# Patient Record
Sex: Female | Born: 1945 | Race: White | Hispanic: No | Marital: Married | Smoking: Never smoker
Health system: Southern US, Community
[De-identification: ages and names within clinical notes are randomized; demographics above are authoritative.]

## PROBLEM LIST (undated history)

## (undated) DIAGNOSIS — G4733 Obstructive sleep apnea (adult) (pediatric): Secondary | ICD-10-CM

## (undated) DIAGNOSIS — J449 Chronic obstructive pulmonary disease, unspecified: Secondary | ICD-10-CM

## (undated) DIAGNOSIS — G4730 Sleep apnea, unspecified: Secondary | ICD-10-CM

## (undated) DIAGNOSIS — E785 Hyperlipidemia, unspecified: Secondary | ICD-10-CM

## (undated) DIAGNOSIS — L501 Idiopathic urticaria: Secondary | ICD-10-CM

## (undated) DIAGNOSIS — K5730 Diverticulosis of large intestine without perforation or abscess without bleeding: Secondary | ICD-10-CM

## (undated) DIAGNOSIS — L309 Dermatitis, unspecified: Secondary | ICD-10-CM

## (undated) DIAGNOSIS — K589 Irritable bowel syndrome without diarrhea: Secondary | ICD-10-CM

## (undated) DIAGNOSIS — I10 Essential (primary) hypertension: Secondary | ICD-10-CM

## (undated) DIAGNOSIS — F419 Anxiety disorder, unspecified: Secondary | ICD-10-CM

## (undated) DIAGNOSIS — N289 Disorder of kidney and ureter, unspecified: Secondary | ICD-10-CM

## (undated) DIAGNOSIS — D649 Anemia, unspecified: Secondary | ICD-10-CM

## (undated) DIAGNOSIS — C4490 Unspecified malignant neoplasm of skin, unspecified: Secondary | ICD-10-CM

## (undated) DIAGNOSIS — M1990 Unspecified osteoarthritis, unspecified site: Secondary | ICD-10-CM

## (undated) DIAGNOSIS — F32A Depression, unspecified: Secondary | ICD-10-CM

## (undated) DIAGNOSIS — E0590 Thyrotoxicosis, unspecified without thyrotoxic crisis or storm: Secondary | ICD-10-CM

## (undated) DIAGNOSIS — F329 Major depressive disorder, single episode, unspecified: Secondary | ICD-10-CM

## (undated) DIAGNOSIS — K219 Gastro-esophageal reflux disease without esophagitis: Secondary | ICD-10-CM

## (undated) HISTORY — DX: Depression, unspecified: F32.A

## (undated) HISTORY — DX: Chronic obstructive pulmonary disease, unspecified: J44.9

## (undated) HISTORY — DX: Irritable bowel syndrome, unspecified: K58.9

## (undated) HISTORY — DX: Anemia, unspecified: D64.9

## (undated) HISTORY — DX: Idiopathic urticaria: L50.1

## (undated) HISTORY — DX: Unspecified malignant neoplasm of skin, unspecified: C44.90

## (undated) HISTORY — DX: Thyrotoxicosis, unspecified without thyrotoxic crisis or storm: E05.90

## (undated) HISTORY — DX: Diverticulosis of large intestine without perforation or abscess without bleeding: K57.30

## (undated) HISTORY — DX: Essential (primary) hypertension: I10

## (undated) HISTORY — DX: Disorder of kidney and ureter, unspecified: N28.9

## (undated) HISTORY — DX: Sleep apnea, unspecified: G47.30

## (undated) HISTORY — DX: Obstructive sleep apnea (adult) (pediatric): G47.33

## (undated) HISTORY — DX: Hyperlipidemia, unspecified: E78.5

## (undated) HISTORY — DX: Anxiety disorder, unspecified: F41.9

## (undated) HISTORY — DX: Major depressive disorder, single episode, unspecified: F32.9

## (undated) HISTORY — DX: Gastro-esophageal reflux disease without esophagitis: K21.9

---

## 2001-04-25 HISTORY — PX: REPLACEMENT TOTAL KNEE: SUR1224

## 2004-04-25 HISTORY — PX: COLONOSCOPY: SHX174

## 2005-04-25 HISTORY — PX: TOTAL ABDOMINAL HYSTERECTOMY W/ BILATERAL SALPINGOOPHORECTOMY: SHX83

## 2007-04-26 DIAGNOSIS — K5730 Diverticulosis of large intestine without perforation or abscess without bleeding: Secondary | ICD-10-CM

## 2007-04-26 HISTORY — DX: Diverticulosis of large intestine without perforation or abscess without bleeding: K57.30

## 2009-11-10 ENCOUNTER — Encounter: Payer: Self-pay | Admitting: Internal Medicine

## 2009-11-30 ENCOUNTER — Ambulatory Visit: Payer: Self-pay | Admitting: Internal Medicine

## 2009-11-30 DIAGNOSIS — E0590 Thyrotoxicosis, unspecified without thyrotoxic crisis or storm: Secondary | ICD-10-CM | POA: Insufficient documentation

## 2009-11-30 DIAGNOSIS — I10 Essential (primary) hypertension: Secondary | ICD-10-CM | POA: Insufficient documentation

## 2009-11-30 DIAGNOSIS — Z85828 Personal history of other malignant neoplasm of skin: Secondary | ICD-10-CM | POA: Insufficient documentation

## 2009-11-30 DIAGNOSIS — F3289 Other specified depressive episodes: Secondary | ICD-10-CM | POA: Insufficient documentation

## 2009-11-30 DIAGNOSIS — F329 Major depressive disorder, single episode, unspecified: Secondary | ICD-10-CM | POA: Insufficient documentation

## 2009-11-30 DIAGNOSIS — K5730 Diverticulosis of large intestine without perforation or abscess without bleeding: Secondary | ICD-10-CM | POA: Insufficient documentation

## 2009-11-30 DIAGNOSIS — J449 Chronic obstructive pulmonary disease, unspecified: Secondary | ICD-10-CM | POA: Insufficient documentation

## 2009-11-30 DIAGNOSIS — E785 Hyperlipidemia, unspecified: Secondary | ICD-10-CM | POA: Insufficient documentation

## 2009-11-30 DIAGNOSIS — J4489 Other specified chronic obstructive pulmonary disease: Secondary | ICD-10-CM | POA: Insufficient documentation

## 2009-11-30 DIAGNOSIS — K219 Gastro-esophageal reflux disease without esophagitis: Secondary | ICD-10-CM | POA: Insufficient documentation

## 2009-11-30 DIAGNOSIS — D509 Iron deficiency anemia, unspecified: Secondary | ICD-10-CM | POA: Insufficient documentation

## 2009-11-30 DIAGNOSIS — R7989 Other specified abnormal findings of blood chemistry: Secondary | ICD-10-CM | POA: Insufficient documentation

## 2009-11-30 DIAGNOSIS — J4520 Mild intermittent asthma, uncomplicated: Secondary | ICD-10-CM | POA: Insufficient documentation

## 2009-12-01 ENCOUNTER — Encounter: Payer: Self-pay | Admitting: Internal Medicine

## 2010-01-18 ENCOUNTER — Encounter: Payer: Self-pay | Admitting: Endocrinology

## 2010-01-18 ENCOUNTER — Encounter: Payer: Self-pay | Admitting: Internal Medicine

## 2010-02-09 ENCOUNTER — Encounter: Payer: Self-pay | Admitting: Internal Medicine

## 2010-03-17 ENCOUNTER — Ambulatory Visit: Payer: Self-pay | Admitting: Internal Medicine

## 2010-03-17 DIAGNOSIS — E1165 Type 2 diabetes mellitus with hyperglycemia: Secondary | ICD-10-CM

## 2010-03-17 DIAGNOSIS — IMO0001 Reserved for inherently not codable concepts without codable children: Secondary | ICD-10-CM | POA: Insufficient documentation

## 2010-05-03 ENCOUNTER — Telehealth (INDEPENDENT_AMBULATORY_CARE_PROVIDER_SITE_OTHER): Payer: Self-pay | Admitting: *Deleted

## 2010-05-25 NOTE — Assessment & Plan Note (Signed)
Summary: review lab/cbs   Vital Signs:  Patient profile:   65 year old female Weight:      255 pounds BMI:     41.00 Temp:     98.1 degrees F oral Pulse rate:   72 / minute Resp:     15 per minute BP sitting:   132 / 60  (left arm) Cuff size:   large  Vitals Entered By: Shonna Chock CMA (March 17, 2010 8:57 AM) CC: Follow-up visit: discuss labs (patient with copy) , Type 2 diabetes mellitus follow-up   CC:  Follow-up visit: discuss labs (patient with copy)  and Type 2 diabetes mellitus follow-up.  History of Present Illness: Type 2 Diabetes Mellitus Follow-Up      This is a 65 year old woman who presents for Type 2 diabetes mellitus follow-up.  The patient reports weight loss, but denies polyuria, polydipsia, blurred vision, and numbness of extremities.  The patient denies the following symptoms: neuropathic pain, chest pain, orthostatic symptoms, poor wound healing, vision loss, and foot ulcer.  Since the last visit the patient reports good dietary compliance, not exercising regularly, and not monitoring blood glucose.  Since the last visit, the patient reports having had eye care by an ophthalmologist, no retinopathy.    Current Medications (verified): 1)  Wellbutrin Sr 100 Mg Xr12h-Tab (Bupropion Hcl) .Marland Kitchen.. 1 By Mouth Once Daily 2)  Norvasc 5 Mg Tabs (Amlodipine Besylate) .Marland Kitchen.. 1 By Mouth Once Daily 3)  Pravastatin Sodium 40 Mg Tabs (Pravastatin Sodium) .Marland Kitchen.. 1 By Mouth At Bedtime 4)  Coreg 25 Mg Tabs (Carvedilol) .Marland Kitchen.. 1 By Mouth Two Times A Day 5)  Zantac 150 Mg Tabs (Ranitidine Hcl) .Marland Kitchen.. 1 By Mouth Two Times A Day 6)  Folic Acid 1 Mg Tabs (Folic Acid) .Marland Kitchen.. 1 By Mouth Once Daily 7)  Zestril 40 Mg Tabs (Lisinopril) .Marland Kitchen.. 1 By Mouth Once Daily 8)  Lantus 100 Unit/ml Soln (Insulin Glargine) .... 80 Units At Bedtime 9)  Novolog 100 Unit/ml Soln (Insulin Aspart) .... 4-8 Units Three Times A Day 10)  Aspirin 81 Mg Tabs (Aspirin) .Marland Kitchen.. 1 By Mouth Once Daily 11)  Lasix 20 Mg Tabs  (Furosemide) .... 1/2 By Mouth Once Daily 12)  Symlinpen 60 1000 Mcg/ml Soln (Pramlintide Acetate) .... Three Times A Day 13)  Uloric 40 Mg Tabs (Febuxostat) .Marland Kitchen.. 1 By Mouth Once Daily  Allergies: 1)  ! Pcn  Physical Exam  Lungs:  Normal respiratory effort, chest expands symmetrically. Lungs are clear to auscultation, no crackles or wheezes. Heart:  Normal rate and regular rhythm. S1 and S2 normal without gallop, murmur, click, rub or other extra sounds. Pulses:  R and L carotid,radial,dorsalis pedis and posterior tibial pulses are full and equal bilaterally. Faint L carotid bruit Extremities:  Good nail health Neurologic:  alert & oriented X3 and sensation intact to light touch over feet.   Skin:  Intact without suspicious lesions or rashes Psych:  memory intact for recent and remote, normally interactive, and good eye contact.     Impression & Recommendations:  Problem # 1:  DIABETES MELLITUS, UNCONTROLLED (ICD-250.02)  Her updated medication list for this problem includes:    Zestril 40 Mg Tabs (Lisinopril) .Marland Kitchen... 1 by mouth once daily    Lantus 100 Unit/ml Soln (Insulin glargine) .Marland KitchenMarland KitchenMarland KitchenMarland Kitchen 80 units at bedtime    Novolog 100 Unit/ml Soln (Insulin aspart) .Marland KitchenMarland KitchenMarland KitchenMarland Kitchen 4-8 units three times a day    Aspirin 81 Mg Tabs (Aspirin) .Marland Kitchen... 1 by mouth once daily  Symlinpen 60 1000 Mcg/ml Soln (Pramlintide acetate) .Marland Kitchen... Three times a day  Complete Medication List: 1)  Wellbutrin Sr 100 Mg Xr12h-tab (Bupropion hcl) .Marland Kitchen.. 1 by mouth once daily 2)  Norvasc 5 Mg Tabs (Amlodipine besylate) .Marland Kitchen.. 1 by mouth once daily 3)  Pravastatin Sodium 40 Mg Tabs (Pravastatin sodium) .Marland Kitchen.. 1 by mouth at bedtime 4)  Coreg 25 Mg Tabs (Carvedilol) .Marland Kitchen.. 1 by mouth two times a day 5)  Zantac 150 Mg Tabs (Ranitidine hcl) .Marland Kitchen.. 1 by mouth two times a day 6)  Folic Acid 1 Mg Tabs (Folic acid) .Marland Kitchen.. 1 by mouth once daily 7)  Zestril 40 Mg Tabs (Lisinopril) .Marland Kitchen.. 1 by mouth once daily 8)  Lantus 100 Unit/ml Soln (Insulin  glargine) .... 80 units at bedtime 9)  Novolog 100 Unit/ml Soln (Insulin aspart) .... 4-8 units three times a day 10)  Aspirin 81 Mg Tabs (Aspirin) .Marland Kitchen.. 1 by mouth once daily 11)  Lasix 20 Mg Tabs (Furosemide) .... 1/2 by mouth once daily 12)  Symlinpen 60 1000 Mcg/ml Soln (Pramlintide acetate) .... Three times a day 13)  Uloric 40 Mg Tabs (Febuxostat) .Marland Kitchen.. 1 by mouth once daily  Patient Instructions: 1)  Less THAN 30 grams of HFCS sugar/ day. 2)  Please schedule a follow-up appointment in 3 months. 3)  Check your blood sugars regularly. If your readings are usually above : 150 or below90  & > 180 two after any meal you should contact our office. 4)  Check your feet each night for sore areas, calluses or signs of infection. 5)  HbgA1C prior to visit, ICD-9:250.02 6)  Urine Microalbumin prior to visit, ICD-9:250.02   Orders Added: 1)  Est. Patient Level III [16109]

## 2010-05-25 NOTE — Assessment & Plan Note (Signed)
Summary: new to est/kn   Vital Signs:  Patient profile:   65 year old female Height:      66.25 inches Weight:      260.4 pounds BMI:     41.86 Temp:     97.9 degrees F oral Pulse rate:   60 / minute Resp:     16 per minute BP sitting:   124 / 80  (left arm) Cuff size:   large  Vitals Entered By: Shonna Chock CMA (November 30, 2009 4:01 PM)  CC: 1.) New Patient Establish: CPX , General Medical Evaluation   CC:  1.) New Patient Establish: CPX  and General Medical Evaluation.  History of Present Illness: Valerie Stevenson is moving from [place] , [place]  back to [place] their home.  She had labs done 3 weeks ago. A1c was 7.4%; WBC ? 14.4 & uric acid 8.5.  Preventive Screening-Counseling & Management  Alcohol-Tobacco     Smoking Status: never  Caffeine-Diet-Exercise     Does Patient Exercise: no  Current Medications (verified): 1)  Wellbutrin Sr 100 Mg Xr12h-Tab (Bupropion Hcl) .Marland Kitchen.. 1 By Mouth Once Daily 2)  Norvasc 5 Mg Tabs (Amlodipine Besylate) .Marland Kitchen.. 1 By Mouth Once Daily 3)  Pravastatin Sodium 40 Mg Tabs (Pravastatin Sodium) .Marland Kitchen.. 1 By Mouth At Bedtime 4)  Coreg 25 Mg Tabs (Carvedilol) .Marland Kitchen.. 1 By Mouth Two Times A Day 5)  Zantac 150 Mg Tabs (Ranitidine Hcl) .Marland Kitchen.. 1 By Mouth Two Times A Day 6)  Tapazole 5 Mg Tabs (Methimazole) .Marland Kitchen.. 1 By Mouth Once Daily 7)  Folic Acid 1 Mg Tabs (Folic Acid) .Marland Kitchen.. 1 By Mouth Once Daily 8)  Zestril 40 Mg Tabs (Lisinopril) .Marland Kitchen.. 1 By Mouth Once Daily 9)  Lantus 100 Unit/ml Soln (Insulin Glargine) .... 80 Units At Bedtime 10)  Novolog 100 Unit/ml Soln (Insulin Aspart) .... 4-8 Units Three Times A Day 11)  Aspirin 81 Mg Tabs (Aspirin) .Marland Kitchen.. 1 By Mouth Once Daily  Allergies (verified): 1)  ! Pcn  Past History:  Past Medical History: Sleep Apnea , CPAP; Glaucoma; Renal  Disease, PMH of , creat 1.8 post op Anemia-iron deficiency Asthma/COPD( Chronic Bronchitis) Depression, PMH of  Diverticulosis, colon GERD Hyperlipidemia Hypertension Hyperthyroidism in  context of nodule, Tapazole Rx since 2007 Skin cancer, hx of, Basal Cell Diabetes mellitus, type II  Past Surgical History: G 3 P 2 Hysterectomy & BSO  2008( twin had ovarian cancer) Total knee replacement L 2005  Family History: Father: CAD, DUD Mother: CVA, DM Siblings: twin :ovarian cancer; MGF : alcoholism  Social History: Occupation: Charity fundraiser  Married Never Smoked Alcohol use-yes: RARE Regular exercise-no Smoking Status:  never Does Patient Exercise:  no  Review of Systems       The patient complains of dyspnea on exertion and peripheral edema.  The patient denies anorexia, fever, weight loss, weight gain, vision loss, decreased hearing, hoarseness, chest pain, syncope, prolonged cough, headaches, hemoptysis, abdominal pain, melena, hematochezia, severe indigestion/heartburn, hematuria, incontinence, suspicious skin lesions, unusual weight change, abnormal bleeding, enlarged lymph nodes, and angioedema.    Physical Exam  General:  well-nourished; alert,appropriate and cooperative throughout examination Head:  Normocephalic and atraumatic without obvious abnormalities.  Eyes:  No corneal or conjunctival inflammation noted.Perrla. Funduscopic exam benign, without hemorrhages, exudates or papilledema.  Ears:  External ear exam shows no significant lesions or deformities.  Otoscopic examination reveals clear canals, tympanic membranes are intact bilaterally without bulging, retraction, inflammation or discharge. Hearing is grossly normal bilaterally. Nose:  External  nasal examination shows no deformity or inflammation. Nasal mucosa are pink and moist without lesions or exudates. Mouth:  Oral mucosa and oropharynx without lesions or exudates.  Teeth in good repair. Neck:  No deformities, masses, or tenderness noted. R thyroid > L Lungs:  Normal respiratory effort, chest expands symmetrically. Lungs are clear to auscultation, no crackles or wheezes. Heart:  normal rate, regular rhythm,  no gallop, no rub, no JVD, no HJR, and grade 1 /6 systolic murmur.   Abdomen:  Bowel sounds positive,abdomen soft and non-tender without masses, organomegaly or hernias noted.Abdomen protuberant Msk:  No deformity or scoliosis noted of thoracic or lumbar spine.   Pulses:  R and L carotid,radial,dorsalis pedis and posterior tibial pulses are full and equal bilaterally Extremities:  No clubbing, cyanosis, edema, or deformity noted with normal full range of motion of all joints.   Neurologic:  alert & oriented X3 and DTRs symmetrical and normal.   Skin:  Intact without suspicious lesions or rashes Cervical Nodes:  No lymphadenopathy noted Axillary Nodes:  No palpable lymphadenopathy Psych:  memory intact for recent and remote, normally interactive, and good eye contact.     Impression & Recommendations:  Problem # 1:  ROUTINE GENERAL MEDICAL EXAM@HEALTH  CARE FACL (ICD-V70.0)  Orders: EKG w/ Interpretation (93000)  Problem # 2:  DIABETES MELLITUS, TYPE II (ICD-250.00)  Her updated medication list for this problem includes:    Zestril 40 Mg Tabs (Lisinopril) .Marland Kitchen... 1 by mouth once daily    Lantus 100 Unit/ml Soln (Insulin glargine) .Marland KitchenMarland KitchenMarland KitchenMarland Kitchen 80 units at bedtime    Novolog 100 Unit/ml Soln (Insulin aspart) .Marland KitchenMarland KitchenMarland KitchenMarland Kitchen 4-8 units three times a day    Aspirin 81 Mg Tabs (Aspirin) .Marland Kitchen... 1 by mouth once daily    Symlinpen 120 1000 Mcg/ml Soln (Pramlintide acetate) .Marland Kitchen... three times a day  Problem # 3:  HYPERTHYROIDISM (ICD-242.90)  Her updated medication list for this problem includes:    Coreg 25 Mg Tabs (Carvedilol) .Marland Kitchen... 1 by mouth two times a day    Tapazole 5 Mg Tabs (Methimazole) .Marland Kitchen... 1 by mouth once daily  Problem # 4:  HYPERTENSION (ICD-401.9) Controlled Her updated medication list for this problem includes:    Norvasc 5 Mg Tabs (Amlodipine besylate) .Marland Kitchen... 1 by mouth once daily    Coreg 25 Mg Tabs (Carvedilol) .Marland Kitchen... 1 by mouth two times a day    Zestril 40 Mg Tabs (Lisinopril) .Marland Kitchen...  1 by mouth once daily    Lasix 20 Mg Tabs (Furosemide) .Marland Kitchen... 1/2 by mouth once daily  Problem # 5:  HYPERLIPIDEMIA (ICD-272.4)  Her updated medication list for this problem includes:    Pravastatin Sodium 40 Mg Tabs (Pravastatin sodium) .Marland Kitchen... 1 by mouth at bedtime  Problem # 6:  HYPERURICEMIA (ICD-790.6)  Complete Medication List: 1)  Wellbutrin Sr 100 Mg Xr12h-tab (Bupropion hcl) .Marland Kitchen.. 1 by mouth once daily 2)  Norvasc 5 Mg Tabs (Amlodipine besylate) .Marland Kitchen.. 1 by mouth once daily 3)  Pravastatin Sodium 40 Mg Tabs (Pravastatin sodium) .Marland Kitchen.. 1 by mouth at bedtime 4)  Coreg 25 Mg Tabs (Carvedilol) .Marland Kitchen.. 1 by mouth two times a day 5)  Zantac 150 Mg Tabs (Ranitidine hcl) .Marland Kitchen.. 1 by mouth two times a day 6)  Tapazole 5 Mg Tabs (Methimazole) .Marland Kitchen.. 1 by mouth once daily 7)  Folic Acid 1 Mg Tabs (Folic acid) .Marland Kitchen.. 1 by mouth once daily 8)  Zestril 40 Mg Tabs (Lisinopril) .Marland Kitchen.. 1 by mouth once daily 9)  Lantus 100 Unit/ml  Soln (Insulin glargine) .... 80 units at bedtime 10)  Novolog 100 Unit/ml Soln (Insulin aspart) .... 4-8 units three times a day 11)  Aspirin 81 Mg Tabs (Aspirin) .Marland Kitchen.. 1 by mouth once daily 12)  Lasix 20 Mg Tabs (Furosemide) .... 1/2 by mouth once daily 13)  Symlinpen 120 1000 Mcg/ml Soln (Pramlintide acetate) .Marland Kitchen.. three times a day  Patient Instructions: 1)  Consume < 30 grams of HFCS sugar/ day as discussed. 2)  Please schedule a follow-up appointment in 3 months. Please obtain recent lab results. 3)  Lipid Panel prior to visit, ICD-9:272.4 4)  HbgA1C prior to visit, ICD-9:250.02   Immunization History:  Tetanus/Td Immunization History:    Tetanus/Td:  historical (09/23/2009)   Appended Document: new to est/kn    Prescriptions: LAB ORDER Lipid 272.4 A1c 250.02  #1 x 0   Entered by:   Shonna Chock CMA   Authorized by:   Marga Melnick MD   Signed by:   Shonna Chock CMA on 11/30/2009   Method used:   Print then Give to Patient   RxID:    [id number]   patient will get labs at her job, patient works at employee [employer]./[facility] CMA  November 30, 2009 4:58 PM

## 2010-05-25 NOTE — Letter (Signed)
Summary: Deirdre Evener OD  Deirdre Evener OD   Imported By: Lanelle Bal 03/24/2010 12:51:18  _____________________________________________________________________  External Attachment:    Type:   Image     Comment:   External Document

## 2010-05-27 NOTE — Progress Notes (Signed)
Summary: Refill request   Phone Note Refill Request Call back at Home Phone [phone] Call back at [phone] Message from:  Patient on May 03, 2010 9:18 AM  Refills Requested: Medication #1:  COREG 25 MG TABS 1 by mouth two times a day   Dosage confirmed as above?Dosage Confirmed   Supply Requested: 3 months  Medication #2:  WELLBUTRIN SR 100 MG XR12H-TAB 1 by mouth once daily   Dosage confirmed as above?Dosage Confirmed   Supply Requested: 3 months  Medication #3:  LASIX 20 MG TABS 1/2 by mouth once daily   Dosage confirmed as above?Dosage Confirmed   Supply Requested: 3 months WLL PICK UP WHEN READY  Initial call taken by: Freddy Jaksch,  May 03, 2010 9:18 AM    Prescriptions: LASIX 20 MG TABS (FUROSEMIDE) 1/2 by mouth once daily  #45 x 2   Entered by:   Shonna Chock CMA   Authorized by:   Marga Melnick MD   Signed by:   Shonna Chock CMA on 05/03/2010   Method used:   Print then Give to Patient   RxID:   [id number] COREG 25 MG TABS (CARVEDILOL) 1 by mouth two times a day  #180 x 2   Entered by:   Shonna Chock CMA   Authorized by:   Marga Melnick MD   Signed by:   Shonna Chock CMA on 05/03/2010   Method used:   Print then Give to Patient   RxID:   [id number] WELLBUTRIN SR 100 MG XR12H-TAB (BUPROPION HCL) 1 by mouth once daily  #90 x 2   Entered by:   Shonna Chock CMA   Authorized by:   Marga Melnick MD   Signed by:   Shonna Chock CMA on 05/03/2010   Method used:   Print then Give to Patient   RxID:   [id number]

## 2010-05-31 ENCOUNTER — Telehealth: Payer: Self-pay | Admitting: Internal Medicine

## 2010-06-04 ENCOUNTER — Encounter: Payer: Self-pay | Admitting: Internal Medicine

## 2010-06-04 ENCOUNTER — Telehealth: Payer: Self-pay | Admitting: Internal Medicine

## 2010-06-04 ENCOUNTER — Ambulatory Visit (INDEPENDENT_AMBULATORY_CARE_PROVIDER_SITE_OTHER): Payer: No Typology Code available for payment source | Admitting: Internal Medicine

## 2010-06-04 DIAGNOSIS — E785 Hyperlipidemia, unspecified: Secondary | ICD-10-CM

## 2010-06-04 DIAGNOSIS — E0590 Thyrotoxicosis, unspecified without thyrotoxic crisis or storm: Secondary | ICD-10-CM

## 2010-06-04 DIAGNOSIS — R5383 Other fatigue: Secondary | ICD-10-CM | POA: Insufficient documentation

## 2010-06-04 DIAGNOSIS — R5381 Other malaise: Secondary | ICD-10-CM

## 2010-06-04 DIAGNOSIS — IMO0001 Reserved for inherently not codable concepts without codable children: Secondary | ICD-10-CM

## 2010-06-04 DIAGNOSIS — I10 Essential (primary) hypertension: Secondary | ICD-10-CM

## 2010-06-07 ENCOUNTER — Telehealth: Payer: Self-pay | Admitting: Internal Medicine

## 2010-06-07 ENCOUNTER — Encounter: Payer: Self-pay | Admitting: Internal Medicine

## 2010-06-07 LAB — CONVERTED CEMR LAB
Albumin: 4.2 g/dL (ref 3.5–5.2)
BUN: 40 mg/dL — ABNORMAL HIGH (ref 6–23)
Free T4: 1.88 ng/dL — ABNORMAL HIGH (ref 0.80–1.80)
Potassium: 5 meq/L (ref 3.5–5.3)
TSH: 0.01 microintl units/mL — ABNORMAL LOW (ref 0.350–4.500)
Total Bilirubin: 0.5 mg/dL (ref 0.3–1.2)
Total CK: 44 units/L (ref 7–177)
Total Protein: 6.5 g/dL (ref 6.0–8.3)

## 2010-06-10 NOTE — Assessment & Plan Note (Signed)
Summary: C/O extreme fatigue and discuss being off Thyroid med/kb   Vital Signs:  Patient profile:   65 year old female Weight:      254.8 pounds BMI:     40.96 Temp:     98.4 degrees F oral Pulse rate:   80 / minute Resp:     15 per minute BP sitting:   140 / 70  (left arm) Cuff size:   large  Vitals Entered By: Shonna Chock CMA (June 04, 2010 3:14 PM) CC: Fatigue since 03/2010, patient would like TSH checked and RX for Symlipen   CC:  Fatigue since 03/2010 and patient would like TSH checked and RX for Symlipen.  History of Present Illness:      This is a 65 year old woman who presents with Fatigue for 2-3 months.  The patient reports persistent fatigue, fatigue with minimal exertion, and primarily physical fatigue.  The patient denies fever, night sweats, weight loss, exertional chest pain, dyspnea, cough, and hemoptysis.  Other symptoms include skin changes, dryness .Derm diagnosed age related changes. No hair loss or nail changes.  The patient denies the following symptoms: leg swelling, orthopnea, PND, melena ( stool cards negative @ Gyn 01/12), adenopathy, severe snoring, and daytime sleepiness. She is on CPAP with Sleep Apnea control..She is on Wellbutrin for depressive symptoms.  The patient denies altered appetite and poor sleep.   She was on Tapazole for several years for hyperthyroidism unti 08/11.  Allergies: 1)  ! Pcn  Past History:  Past Medical History: Sleep Apnea , CPAP; Glaucoma; Renal  Disease, PMH of , creat 1.8 post op Anemia-iron deficiency Asthma/COPD( Chronic Bronchitis) Depression, PMH of  Diverticulosis, colon GERD Hyperlipidemia Hypertension Hyperthyroidism in context of nodule, Tapazole therapy  since 2007 Skin cancer, hx of, Basal Cell Diabetes mellitus, type II  Review of Systems Neuro:  Pain R posterior thorax @ site of zoster 2009.  Physical Exam  General:  in no acute distress; alert,appropriate and cooperative throughout  examination Eyes:  No corneal or conjunctival inflammation noted. No lid lag Neck:  No deformities, masses, or tenderness noted. Lipomatous changes anterior neck Lungs:  Normal respiratory effort, chest expands symmetrically. Lungs are clear to auscultation, no crackles or wheezes. Heart:  normal rate, regular rhythm, no gallop, no rub, no JVD, no HJR, and grade 1 /6 systolic murmur.   Abdomen:  Bowel sounds positive,abdomen soft and non-tender without masses, organomegaly or hernias noted. Protuberant Pulses:  R and L carotid,radial,dorsalis pedis and posterior tibial pulses are full and equal bilaterally Extremities:  No clubbing, cyanosis, edema, or deformity noted with normal full range of motion of all joints.   No tremor Neurologic:  alert & oriented X3 and DTRs symmetrical and 1/2 +. No tremor  Skin:  Intact without suspicious lesions or rashes. Excoriations over shin; no scleroderma Cervical Nodes:  No lymphadenopathy noted Axillary Nodes:  No palpable lymphadenopathy Psych:  memory intact for recent and remote, flat affect, and subdued.     Impression & Recommendations:  Problem # 1:  FATIGUE (ICD-780.79)  Orders: Venipuncture (16109) TLB-CBC Platelet - w/Differential (85025-CBCD) TLB-TSH (Thyroid Stimulating Hormone) (84443-TSH) TLB-T4 (Thyrox), Free [phone] TLB-T3, Free (Triiodothyronine) (84481-T3FREE)  Problem # 2:  HYPERTHYROIDISM (ICD-242.90)  Her updated medication list for this problem includes:    Coreg 25 Mg Tabs (Carvedilol) .Marland Kitchen... 1 by mouth two times a day  Orders: Venipuncture (19147) TLB-TSH (Thyroid Stimulating Hormone) (84443-TSH) TLB-T4 (Thyrox), Free [phone]) TLB-T3, Free (Triiodothyronine) (84481-T3FREE)  Problem # 3:  DIABETES MELLITUS, UNCONTROLLED (ICD-250.02)  Her updated medication list for this problem includes:    Zestril 40 Mg Tabs (Lisinopril) .Marland Kitchen... 1 by mouth once daily    Lantus 100 Unit/ml Soln (Insulin glargine) .Marland KitchenMarland KitchenMarland KitchenMarland Kitchen 80 units  at bedtime    Aspirin 81 Mg Tabs (Aspirin) .Marland Kitchen... 1 by mouth once daily    Symlinpen 60 1000 Mcg/ml Soln (Pramlintide acetate) .Marland Kitchen... Three times a day  Orders: Venipuncture (16109) TLB-A1C / Hgb A1C (Glycohemoglobin) (83036-A1C) TLB-Microalbumin/Creat Ratio, Urine (82043-MALB)  Problem # 4:  HYPERTENSION (ICD-401.9)  Her updated medication list for this problem includes:    Norvasc 5 Mg Tabs (Amlodipine besylate) .Marland Kitchen... 1 by mouth once daily    Coreg 25 Mg Tabs (Carvedilol) .Marland Kitchen... 1 by mouth two times a day    Zestril 40 Mg Tabs (Lisinopril) .Marland Kitchen... 1 by mouth once daily    Lasix 20 Mg Tabs (Furosemide) .Marland Kitchen... 1/2 by mouth once daily  Orders: TLB-Creatinine, Blood (82565-CREA) TLB-Potassium (K+) (84132-K) TLB-BUN (Urea Nitrogen) (84520-BUN)  Problem # 5:  HYPERLIPIDEMIA (ICD-272.4)  Her updated medication list for this problem includes:    Pravastatin Sodium 40 Mg Tabs (Pravastatin sodium) .Marland Kitchen... 1 by mouth at bedtime  Orders: Venipuncture (60454) TLB-Hepatic/Liver Function Pnl (80076-HEPATIC)  Complete Medication List: 1)  Wellbutrin Sr 100 Mg Xr12h-tab (Bupropion hcl) .Marland Kitchen.. 1 by mouth once daily 2)  Norvasc 5 Mg Tabs (Amlodipine besylate) .Marland Kitchen.. 1 by mouth once daily 3)  Pravastatin Sodium 40 Mg Tabs (Pravastatin sodium) .Marland Kitchen.. 1 by mouth at bedtime 4)  Coreg 25 Mg Tabs (Carvedilol) .Marland Kitchen.. 1 by mouth two times a day 5)  Zantac 150 Mg Tabs (Ranitidine hcl) .Marland Kitchen.. 1 by mouth two times a day 6)  Folic Acid 1 Mg Tabs (Folic acid) .Marland Kitchen.. 1 by mouth once daily 7)  Zestril 40 Mg Tabs (Lisinopril) .Marland Kitchen.. 1 by mouth once daily 8)  Lantus 100 Unit/ml Soln (Insulin glargine) .... 80 units at bedtime 9)  Novolog 100 Unit/ml Soln Flex Pen  .Marland Kitchen.. 2-4  units three times a day 10)  Aspirin 81 Mg Tabs (Aspirin) .Marland Kitchen.. 1 by mouth once daily 11)  Lasix 20 Mg Tabs (Furosemide) .... 1/2 by mouth once daily 12)  Symlinpen 60 1000 Mcg/ml Soln (Pramlintide acetate) .... Three times a day 13)  Uloric 40 Mg Tabs  (Febuxostat) .Marland Kitchen.. 1 by mouth once daily  Patient Instructions: 1)  Check your blood sugars regularly. If your readings are usually above :150  or below 90 you should contact our office. 2)  See your eye doctor yearly to check for diabetic eye damage. 3)  Check your feet each night for sore areas, calluses or signs of infection. 4)  Check your Blood Pressure regularly. If it is above: 140/90 OVER AVERAGE  you should make an appointment. Prescriptions: NOVOLOG 100 UNIT/ML SOLN  FLEX PEN 2-4  units three times a day  #1 box x 1   Entered and Authorized by:   Marga Melnick MD   Signed by:   Marga Melnick MD on 06/04/2010   Method used:   Faxed to ...       [address] [phone]* (retail)       [address] [place], [place]  [address]       Ph: [phone]       Fax: [phone]   RxID:   [id number] SYMLINPEN 60 1000 MCG/ML SOLN (PRAMLINTIDE ACETATE) three times a day  #1 box x 1  Entered and Authorized by:   Marga Melnick MD   Signed by:   Marga Melnick MD on 06/04/2010   Method used:   Electronically to        [facility] [phone]* (retail)       [address] [place], [place]  [address]       Ph: [phone]       Fax: [phone]   RxID:   [id number]    Orders Added: 1)  Est. Patient Level IV [24401] 2)  Venipuncture [02725] 3)  TLB-CBC Platelet - w/Differential [85025-CBCD] 4)  TLB-TSH (Thyroid Stimulating Hormone) [84443-TSH] 5)  TLB-Hepatic/Liver Function Pnl [80076-HEPATIC] 6)  TLB-Creatinine, Blood [82565-CREA] 7)  TLB-Potassium (K+) [84132-K] 8)  TLB-BUN (Urea Nitrogen) [84520-BUN] 9)  TLB-T4 (Thyrox), Free [36644-IH4V] 10)  TLB-T3, Free (Triiodothyronine) [84481-T3FREE] 11)  TLB-A1C / Hgb A1C (Glycohemoglobin) [83036-A1C] 12)  TLB-Microalbumin/Creat Ratio, Urine [82043-MALB]  Appended Document: C/O extreme fatigue and discuss being off Thyroid med/kb

## 2010-06-10 NOTE — Progress Notes (Signed)
Summary: Refill/lab add on  Phone Note Refill Request Message from:  Patient on May 31, 2010 11:33 AM  Refills Requested: Medication #1:  LANTUS 100 UNIT/ML SOLN 80 units at bedtime Patient also notes that she has been off her thyroid med since december and would like it checked at her lab appt also. Patient notes that she forgot to tell the MD at previous visit.  Initial call taken by: Lucious Groves CMA,  May 31, 2010 11:33 AM    Prescriptions: LANTUS 100 UNIT/ML SOLN (INSULIN GLARGINE) 80 units at bedtime  #1 month x 1   Entered by:   Lucious Groves CMA   Authorized by:   Marga Melnick MD   Signed by:   Lucious Groves CMA on 05/31/2010   Method used:   Electronically to        [facility] [phone]* (retail)       [address] [place], [place]  [address]       Ph: [phone]       Fax: [phone]   RxID:   [id number]

## 2010-06-10 NOTE — Progress Notes (Signed)
Summary: med clarification  Phone Note Refill Request Message from:  Pharmacy on June 04, 2010 3:57 PM  Refills Requested: Medication #1:  SYMLINPEN 60 1000 MCG/ML SOLN [facility] [place] needs more specific instructions on the above. Please advise.  Initial call taken by: Lucious Groves CMA,  June 04, 2010 3:57 PM  Follow-up for Phone Call        Per MD:  The patient was seen in office and this is how she described the med. so that is how it was sent.  I called the patient for more info on the med and left message on machine to call back to office.   **Per the pharmacy this is not interchangeable with Lantus. Lucious Groves CMA  June 04, 2010 4:39 PM   Additional Follow-up for Phone Call Additional follow up Details #1::        Per patient she has a pen that can be dialed to 120 or 60. She notes that she uses 60 but needs the remained available just in case. Patient states that prescription has to be written for pen and inject 60 units as directed. RX sent. Lucious Groves CMA  June 04, 2010 5:01 PM     New/Updated Medications: SYMLINPEN 60 1000 MCG/ML SOLN (PRAMLINTIDE ACETATE) 120 mcg pen, inject 60 units as directed Prescriptions: SYMLINPEN 60 1000 MCG/ML SOLN (PRAMLINTIDE ACETATE) 120 mcg pen, inject 60 units as directed  #1 box x 1   Entered by:   Lucious Groves CMA   Authorized by:   Marga Melnick MD   Signed by:   Lucious Groves CMA on 06/04/2010   Method used:   Electronically to        [facility] [phone]* (retail)       [address] [place], [place]  [address]       Ph: [phone]       Fax: [phone]   RxID:   [id number]

## 2010-06-14 ENCOUNTER — Telehealth: Payer: Self-pay | Admitting: Internal Medicine

## 2010-06-16 NOTE — Progress Notes (Signed)
Summary: Insulin pen qty  Phone Note Refill Request Message from:  Pharmacy on June 07, 2010 11:36 AM  Refills Requested: Medication #1:  SYMLINPEN 60 1000 MCG/ML SOLN 120 mcg pen I clarified this prescription with the patient last week, is it ok to give qty of 2 pens? Please advise.   Initial call taken by: Lucious Groves CMA,  June 07, 2010 11:37 AM  Follow-up for Phone Call        yes Follow-up by: Marga Melnick MD,  June 07, 2010 1:11 PM    Prescriptions: SYMLINPEN 60 1000 MCG/ML SOLN (PRAMLINTIDE ACETATE) 120 mcg pen, inject 60 units as directed  #2 x 1   Entered by:   Lucious Groves CMA   Authorized by:   Marga Melnick MD   Signed by:   Lucious Groves CMA on 06/07/2010   Method used:   Electronically to        [facility] [phone]* (retail)       [address] [place], [place]  [address]       Ph: [phone]       Fax: [phone]   RxID:   [id number]

## 2010-06-17 ENCOUNTER — Other Ambulatory Visit: Payer: Self-pay

## 2010-06-22 ENCOUNTER — Ambulatory Visit (INDEPENDENT_AMBULATORY_CARE_PROVIDER_SITE_OTHER): Payer: Medicare Other | Admitting: Endocrinology

## 2010-06-22 ENCOUNTER — Encounter: Payer: Self-pay | Admitting: Endocrinology

## 2010-06-22 ENCOUNTER — Other Ambulatory Visit: Payer: Self-pay | Admitting: Endocrinology

## 2010-06-22 ENCOUNTER — Telehealth: Payer: Self-pay | Admitting: Endocrinology

## 2010-06-22 DIAGNOSIS — E0590 Thyrotoxicosis, unspecified without thyrotoxic crisis or storm: Secondary | ICD-10-CM

## 2010-06-22 DIAGNOSIS — E119 Type 2 diabetes mellitus without complications: Secondary | ICD-10-CM

## 2010-06-22 NOTE — Progress Notes (Signed)
Summary: Lab Results   Phone Note Outgoing Call Call back at [facility] Phone [phone]   Call placed by: Shonna Chock CMA,  June 07, 2010 4:04 PM Call placed to: Patient Summary of Call: Shonna Chock CMA  June 07, 2010 4:05 PM  Left message for patient to return call when avaliable: Fatigue can be a presentation of angina in women , especially if Diabetes is uncontrolled. Your cardiac enzymes are normal.The usual A1c goal is < 7%;an A1c of  8% has a 60 % increased cardiovascular risk long term. Hyperthyroidism is present ; I believe you would be best served by thyroid ablation rather than long term Tapazole . To assess the Diabetes control  (large insulin doses are required due to resistance as we discussed) & the optimal therapy for the hyperthyroidism , I recommend an Endocrinoly consult. Do you have a preference? Levester Fresh CMA  June 07, 2010 4:05 PM   Follow-up for Phone Call        see referral order Follow-up by: Marga Melnick MD,  June 08, 2010 6:06 AM  Additional Follow-up for Phone Call Additional follow up Details #1::        Left message on machine for patient to return call when avaliable, Reason for call:   discuss labs  Additional Follow-up by: Shonna Chock CMA,  June 08, 2010 11:03 AM    Additional Follow-up for Phone Call Additional follow up Details #2::    Left message on machine for patient to return call when avaliable, Reason for call: Discuss lab results./Chrae [facility] CMA  June 09, 2010 10:43 AM   Patient notified of the above and referring to Dr. Everardo All is ok. Lucious Groves CMA  June 14, 2010 9:07 AM

## 2010-06-22 NOTE — Progress Notes (Signed)
Summary: clarify quantity  Phone Note Refill Request Message from:  Fax from Pharmacy on June 14, 2010 2:07 PM  Refills Requested: Medication #1:  inject 60 units as directed [facility] note from pharmacy - can you clarify what quantity you want patient to have   Initial call taken by: Okey Regal [place],  June 14, 2010 2:09 PM  Follow-up for Phone Call        please clarify with her present dose; this was Rxed by her prior MD before she moved here Follow-up by: Marga Melnick MD,  June 14, 2010 4:42 PM  Additional Follow-up for Phone Call Additional follow up Details #1::        This has already been done. Per pharmacy 2 pens come in one box and they were notified that it is ok to give one box. Lucious Groves CMA  June 14, 2010 4:57 PM  Additional Follow-up by: Lucious Groves CMA,  June 14, 2010 4:57 PM

## 2010-06-22 NOTE — Progress Notes (Signed)
Summary: Labs  Phone Note Call from Patient   Summary of Call: Patient has an appt on Thursday for --->hbga1c-malb:250.02/tsh 242.90  Valerie Stevenson. Patient just had labs drawn on 2/10, please confirm that she needs the above right now. Lucious Groves CMA  June 14, 2010 9:14 AM   Follow-up for Phone Call        see 02/10; no labs needed. Endocrinology referral needed for hyperthyroidism & uncontrolled DM Follow-up by: Marga Melnick MD,  June 14, 2010 9:45 AM  Additional Follow-up for Phone Call Additional follow up Details #1::        Patient notified and appt cx. Lucious Groves CMA  June 14, 2010 9:57 AM

## 2010-06-24 ENCOUNTER — Ambulatory Visit: Payer: Medicare Other | Admitting: Internal Medicine

## 2010-06-24 ENCOUNTER — Encounter: Payer: Self-pay | Admitting: Internal Medicine

## 2010-06-24 DIAGNOSIS — N259 Disorder resulting from impaired renal tubular function, unspecified: Secondary | ICD-10-CM | POA: Insufficient documentation

## 2010-07-01 NOTE — Assessment & Plan Note (Signed)
Summary: 3 MONTH ROV/CBS   Vital Signs:  Patient profile:   65 year old female Weight:      251.4 pounds Pulse rate:   75 / minute Resp:     16 per minute BP sitting:   132 / 60  (left arm) Cuff size:   large  Vitals Entered By: Shonna Chock CMA (June 24, 2010 10:28 AM)  CC:  Dysuria.  History of Present Illness:    Dr George Hugh note reviewed ; glucose was 151 pre dinner last night & this am with changes in insulin. Thyroid scan to be scheduled as prelude to possible RAI.131 (?). Creatinine 1.8 ; it was 1.8 after TAH in 2008 but it dropped to 1.4- 1.5 range subsequently. She required 2 units of blood @ TAH.She has been  seen  by Nephrologist in [place] on several occasions. An MRI of kidneys was WNL 2005.She  denies burning with urination, urinary frequency,hesitancy, and hematuria.  She has had some  nausea w/o vomiting  intermittently X 1 month.    Allergies: 1)  ! Pcn  Physical Exam  General:  in no acute distress; alert,appropriate and cooperative throughout examination Abdomen:  Bowel sounds positive,abdomen soft and non-tender without masses, organomegaly  or bruits  Msk:  No flank tenderness Pulses:  R and L dorsalis pedis and posterior tibial pulses are full and equal bilaterally Skin:  Intact without suspicious lesions or rashes   Impression & Recommendations:  Problem # 1:  RENAL INSUFFICIENCY (ICD-588.9)  Problem # 2:  DIABETES MELLITUS, UNCONTROLLED (ICD-250.02) as per Dr Everardo All Her updated medication list for this problem includes:    Zestril 40 Mg Tabs (Lisinopril) .Marland Kitchen... 1 by mouth once daily    Lantus 100 Unit/ml Soln (Insulin glargine) .Marland KitchenMarland KitchenMarland KitchenMarland Kitchen 60 units at bedtime    Aspirin 81 Mg Tabs (Aspirin) .Marland Kitchen... 1 by mouth once daily    Novolog Flexpen 100 Unit/ml Soln (Insulin aspart) .Marland Kitchen... Three times a day (just before each meal) 01-28-09 units, and pen needles three times a day.  Complete Medication List: 1)  Wellbutrin Sr 100 Mg Xr12h-tab (Bupropion hcl) .Marland Kitchen..  1 by mouth once daily 2)  Norvasc 5 Mg Tabs (Amlodipine besylate) .Marland Kitchen.. 1 by mouth once daily 3)  Pravastatin Sodium 40 Mg Tabs (Pravastatin sodium) .Marland Kitchen.. 1 by mouth at bedtime 4)  Coreg 25 Mg Tabs (Carvedilol) .Marland Kitchen.. 1 by mouth two times a day 5)  Zantac 150 Mg Tabs (Ranitidine hcl) .Marland Kitchen.. 1 by mouth two times a day 6)  Folic Acid 1 Mg Tabs (Folic acid) .Marland Kitchen.. 1 by mouth once daily 7)  Zestril 40 Mg Tabs (Lisinopril) .Marland Kitchen.. 1 by mouth once daily 8)  Lantus 100 Unit/ml Soln (Insulin glargine) .... 60 units at bedtime 9)  Aspirin 81 Mg Tabs (Aspirin) .Marland Kitchen.. 1 by mouth once daily 10)  Lasix 20 Mg Tabs (Furosemide) .... 1/2 by mouth once daily 11)  Uloric 40 Mg Tabs (Febuxostat) .Marland Kitchen.. 1 by mouth once daily 12)  Precision Xtra Blood Glucose Strp (Glucose blood) .... Two times a day, and lancets 250.01 13)  Novolog Flexpen 100 Unit/ml Soln (Insulin aspart) .... Three times a day (just before each meal) 01-28-09 units, and pen needles three times a day.  Patient Instructions: 1)   Drink to thirst , up to 40 oz of water/ day.Check BUN, creat. K+ in 4-6 weeks.

## 2010-07-01 NOTE — Progress Notes (Signed)
  Phone Note Call from Patient Call back at [facility] Phone [phone]   Caller: Patient Call For: Dr Everardo All Summary of Call: Pt states [facility] in [place] did not receive refill request for syringes. Initial call taken by: Verdell Face,  June 22, 2010 4:20 PM  Follow-up for Phone Call        pt advised that she was Rx's flexpens, syringes not necessary Follow-up by: Margaret Pyle, CMA,  June 22, 2010 4:34 PM

## 2010-07-01 NOTE — Assessment & Plan Note (Signed)
Summary: NEW ENDO CONSULT/ DM AND THYROID/ COVENTRY/DR HOPPER/NWS   Vital Signs:  Patient profile:   65 year old female Height:      66.25 inches Weight:      252.50 pounds BMI:     40.59 O2 Sat:      97 % on Room air Temp:     97.6 degrees F oral Pulse rate:   57 / minute BP sitting:   122 / 66  (left arm) Cuff size:   large  Vitals Entered By: Margaret Pyle, CMA (June 22, 2010 9:52 AM)  O2 Flow:  Room air CC: New Endo- abnormal labs, elevated A1C/DBD   CC:  New Endo- abnormal labs and elevated A1C/DBD.  History of Present Illness: pt was dx'ed with hyperthyroidism a few years ago.  she was rx'ed with tapazole, until mid-2011.  she now has slight itching diffusely on the skin, and assoc fatigue.   pt says she seldom checks her cbg.    Allergies: 1)  ! Pcn  Past History:  Past Medical History: Last updated: 06/04/2010 Sleep Apnea , CPAP; Glaucoma; Renal  Disease, PMH of , creat 1.8 post op Anemia-iron deficiency Asthma/COPD( Chronic Bronchitis) Depression, PMH of  Diverticulosis, colon GERD Hyperlipidemia Hypertension Hyperthyroidism in context of nodule, Tapazole therapy  since 2007 Skin cancer, hx of, Basal Cell Diabetes mellitus, type II  Family History: Reviewed history from 11/30/2009 and no changes required. Father: CAD, DUD Mother: CVA, DM Siblings: twin :ovarian cancer; MGF : alcoholism identical twin sister took i-131 rx for hyperthyroidism, as did another sister.   Social History: Reviewed history from 11/30/2009 and no changes required. Occupation: Charity fundraiser  Married Never Smoked Alcohol use-yes: RARE Regular exercise-no  Review of Systems       denies headache, hoarseness, double vision, palpitations, sob, polyuria, myalgias, excessive diaphoresis, tremor, anxiety, hypoglycemia, easy bruising, and rhinorrhea.  she has lost a few lbs.  she has intermittent diarrhea, and sloght numbness of the feet.     Physical Exam  General:   morbidly obese.  no distress  Head:  head: no deformity eyes: no periorbital swelling, no proptosis external nose and ears are normal mouth: no lesion seen Neck:  obesity limits exam, but i believe pt has a large goiter Lungs:  Clear to auscultation bilaterally. Normal respiratory effort.  Heart:  Regular rate and rhythm without murmurs or gallops noted. Normal S1,S2.   Abdomen:  abdomen is soft, nontender.  no hepatosplenomegaly.   not distended.  no hernia  Msk:  muscle bulk and strength are grossly normal.  no obvious joint swelling.  gait is normal and steady  Pulses:  dorsalis pedis intact bilat.  no carotid bruit  Extremities:  no deformity.  no ulcer on the feet.  feet are of normal color and temp.  no edema  Neurologic:  cn 2-12 grossly intact.   readily moves all 4's.   sensation is intact to touch on the feet no tremor Skin:  normal texture and temp.  no rash.  not diaphoretic  Cervical Nodes:  No significant adenopathy.  Psych:  Alert and cooperative; normal mood and affect; normal attention span and concentration.     Impression & Recommendations:  Problem # 1:  HYPERTHYROIDISM (ICD-242.90) FT4: 1.88 (06/04/2010)   FT3: 5.1 (06/04/2010)   TSH: 0.010 (06/04/2010)     Problem # 2:  DIABETES MELLITUS, UNCONTROLLED (ICD-250.02) needs increased rx  Problem # 3:  RENAL DISEASE, CHRONIC, MILD (ICD-585.2) prob due to #2  Problem # 4:  DEPRESSION (ICD-311) this complicates the rx of #1 and #2  Medications Added to Medication List This Visit: 1)  Lantus 100 Unit/ml Soln (Insulin glargine) .... 60 units at bedtime 2)  Precision Xtra Blood Glucose Strp (Glucose blood) .... Two times a day, and lancets 250.01 3)  Novolog Flexpen 100 Unit/ml Soln (Insulin aspart) .... Three times a day (just before each meal) 01-28-09 units, and pen needles three times a day.  Other Orders: i-131 Thyroid uptake and scan (Capsule & Scan) Nuc Med Diagnostic (thyroid uptake & sca) Est.  Patient Level V (81191)  Patient Instructions: 1)  good diet and exercise habits significanly improve the control of your diabetes.  please let me know if you wish to be referred to a dietician.  high blood sugar is very risky to your health.  you should see an eye doctor every year. 2)  controlling your blood pressure and cholesterol drastically reduces the damage diabetes does to your body.  this also applies to quitting smoking.  please discuss these with your doctor.  you should take an aspirin every day, unless you have been advised by a doctor not to. 3)  check your blood sugar 2 times a day.  vary the time of day when you check, between before the 3 meals, and at bedtime.  also check if you have symptoms of your blood sugar being too high or too low.  please keep a record of the readings and bring it to your next appointment here.  please call us sooner if you are having low blood sugar episodes.  i have sent a prescription for strips to your pharmacy. 4)  check thyroid "scan" (a special but easy and painless type of thyroid x ray).   5)  Please schedule a follow-up appointment in 2 weeks. 6)  reduce lantus to 60 units at bedtime. 7)  take novolog three times a day (just before each meal) 01-28-09 units, no matter what your blood sugar is.   Prescriptions: NOVOLOG FLEXPEN 100 UNIT/ML SOLN (INSULIN ASPART) three times a day (just before each meal) 01-28-09 units, and pen needles three times a day.  #1 box x 11   Entered and Authorized by:   Minus Breeding MD   Signed by:   Minus Breeding MD on 06/22/2010   Method used:   Electronically to        [facility] [phone]* (retail)       [address] [place], [place]  [address]       Ph: [phone]       Fax: [phone]   RxID:   [id number] PRECISION XTRA BLOOD GLUCOSE  STRP (GLUCOSE BLOOD) two times a day, and lancets 250.01  #100 x 11   Entered and Authorized by:   Minus Breeding MD   Signed by:   Minus Breeding MD on  06/22/2010   Method used:   Electronically to        [facility] [phone]* (retail)       [address] [place], [place]  [address]       Ph: [phone]       Fax: [phone]   RxID:   [id number]    Orders Added: 1)  i-131 Thyroid uptake and scan (Capsule & Scan) Nuc Med Diagnostic [thyroid uptake & sca] 2)  Est. Patient Level V [02542]

## 2010-07-06 ENCOUNTER — Encounter (HOSPITAL_COMMUNITY)
Admission: RE | Admit: 2010-07-06 | Discharge: 2010-07-06 | Disposition: A | Discharge status: Home / Self Care | Payer: Medicare Other | Source: Ambulatory Visit | Attending: Endocrinology | Admitting: Endocrinology

## 2010-07-06 DIAGNOSIS — E0590 Thyrotoxicosis, unspecified without thyrotoxic crisis or storm: Secondary | ICD-10-CM

## 2010-07-06 NOTE — Letter (Signed)
Summary: [employer] Personalized Risk Profile  [employer] Personalized Risk Profile   Imported By: Maryln Gottron 06/29/2010 10:06:15  _____________________________________________________________________  External Attachment:    Type:   Image     Comment:   External Document

## 2010-07-07 ENCOUNTER — Encounter (HOSPITAL_COMMUNITY): Payer: Self-pay

## 2010-07-07 ENCOUNTER — Ambulatory Visit (HOSPITAL_COMMUNITY)
Admission: RE | Admit: 2010-07-07 | Discharge: 2010-07-07 | Disposition: A | Discharge status: Home / Self Care | Payer: Medicare Other | Source: Ambulatory Visit | Attending: Endocrinology | Admitting: Endocrinology

## 2010-07-07 DIAGNOSIS — E0590 Thyrotoxicosis, unspecified without thyrotoxic crisis or storm: Secondary | ICD-10-CM | POA: Insufficient documentation

## 2010-07-07 MED ORDER — SODIUM PERTECHNETATE TC 99M INJECTION
9.6000 | Freq: Once | INTRAVENOUS | Status: AC | PRN
  Administered 2010-07-07: 10 via INTRAVENOUS

## 2010-07-07 MED ORDER — SODIUM IODIDE I 131 CAPSULE: 18.8000 | Freq: Once | INTRAVENOUS | Status: AC | PRN

## 2010-07-08 ENCOUNTER — Encounter: Payer: Self-pay | Admitting: Endocrinology

## 2010-07-08 ENCOUNTER — Ambulatory Visit (INDEPENDENT_AMBULATORY_CARE_PROVIDER_SITE_OTHER): Payer: Medicare Other | Admitting: Endocrinology

## 2010-07-08 DIAGNOSIS — R002 Palpitations: Secondary | ICD-10-CM

## 2010-07-08 DIAGNOSIS — E119 Type 2 diabetes mellitus without complications: Secondary | ICD-10-CM

## 2010-07-08 DIAGNOSIS — E0590 Thyrotoxicosis, unspecified without thyrotoxic crisis or storm: Secondary | ICD-10-CM

## 2010-07-09 ENCOUNTER — Other Ambulatory Visit: Payer: Self-pay | Admitting: Endocrinology

## 2010-07-09 DIAGNOSIS — E0590 Thyrotoxicosis, unspecified without thyrotoxic crisis or storm: Secondary | ICD-10-CM

## 2010-07-19 ENCOUNTER — Ambulatory Visit (HOSPITAL_COMMUNITY)
Admission: RE | Admit: 2010-07-19 | Discharge: 2010-07-19 | Disposition: A | Discharge status: Home / Self Care | Payer: Medicare Other | Source: Ambulatory Visit | Attending: Endocrinology | Admitting: Endocrinology

## 2010-07-19 ENCOUNTER — Encounter (HOSPITAL_COMMUNITY): Payer: Self-pay

## 2010-07-19 DIAGNOSIS — E0590 Thyrotoxicosis, unspecified without thyrotoxic crisis or storm: Secondary | ICD-10-CM

## 2010-07-19 DIAGNOSIS — E0520 Thyrotoxicosis with toxic multinodular goiter without thyrotoxic crisis or storm: Secondary | ICD-10-CM | POA: Insufficient documentation

## 2010-07-19 MED ORDER — SODIUM IODIDE I 131 CAPSULE
29.6000 | Freq: Once | INTRAVENOUS | Status: AC | PRN
  Administered 2010-07-19: 29.6 via ORAL

## 2010-07-22 ENCOUNTER — Telehealth: Payer: Self-pay | Admitting: Internal Medicine

## 2010-07-22 MED ORDER — RANITIDINE HCL 150 MG PO TABS: 150.0000 mg | ORAL_TABLET | Freq: Two times a day (BID) | ORAL | Status: DC

## 2010-07-22 NOTE — Letter (Signed)
Summary: Eye exam/Barbara J Ciampa O D  Eye exam/[facility]bara J Ciampa O [facility]   Imported By: Lester Niles 07/12/2010 07:56:59  _____________________________________________________________________  External Attachment:    Type:   Image     Comment:   External Document

## 2010-07-22 NOTE — Assessment & Plan Note (Signed)
Summary: 2 WK FU  STC   Vital Signs:  Patient profile:   65 year old female Height:      66.25 inches (168.28 cm) Weight:      252 pounds (114.55 kg) BMI:     40.51 O2 Sat:      96 % on Room air Temp:     98.1 degrees F (36.72 degrees C) oral Pulse rate:   63 / minute Pulse rhythm:   regular BP sitting:   124 / 76  (left arm) Cuff size:   large  Vitals Entered By: Brenton Grills CMA Duncan Dull) (July 08, 2010 9:04 AM)  O2 Flow:  Room air CC: 2 week F/U/aj Is Patient Diabetic? Yes   CC:  2 week F/U/aj.  History of Present Illness: pt states few years of intermittent moderate palpitations in the chest.  no assoc sob.  it got better with tapazole, but she has been off of it approx 7 mos ago.   she brings a record of her cbg's which i have reviewed today.  it varies from 120 (lunch) to 210 (hs).    Current Medications (verified): 1)  Wellbutrin Sr 100 Mg Xr12h-Tab (Bupropion Hcl) .Marland Kitchen.. 1 By Mouth Once Daily 2)  Norvasc 5 Mg Tabs (Amlodipine Besylate) .Marland Kitchen.. 1 By Mouth Once Daily 3)  Pravastatin Sodium 40 Mg Tabs (Pravastatin Sodium) .Marland Kitchen.. 1 By Mouth At Bedtime 4)  Coreg 25 Mg Tabs (Carvedilol) .Marland Kitchen.. 1 By Mouth Two Times A Day 5)  Zantac 150 Mg Tabs (Ranitidine Hcl) .Marland Kitchen.. 1 By Mouth Two Times A Day 6)  Folic Acid 1 Mg Tabs (Folic Acid) .Marland Kitchen.. 1 By Mouth Once Daily 7)  Zestril 40 Mg Tabs (Lisinopril) .Marland Kitchen.. 1 By Mouth Once Daily 8)  Lantus 100 Unit/ml Soln (Insulin Glargine) .... 60 Units At Bedtime 9)  Aspirin 81 Mg Tabs (Aspirin) .Marland Kitchen.. 1 By Mouth Once Daily 10)  Lasix 20 Mg Tabs (Furosemide) .... 1/2 By Mouth Once Daily 11)  Uloric 40 Mg Tabs (Febuxostat) .Marland Kitchen.. 1 By Mouth Once Daily 12)  Precision Xtra Blood Glucose  Strp (Glucose Blood) .... Two Times A Day, and Lancets 250.01 13)  Novolog Flexpen 100 Unit/ml Soln (Insulin Aspart) .... Three Times A Day (Just Before Each Meal) 01-28-09 Units, and Pen Needles Three Times A Day.  Allergies (verified): 1)  ! Pcn  Past History:  Past Medical  History: Last updated: 06/04/2010 Sleep Apnea , CPAP; Glaucoma; Renal  Disease, PMH of , creat 1.8 post op Anemia-iron deficiency Asthma/COPD( Chronic Bronchitis) Depression, PMH of  Diverticulosis, colon GERD Hyperlipidemia Hypertension Hyperthyroidism in context of nodule, Tapazole therapy  since 2007 Skin cancer, hx of, Basal Cell Diabetes mellitus, type II  Family History: Reviewed history from 06/22/2010 and no changes required. Father: CAD, DUD Mother: CVA, DM Siblings: twin :ovarian cancer; MGF : alcoholism identical twin sister took i-131 rx for hyperthyroidism, as did another sister.  another sister had a goiter resected.    Review of Systems  The patient denies fever.         denie hypoglycemia  Physical Exam  General:  morbidly obese.  no distress  Neck:  obesity limits exam, but i believe pt has a large goiter Psych:  Alert and cooperative; normal mood and affect; normal attention span and concentration.   Additional Exam:  i reviewed thyroid scan results with pt   Impression & Recommendations:  Problem # 1:  DIABETES MELLITUS, TYPE II (ICD-250.00) she needs some adjustment in her therapy  Problem # 2:  HYPERTHYROIDISM (ICD-242.90) ready for i-131 rx  Problem # 3:  palpitations prob due to #2  Medications Added to Medication List This Visit: 1)  Lantus 100 Unit/ml Soln (Insulin glargine) .... 50 units at bedtime 2)  Novolog Flexpen 100 Unit/ml Soln (Insulin aspart) .... Three times a day (just before each meal) 02-07-19 units, and pen needles three times a day. 3)  Relion Insulin Syringe 31g X 5/16" 0.5 Ml Misc (Insulin syringe-needle u-100) .... Use once daily  Other Orders: Iodine - i-131 Treatment (Capsule) Nuc Med Therapeutic  (i131 Tx) Est. Patient Level IV ([address])  Patient Instructions: 1)  check your blood sugar 2 times a day.  vary the time of day when you check, between before the 3 meals, and at bedtime.  also check if you have symptoms  of your blood sugar being too high or too low.  please keep a record of the readings and bring it to your next appointment here.  please call us sooner if you are having low blood sugar episodes.   2)  Please schedule a follow-up appointment in 6 weeks. 3)  reduce lantus to 50 units at bedtime. 4)  take novolog three times a day (just before each meal) 02-07-19 units, no matter what your blood sugar is.   5)  i have ordered the radioactive iodine treamtment.  you will be called with a day and time for an appointment.   Prescriptions: RELION INSULIN SYRINGE 31G X 5/16" 0.5 ML MISC (INSULIN SYRINGE-NEEDLE U-100) use once daily  #30 x 11   Entered and Authorized by:   Minus Breeding MD   Signed by:   Minus Breeding MD on 07/08/2010   Method used:   Electronically to        [facility] [phone]* (retail)       [address] [place], [place]  [address]       Ph: [phone]       Fax: [phone]   RxID:   [id number]    Orders Added: 1)  Iodine - i-131 Treatment (Capsule) Nuc Med Therapeutic  [i131 Tx] 2)  Est. Patient Level IV [02725]   Immunization History:  Influenza Immunization History:    Influenza:  historical (02/23/2010)   Immunization History:  Influenza Immunization History:    Influenza:  Historical (02/23/2010)

## 2010-07-22 NOTE — Telephone Encounter (Signed)
Per message Patient is new and wants prescription refill on Zantac sent to [facility]

## 2010-07-23 ENCOUNTER — Ambulatory Visit: Payer: Medicare Other | Admitting: Family Medicine

## 2010-08-09 ENCOUNTER — Ambulatory Visit (INDEPENDENT_AMBULATORY_CARE_PROVIDER_SITE_OTHER): Payer: Medicare Other | Admitting: Internal Medicine

## 2010-08-09 ENCOUNTER — Encounter: Payer: Self-pay | Admitting: Internal Medicine

## 2010-08-09 DIAGNOSIS — L509 Urticaria, unspecified: Secondary | ICD-10-CM

## 2010-08-09 DIAGNOSIS — IMO0001 Reserved for inherently not codable concepts without codable children: Secondary | ICD-10-CM

## 2010-08-09 MED ORDER — HYDROXYZINE PAMOATE 25 MG PO CAPS: 25.0000 mg | ORAL_CAPSULE | Freq: Three times a day (TID) | ORAL | Status: AC | PRN

## 2010-08-09 MED ORDER — AMLODIPINE BESYLATE 5 MG PO TABS: 5.0000 mg | ORAL_TABLET | Freq: Two times a day (BID) | ORAL | Status: DC

## 2010-08-09 NOTE — Patient Instructions (Signed)
Follow bland diet as we discussed; it is critical to avoid hyper-allergenic foods such as strawberries, tomatoes, nuts, shellfish, and chocolate. Go to Web M.D. for information on urticaria or hives. Stop all vitamins and supplements as well as Uloric.Marland Kitchen

## 2010-08-09 NOTE — Progress Notes (Signed)
Subjective:    Patient ID: Valerie Stevenson, female    DOB: 31-Jul-1945, 65 y.o.   MRN: [id number]  HPI RASH Onset: 2-3 weeks ago Location : LS as itchy  "red bumps"   Course: worse last week;  She was seen in UC X 2 in [place] ; [place] [facility]  X1 . Rx: Solumedrol , Benadryl, &IV Pepcid Self-treated with: Zyrtec , Kenalog cream @ 1st UC  visit then  Parenteral & oral steroids @ 2nd  UC isit             Improvement with treatment: no; no change off Zestril as  Of 04/13  History Tenderness: no  New medications/antibiotics: RAI pill 3.5 weeks ago  New detergent, new clothing, or other topical exposure: no   Red Flags Feeling ill: no  Fever: no  Mouth lesions: no  Facial/tongue swelling/difficulty breathing:  yes, upper lip & face only  Diabetic  yes    PMH of Hives with PCN & shellfish.   Review of Systems    Diabetes status assessment :Fasting or morning glucose range 200s-300s;  Highest glucose 2 hours after any meal  Not checked ;  Hypoglycemia no  ; Excess thirst yes with steroids;  Excess hunger   No ;  Excess urination : Nocturia ; Lightheadedness with standing no; Chest pain no;  Palpitations  04/13;  Non healing skin  ulcers or sores,especially over the feet no ; Numbness or tingling or burning in feet no ; Significant change in  Weight no;  Vision changes vision blurred this am                                                                       Exercise  No   Nutrition/diet : no,increased amounts of strawberries recently.   Medication compliance  yes Medication adverse  Effects;  Not from DM meds    Eye exam < 12 months  Foot care no   A1c/ urine microalbumin monitor  See Flowsheet      Objective:   Physical Exam she is in no acute distress Over weight in appearance  . Skin is warm and dry. There are some urticarial lesions at the base of the neck on the left.  There is no scleral icterus; pupils were equal round reactive to light.   Otolaryngologic exam is unremarkable. The nares   & otic  canals are patent. The oropharynx reveals no airway compromise.  With hyperventilation there is no evidence of airway stridor.  Chest clear to auscultation with no increased work of breathing.  She has an S4 with a grade 1 systolic murmur. There is either radiation of the murmur or right carotid bruit.  She has no organomegaly or masses.  Pulses are intact. She has no cyanosis clubbing or edema.  There is no lymphadenopathy about the neck or axilla.          Assessment & Plan:  #1 urticaria, possibly drug or food induced  #2 diabetes uncontrolled, exacerbation by oral and parenteral steroids.  Plan: #1 she should avoid all hyper allergenic foods such as strawberries, tomatoes, chocolate, nuts, and shellfish. She'll be referred to the Web M.D. site for  URTICARIA  or  HIVES   Generic Vistaril  prescribed 1 every 6 hours as needed . The Uloric will be stopped.. She'll be asked to stop all supplements and vitamins.

## 2010-08-11 ENCOUNTER — Telehealth: Payer: Self-pay | Admitting: *Deleted

## 2010-08-11 NOTE — Telephone Encounter (Signed)
Spoke w/ pt informed of instructions.

## 2010-08-11 NOTE — Telephone Encounter (Signed)
Stop the carvedilol (Coreg). Use Vistaril every 6 hours as needed. Monitor the blood pressure off the carvedilol; the goal is  average less than 140/90.

## 2010-08-13 ENCOUNTER — Telehealth: Payer: Self-pay | Admitting: *Deleted

## 2010-08-13 ENCOUNTER — Telehealth: Payer: Self-pay | Admitting: Internal Medicine

## 2010-08-13 DIAGNOSIS — L509 Urticaria, unspecified: Secondary | ICD-10-CM

## 2010-08-13 NOTE — Telephone Encounter (Signed)
Spoke w/ Florentina Addison and we can work her in 08/17/10 at 10:00. Spoke w/ renee and made her aware of apt date and time and pt needs to arrive 15 minutes prior to ov. Luster Landsberg states she will inform pt of apt date and time

## 2010-08-13 NOTE — Telephone Encounter (Signed)
Spoke w/ Hop response was to schedule referral for pt to see Allergist. Pt aware of information

## 2010-08-16 ENCOUNTER — Other Ambulatory Visit: Payer: Self-pay | Admitting: *Deleted

## 2010-08-16 NOTE — Telephone Encounter (Signed)
Spironolactone 25 mg one daily dispense 30. Repeat BUN potassium and creatinine after 2 weeks on this medication.(401.9). Did hives get better off Carvedilol ?

## 2010-08-17 ENCOUNTER — Encounter: Payer: Self-pay | Admitting: Internal Medicine

## 2010-08-17 ENCOUNTER — Ambulatory Visit (INDEPENDENT_AMBULATORY_CARE_PROVIDER_SITE_OTHER): Payer: Medicare Other | Admitting: Internal Medicine

## 2010-08-17 ENCOUNTER — Other Ambulatory Visit (INDEPENDENT_AMBULATORY_CARE_PROVIDER_SITE_OTHER): Payer: Medicare Other

## 2010-08-17 ENCOUNTER — Other Ambulatory Visit: Payer: Self-pay | Admitting: Internal Medicine

## 2010-08-17 DIAGNOSIS — L509 Urticaria, unspecified: Secondary | ICD-10-CM

## 2010-08-17 DIAGNOSIS — L501 Idiopathic urticaria: Secondary | ICD-10-CM | POA: Insufficient documentation

## 2010-08-17 LAB — CBC WITH DIFFERENTIAL/PLATELET
Basophils Relative: 0.1 % (ref 0.0–3.0)
Eosinophils Absolute: 0 10*3/uL (ref 0.0–0.7)
Eosinophils Relative: 0 % (ref 0.0–5.0)
Hemoglobin: 12.1 g/dL (ref 12.0–15.0)
Lymphocytes Relative: 5 % — ABNORMAL LOW (ref 12.0–46.0)
MCHC: 33.8 g/dL (ref 30.0–36.0)
Monocytes Relative: 6.3 % (ref 3.0–12.0)
Neutrophils Relative %: 88.6 % — ABNORMAL HIGH (ref 43.0–77.0)
RBC: 3.93 Mil/uL (ref 3.87–5.11)
WBC: 16.6 10*3/uL — ABNORMAL HIGH (ref 4.5–10.5)

## 2010-08-17 MED ORDER — RANITIDINE HCL 150 MG PO TABS: 300.0000 mg | ORAL_TABLET | Freq: Two times a day (BID) | ORAL | Status: DC

## 2010-08-17 MED ORDER — SPIRONOLACTONE 25 MG PO TABS: 25.0000 mg | ORAL_TABLET | Freq: Every day | ORAL | Status: DC

## 2010-08-17 MED ORDER — FEXOFENADINE HCL 180 MG PO TABS: 180.0000 mg | ORAL_TABLET | Freq: Every day | ORAL | Status: DC

## 2010-08-17 NOTE — Patient Instructions (Signed)
Add fexofenadine/ Allegra 180   Once every day  Continue Zantac, increased to 300 mg twice daily  Ok to eat what you like, but stay away from foods that seem to cause trouble- especially the seafood  Avoid laundry products with bacterial enzymes. For now replace dryer fabric softener sheets with Downy    Order- lab- CBC w/ diff, Allergy profile/ RAST

## 2010-08-17 NOTE — Assessment & Plan Note (Addendum)
New onset urticaria. Changes in BP meds followed onset. We looked at timing oif thyroid changes/ RAI, use of bacterial enzymes in fabric softener sheets, new home with many boxes brought in from storage unit. Diabetic. This began before her recent visit to daughter's home.  We will send allergy profile for IgE survey and try symptom supression with dual antihistamines.  Usually the etiology will be fairly apparent, or will remain obscure. She doesn't recognize new exposures other than as described or any recent hormone changes except the thyroid, which is a likely prospect. No recent infection.

## 2010-08-17 NOTE — Telephone Encounter (Signed)
Spoke w/ pt says hives have started to resolve but pt seen in er last night was having trouble swallowing and tongue swollen. Currently was in allergist office will call to schedule appt in 2 weeks.

## 2010-08-17 NOTE — Progress Notes (Signed)
  Subjective:    Patient ID: Valerie Stevenson, female    DOB: 1946/02/09, 65 y.o.   MRN: [id number]  HPI 35 yoF see on referral from Dr Alwyn Ren for allergy evaluation with hx asthma, COPD and food allergy to shellfish. Main concern is new onset of hives 3 weeks prior to this visit. Mild itching began a day or two before she travelled to [place] to visit daughter, with generalized pruritic hives while there. Not an unusual trip for her. Meds and exposures stable before. Had been on lisinopril for years. Went to Urgent Care twice, then to an ER. Taken off lisinopril, then Coreg. Some difficulty swallowing without respiratory distress, palate itching, cramps, nodes, wheeze or joint pain. Yesterday went to ER in [place] and given decadron. Off antihistamines now x 48 hours. Still itching today though no hives now.  Has been on Zantac for acid indigestion. RAI 5 weeks ago for hyper thyroid nodule- Dr Everardo All. Hx that shellfish cause hives- strictly avoided. Lives in brand new house since December, 2011.    Review of Systems See HPI Constitutional:   No weight loss, night sweats,  Fevers, chills, fatigue, lassitude. HEENT:   No headaches,  Difficulty swallowing,  Tooth/dental problems,  Sore throat,                No sneezing, itching, ear ache, nasal congestion, post nasal drip,   CV:  No chest pain,  Orthopnea, PND, swelling in lower extremities, anasarca, dizziness, palpitations  GI  No -, abdominal pain, nausea, vomiting, diarrhea, change in bowel habits, loss of appetite  Resp: No shortness of breath with exertion or at rest.  No excess mucus, no productive cough, Mild-non-productive cough,  No coughing up of blood.  No change in color of mucus.  No wheezing.  Skin: no rash or lesions.  GU: no dysuria, change in color of urine, no urgency or frequency.  No flank pain.  MS:  No joint pain or swelling.  No decreased range of motion.  No back pain.  Psych:  No change in mood or affect. No  depression or anxiety.  No memory loss.      Objective:   Physical Exam General- Alert, Oriented, Affect-appropriate, Distress- none acute   overweight  Skin- rash-none, lesions- none,        excoriation-on legs Lymphadenopathy- none  Head- atraumatic  Eyes- Gross vision intact, PERRLA, conjunctivae clear secretions  Ears- Normal- Hearing, canals, Tm L ,   R ,  Nose- Clear, No-Septal dev, mucus, polyps, erosion, perforation   Throat- Mallampati II , mucosa clear , drainage- none, tonsils- atrophic  Neck- flexible , trachea midline, no stridor , thyroid nl, carotid no bruit  Chest - symmetrical excursion , unlabored     Heart/CV- RRR , no murmur , no gallop  , no rub, nl s1 s2                     - JVD- none , edema- none, stasis changes- none, varices- none     Lung- clear to P&A, wheeze- none, cough- none , dullness-none, rub- none     Chest wall-  Abd- tender-no, distended-no, bowel sounds-present, HSM- no  Br/ Gen/ Rectal- Not done, not indicated  Extrem- cyanosis- none, clubbing, none, atrophy- none, strength- nl  Neuro- grossly intact to observation         Assessment & Plan:

## 2010-08-18 LAB — ALLERGY FULL PROFILE
Alternaria Alternata: <0.35 kU/L (ref <0.35)
Bermuda Grass: <0.35 kU/L (ref <0.35)
Candida Albicans: <0.35 kU/L (ref <0.35)
Curvularia lunata: <0.35 kU/L (ref <0.35)
Elm IgE: <0.35 kU/L (ref <0.35)
Fescue: <0.35 kU/L (ref <0.35)
G009 Red Top: <0.35 kU/L (ref <0.35)
Lamb's Quarters: <0.35 kU/L (ref <0.35)
Oak: <0.35 kU/L (ref <0.35)
Timothy Grass: <0.35 kU/L (ref <0.35)

## 2010-08-19 ENCOUNTER — Other Ambulatory Visit (INDEPENDENT_AMBULATORY_CARE_PROVIDER_SITE_OTHER): Payer: Medicare Other

## 2010-08-19 ENCOUNTER — Ambulatory Visit (INDEPENDENT_AMBULATORY_CARE_PROVIDER_SITE_OTHER): Payer: Medicare Other | Admitting: Endocrinology

## 2010-08-19 ENCOUNTER — Other Ambulatory Visit (INDEPENDENT_AMBULATORY_CARE_PROVIDER_SITE_OTHER): Payer: Medicare Other | Admitting: Endocrinology

## 2010-08-19 ENCOUNTER — Encounter: Payer: Self-pay | Admitting: Endocrinology

## 2010-08-19 ENCOUNTER — Telehealth: Payer: Self-pay | Admitting: Internal Medicine

## 2010-08-19 DIAGNOSIS — E785 Hyperlipidemia, unspecified: Secondary | ICD-10-CM

## 2010-08-19 DIAGNOSIS — E0590 Thyrotoxicosis, unspecified without thyrotoxic crisis or storm: Secondary | ICD-10-CM

## 2010-08-19 DIAGNOSIS — E119 Type 2 diabetes mellitus without complications: Secondary | ICD-10-CM

## 2010-08-19 LAB — LIPID PANEL
Cholesterol: 150 mg/dL (ref 0–200)
Triglycerides: 210 mg/dL — ABNORMAL HIGH (ref 0.0–149.0)
VLDL: 42 mg/dL — ABNORMAL HIGH (ref 0.0–40.0)

## 2010-08-19 LAB — T4, FREE: Free T4: 2.7 ng/dL — ABNORMAL HIGH (ref 0.60–1.60)

## 2010-08-19 MED ORDER — INSULIN GLARGINE 100 UNIT/ML ~~LOC~~ SOLN: 50.0000 [IU] | Freq: Every day | SUBCUTANEOUS | Status: DC

## 2010-08-19 NOTE — Progress Notes (Signed)
  Subjective:    Patient ID: Valerie Stevenson, female    DOB: [birth year], 65 y.o.   MRN: [id number]  HPI The state of at least three ongoing medical problems is addressed today: Dm: Pt says her cbg's have been elevated due to steroids, most recently 2 days ago.  she brings a record of her cbg's which i have reviewed today.  Other than the few days after the steroids, it stays in the low-100's. Dyslipidemia: She takes pravachol qd for cholesterol, and tolerates well. She is 6 weeks s/p i-131 rx for hyperthyroidism.  Denies weight change. Past Medical History  Diagnosis Date  . Diabetes mellitus   . Toxic multinodular goiter   . OSA (obstructive sleep apnea)     CPAP  . Glaucoma   . Renal disease   . Anemia     iron deficinecy  . Asthma   . COPD (chronic obstructive pulmonary disease)   . Depression   . GERD (gastroesophageal reflux disease)   . Hyperlipidemia   . Hypertension   . Hyperthyroidism     in context of nodule; Tapazole therapy since 2007  . Skin cancer   . Diverticulosis of colon   . Urticaria, idiopathic    Past Surgical History  Procedure Date  . Total abdominal hysterectomy w/ bilateral salpingoophorectomy   . Replacement total knee 2005    left     reports that she has never smoked. She does not have any smokeless tobacco history on file. She reports that she does not drink alcohol or use illicit drugs. family history includes Alcohol abuse in her maternal grandfather; Coronary artery disease in her father; Diabetes in her mother; Goiter in her sister; Hyperthyroidism in her sister; Ovarian cancer in her sister; and Stroke in her mother. Allergies  Allergen Reactions  . Latex   . Morphine And Related Itching  . Penicillins     REACTION: Hives  . Shellfish-Derived Products    Review of Systems denies hypoglycemia and palpitations.    Objective:   Physical Exam GENERAL: no distress.  Obese. Neck:  i think i can feel the top of a large goiter.    Lab  Results  Component Value Date   TSH 0.03* 08/19/2010   Lab Results  Component Value Date   HGBA1C 8.0* 06/04/2010   Lab Results  Component Value Date   CHOL 150 08/19/2010   Lab Results  Component Value Date   HDL 38.70* 08/19/2010   No results found for this basename: [facility]   Lab Results  Component Value Date   TRIG 210.0* 08/19/2010   Lab Results  Component Value Date   CHOLHDL 4 08/19/2010      Assessment & Plan:  Hyperthyroidism, not better yet. Dm.  This is good control, allowing for the time it was elev after the steroids. Dyslipidemia, well-controlled.

## 2010-08-19 NOTE — Patient Instructions (Addendum)
blood tests are being ordered for you today.  please call [phone] to hear your test results. continue lantus to 50 units at bedtime, and novolog three times a day (just before each meal) 02-07-19 units, no matter what your blood sugar is.   Please make a follow-up appointment in 1 month. (update: i left message on phone-tree:  i offered tapazole rx while i-131 is working).

## 2010-08-19 NOTE — Telephone Encounter (Signed)
Valerie Stevenson

## 2010-08-20 ENCOUNTER — Telehealth: Payer: Self-pay | Admitting: Pulmonary Disease

## 2010-08-20 ENCOUNTER — Telehealth: Payer: Self-pay

## 2010-08-20 ENCOUNTER — Telehealth: Payer: Self-pay | Admitting: Internal Medicine

## 2010-08-20 MED ORDER — METHIMAZOLE 10 MG PO TABS: ORAL_TABLET | ORAL | Status: DC

## 2010-08-20 NOTE — Telephone Encounter (Signed)
Error.Valerie Stevenson ° ° °

## 2010-08-20 NOTE — Telephone Encounter (Signed)
Pt called stating she received [employer] message and does want to start Thyroid treatment as advised per message.

## 2010-08-20 NOTE — Telephone Encounter (Signed)
Pt advised.

## 2010-08-20 NOTE — Telephone Encounter (Signed)
i sent rx if ever you have fever while taking this medication, stop it and call us, because of the risk of a rare side-effect

## 2010-08-20 NOTE — Telephone Encounter (Signed)
CBC- elevated WBC due to recent steroids. OK.  Spoke w/ Valerie Stevenson and advised her of this. Valerie Stevenson verbalized understanding. Valerie Stevenson is requesting her lab results from her allergy profile. Please advise Dr. Maple Hudson. Thanks

## 2010-08-23 NOTE — Telephone Encounter (Signed)
I am not finding results of Allergy profile??? She is asking for.

## 2010-08-24 ENCOUNTER — Telehealth: Payer: Self-pay | Admitting: Internal Medicine

## 2010-08-24 ENCOUNTER — Telehealth: Payer: Self-pay | Admitting: *Deleted

## 2010-08-24 NOTE — Telephone Encounter (Signed)
Allergy profile- Total allergy class antibody (IgE) is elevated, but it isn't clear what it is targeting. The specific antibodies aren't increased. We will talk about skin teszting.

## 2010-08-24 NOTE — Telephone Encounter (Signed)
Spoke w/ pt aware of recommendations. 

## 2010-08-24 NOTE — Telephone Encounter (Signed)
Spoke w/ pt says that she isn't seeing any results in BP since starting spironolactone has been around 170/60's. Pt is currently out of town and won't be back until the Monday. Appt scheduled to have pt f/u up w/ her bp cuff. But would like to know if Hop has any other suggestions.

## 2010-08-24 NOTE — Telephone Encounter (Signed)
Please increase the Spironolactone to 25 mg bid & monitor BP

## 2010-08-24 NOTE — Telephone Encounter (Signed)
Please advise Dr. Maple Hudson. Pt had allergy panel done on 08/17/10 and is requesting these results. Thanks  Carver Fila, CMA

## 2010-08-25 MED ORDER — DOXEPIN HCL 10 MG PO CAPS: 10.0000 mg | ORAL_CAPSULE | Freq: Every day | ORAL | Status: DC

## 2010-08-25 NOTE — Telephone Encounter (Signed)
Pt aware of prescriptions and will call us back with pharmacy name and address because she is out of town right now. RX on hold until we get that information.

## 2010-08-25 NOTE — Telephone Encounter (Signed)
Per CDY-add doxepin 10mg  #15 1 po qhs and add Zantac 300mg   1 po bid and okay to work in.

## 2010-08-25 NOTE — Telephone Encounter (Signed)
Patient called back stated her face is swollen and she has hives on her back and arms they are itching she can be reached at [phone]. Her follow up isn't until 5/31.Vedia Coffer

## 2010-08-25 NOTE — Telephone Encounter (Signed)
Per 08-24-2010 note; stated to add Doxepin 10mg  #15 1 po qhs and Zantac 300mg  1 po bid and keep 09-02-2010.

## 2010-08-25 NOTE — Telephone Encounter (Signed)
Pt would like prescription sent to [facility], [address], [place], [place], [phone].  RX sent.

## 2010-08-25 NOTE — Telephone Encounter (Signed)
Pt is aware IgE levels are elevated and is sch for OV with CDY on 5/10 @ 10:30 am to discuss results and plan for next step. Pt mentioned that she has been having trouble sleeping and wants to know if CDY would prescribe something for her until all of this is straightened out. Pls advise.

## 2010-08-25 NOTE — Telephone Encounter (Signed)
Please see phone note from 08-24-10 for reponse. Thanks

## 2010-08-25 NOTE — Telephone Encounter (Signed)
Spoke w/ pt and she is aware of her allergy profile results. Pt c/o hives on her feet, legs, back, and arms. Also her left side of her face is swollen, pt states she has whelps on her body, hand itching, has knots on her head, and she is not sleeping well. Pt states this was getting better after her last OV but now is back and getting worse. Pt states she has been taking benadryl at night and allegra during the day. Pt next apt is 5/31 and states she can't wait until then to be seen. Please advise Dr. Maple Hudson. Thanks  Allergies  Allergen Reactions  . Latex   . Morphine And Related Itching  . Penicillins     REACTION: Hives  . Shellfish-Derived Products      [place], [place]

## 2010-08-25 NOTE — Telephone Encounter (Signed)
Worked pt in on Monday 08-30-10 at 1115am with CDY.

## 2010-08-28 ENCOUNTER — Encounter: Payer: Self-pay | Admitting: Internal Medicine

## 2010-08-30 ENCOUNTER — Encounter: Payer: Self-pay | Admitting: Internal Medicine

## 2010-08-30 ENCOUNTER — Ambulatory Visit (INDEPENDENT_AMBULATORY_CARE_PROVIDER_SITE_OTHER): Payer: Medicare Other | Admitting: Internal Medicine

## 2010-08-30 ENCOUNTER — Other Ambulatory Visit: Payer: Self-pay | Admitting: *Deleted

## 2010-08-30 ENCOUNTER — Ambulatory Visit: Payer: Medicare Other | Admitting: Internal Medicine

## 2010-08-30 DIAGNOSIS — I10 Essential (primary) hypertension: Secondary | ICD-10-CM

## 2010-08-30 DIAGNOSIS — L509 Urticaria, unspecified: Secondary | ICD-10-CM

## 2010-08-30 LAB — CREATININE, SERUM: Creatinine, Ser: 1.5 mg/dL — ABNORMAL HIGH (ref 0.4–1.2)

## 2010-08-30 MED ORDER — SPIRONOLACTONE 50 MG PO TABS: 50.0000 mg | ORAL_TABLET | Freq: Every day | ORAL | Status: DC

## 2010-08-30 MED ORDER — PRAVASTATIN SODIUM 40 MG PO TABS: 40.0000 mg | ORAL_TABLET | Freq: Every day | ORAL | Status: DC

## 2010-08-30 MED ORDER — LORAZEPAM 0.5 MG PO TABS: ORAL_TABLET | ORAL | Status: DC

## 2010-08-30 MED ORDER — CLONIDINE HCL 0.1 MG PO TABS: 0.1000 mg | ORAL_TABLET | Freq: Two times a day (BID) | ORAL | Status: DC

## 2010-08-30 MED ORDER — AMLODIPINE BESYLATE 5 MG PO TABS: 5.0000 mg | ORAL_TABLET | Freq: Two times a day (BID) | ORAL | Status: DC

## 2010-08-30 NOTE — Progress Notes (Signed)
  Subjective:    Patient ID: Valerie Stevenson, female    DOB: Sep 06, 1945, 65 y.o.   MRN: [id number]  HPI CHRONIC HYPERTENSION  Disease Monitoring  Blood pressure range: 154/70-177/73  Chest pain: no   Dyspnea: yes, slightly   Claudication: no, but cramps @ night    Medication Side Effects:  Lightheadedness: no   Urinary frequency: yes, occasionally   Edema: yes, @ ankles by end of day  Preventitive Healthcare:  Exercise: no, no energy   Diet Pattern: no specific  Salt Restriction: no      Review of Systems     Objective:   Physical Exam Heart:  Normal rate and regular rhythm. S1 and S2 normal without gallop,  click, rub or other extra sounds Grade 1/6 systolic murmur.Lungs:Chest clear to auscultation; no wheezes, rhonchi,rales ,or rubs present.No increased work of breathing.No AAA.  All pulses intact without  bruits .No ischemic skin changes, but scattered urticarial lesions of arms.          Assessment & Plan:  #1 hypertension, suboptimal control. Problematic is her ongoing urticaria and past history of asthma. The ARB and angiotensin-converting enzyme class of drugs are contraindicated with urticaria. The beta blockers are relatively contraindicated with a history of asthma  The safest alternative would appeared to be an clonidine and gradually increase the dose as needed. The only adverse effect to be considered would be dry  mouth which  should resolve within a short period time if it were  present at all.  Even with doxepin at bedtime she is sleeping poorly he admits to being extremely anxious. Low-dose lorazepam will be added.

## 2010-08-30 NOTE — Patient Instructions (Signed)
BP goal = average  Of < 140/90, ideally < 135/85.Clonidine can be titrated if needed.

## 2010-09-02 ENCOUNTER — Ambulatory Visit (INDEPENDENT_AMBULATORY_CARE_PROVIDER_SITE_OTHER): Payer: Medicare Other | Admitting: Internal Medicine

## 2010-09-02 ENCOUNTER — Encounter: Payer: Self-pay | Admitting: Internal Medicine

## 2010-09-02 DIAGNOSIS — L509 Urticaria, unspecified: Secondary | ICD-10-CM

## 2010-09-02 MED ORDER — MONTELUKAST SODIUM 10 MG PO TABS: 10.0000 mg | ORAL_TABLET | Freq: Every day | ORAL | Status: DC

## 2010-09-02 NOTE — Progress Notes (Signed)
  Subjective:    Patient ID: Valerie Stevenson, female    DOB: 26-Jul-1945, 65 y.o.   MRN: [id number]  HPI 09/02/10- 50 yoF w/ hx urticaria complicated by asthma/ COPD, food allergy to shellfish, RAI for thyroid Last here 08/17/10 for initial visit for Dr Alwyn Ren, with onset of hives 3 weeks previously. Discussed chronic lisinopril. Better with hives but still getting some especially as she gets up in the morning- shower makes them more visible. She has been taking Allegra 180, with Zantac 300mg  bid. She remains itchy. Had tried doxepin 10- no help and didn't like it. Dr Alwyn Ren gave Ativan.  Allergy profile-  IgE 296, but no specific elevations, WBC 16,600; No eos. No overt infection.   Review of Systems Constitutional:   No weight loss, night sweats,  Fevers, chills, fatigue, lassitude. HEENT:   No headaches,  Difficulty swallowing,  Tooth/dental problems,  Sore throat,                No sneezing,, ear ache, nasal congestion, post nasal drip,   CV:  No chest pain,  Orthopnea, PND, swelling in lower extremities, anasarca, dizziness, palpitations  GI  No heartburn, indigestion, abdominal pain, nausea, vomiting, diarrhea, change in bowel habits, loss of appetite  Resp: No shortness of breath with exertion or at rest.  No excess mucus, no productive cough,  No non-productive cough,  No coughing up of blood.  No change in color of mucus.  No wheezing.   Skin: GU: no dysuria, change in color of urine, no urgency or frequency.  No flank pain.  MS:  No joint pain or swelling.  No decreased range of motion.  No back pain.  Psych:  No change in mood or affect. No depression or anxiety.  No memory loss.      Objective:   Physical Exam General- Alert, Oriented, Affect-appropriate, Distress- none acute, except that she is scratching at herself.  Skin- rash-none, lesions- none, excoriation- none.  Dry, but no hives.  Lymphadenopathy- none  Head- atraumatic  Eyes- Gross vision intact, PERRLA,  conjunctivae clear secretions  Ears- Hearing, canals, Tm L ,   R , - normal  Nose- Clear, No-Septal dev, mucus, polyps, erosion, perforation   Throat- Mallampati II , mucosa clear , drainage- none, tonsils- atrophic  Neck- flexible , trachea midline, no stridor , thyroid nl, carotid no bruit  Chest - symmetrical excursion , unlabored     Heart/CV- RRR , no murmur , no gallop  , no rub, nl s1 s2                     - JVD- none , edema- none, stasis changes- none, varices- none     Lung- clear to P&A, wheeze- none, cough- none , dullness-none, rub- none     Chest wall- Abd- tender-no, distended-no, bowel sounds-present, HSM- no  Br/ Gen/ Rectal- Not done, not indicated  Extrem- cyanosis- none, clubbing, none, atrophy- none, strength- nl  Neuro- grossly intact to observation         Assessment & Plan:

## 2010-09-02 NOTE — Patient Instructions (Addendum)
Stop Allegra and Doxepin  Continue Zantac   Add Singulair  Schedule return for allergy skin testing-- You will need to be off all antihistamines, including Zantac, any otc cold/ allergy meds, cough syrups or sleep meds.     Singulair will be ok to continue.

## 2010-09-03 ENCOUNTER — Encounter: Payer: Self-pay | Admitting: Internal Medicine

## 2010-09-04 NOTE — Assessment & Plan Note (Signed)
This is non-specific, and may remain unexplained. It could easily be related to her thyroid treatment by timing, or her diabetes. She was tried off lisinopril. She is getting frustrated and may need referral, perhaps to a [employer]. We will adjust meds for another trial. She had anticipated more allergy evaluation, but I told her yield would be low.

## 2010-09-23 ENCOUNTER — Other Ambulatory Visit (INDEPENDENT_AMBULATORY_CARE_PROVIDER_SITE_OTHER): Payer: Medicare Other

## 2010-09-23 ENCOUNTER — Encounter: Payer: Self-pay | Admitting: Endocrinology

## 2010-09-23 ENCOUNTER — Ambulatory Visit (INDEPENDENT_AMBULATORY_CARE_PROVIDER_SITE_OTHER): Payer: Medicare Other | Admitting: Endocrinology

## 2010-09-23 ENCOUNTER — Ambulatory Visit: Payer: Medicare Other | Admitting: Internal Medicine

## 2010-09-23 DIAGNOSIS — E0590 Thyrotoxicosis, unspecified without thyrotoxic crisis or storm: Secondary | ICD-10-CM

## 2010-09-23 DIAGNOSIS — E119 Type 2 diabetes mellitus without complications: Secondary | ICD-10-CM

## 2010-09-23 LAB — T4, FREE: Free T4: 2.03 ng/dL — ABNORMAL HIGH (ref 0.60–1.60)

## 2010-09-23 LAB — TSH: TSH: 0.03 u[IU]/mL — ABNORMAL LOW (ref 0.35–5.50)

## 2010-09-23 NOTE — Progress Notes (Signed)
Subjective:    Patient ID: Valerie Stevenson, female    DOB: 06/23/1945, 65 y.o.   MRN: [id number]  HPI The state of at least three ongoing medical problems is addressed today: Pt is 2 mos s/p i-131 rx for hyperthyroidism:  She feels less tired on tapazole.  Multinodular goiter.  she does not notice this. Dm: she brings a record of her cbg's which i have reviewed today. Past Medical History  Diagnosis Date  . Diabetes mellitus   . Toxic multinodular goiter   . OSA (obstructive sleep apnea)     CPAP  . Glaucoma   . Renal disease   . Anemia     iron deficinecy  . Asthma   . COPD (chronic obstructive pulmonary disease)   . Depression   . GERD (gastroesophageal reflux disease)   . Hyperlipidemia   . Hypertension   . Hyperthyroidism     in context of nodule; Tapazole therapy since 2007  . Skin cancer   . Diverticulosis of colon   . Urticaria, idiopathic     Past Surgical History  Procedure Date  . Total abdominal hysterectomy w/ bilateral salpingoophorectomy   . Replacement total knee 2005    left     History   Social History  . Marital Status: Married    Spouse Name: N/A    Number of Children: N/A  . Years of Education: N/A   Occupational History  . RN-Retired    Social History Main Topics  . Smoking status: Never Smoker   . Smokeless tobacco: Not on file  . Alcohol Use: No  . Drug Use: No  . Sexually Active: Not on file   Other Topics Concern  . Not on file   Social History Narrative  . No narrative on file    Current Outpatient Prescriptions on File Prior to Visit  Medication Sig Dispense Refill  . amLODipine (NORVASC) 5 MG tablet Take 1 tablet (5 mg total) by mouth 2 (two) times daily.  60 tablet  5  . B-D UF III MINI PEN NEEDLES 31G X 5 MM MISC As directed      . buPROPion (WELLBUTRIN XL) 150 MG 24 hr tablet Take 150 mg by mouth daily.        . cloNIDine (CATAPRES) 0.1 MG tablet Take 1 tablet (0.1 mg total) by mouth 2 (two) times daily.  60 tablet  5    . doxepin (SINEQUAN) 10 MG capsule Take 1 capsule (10 mg total) by mouth at bedtime.  15 capsule  0  . furosemide (LASIX) 20 MG tablet Take 20 mg by mouth as directed. 1/2 by mouth daily       . Glucose Blood (PRECISION XTRA TEST VI)        . insulin aspart (NOVOLOG FLEXPEN) 100 UNIT/ML injection Inject into the skin 3 (three) times daily before meals. 02-07-19 units       . insulin glargine (LANTUS) 100 UNIT/ML injection Inject 50 Units into the skin at bedtime.  20 mL  11  . Insulin Syringe-Needle U-100 (RELION INSULIN SYR 0.5ML/31G) 31G X 5/16" 0.5 ML MISC by Does not apply route.        Marland Kitchen LORazepam (ATIVAN) 0.5 MG tablet Do not take within 6 hrs of Doxepin  30 tablet  1  . methimazole (TAPAZOLE) 10 MG tablet 3 tabs 2x a day.  180 tablet  11  . montelukast (SINGULAIR) 10 MG tablet Take 1 tablet (10 mg total) by mouth daily.  30 tablet  2  . pravastatin (PRAVACHOL) 40 MG tablet Take 1 tablet (40 mg total) by mouth daily.  90 tablet  1  . ranitidine (ZANTAC) 150 MG tablet Take 2 tablets (300 mg total) by mouth 2 (two) times daily.  60 tablet  4  . RELION ULTRA THIN PLUS LANCETS MISC As directed       . spironolactone (ALDACTONE) 50 MG tablet Take 1 tablet (50 mg total) by mouth daily.  30 tablet  6  . triamcinolone (KENALOG) 0.1 % lotion As directed       . DISCONTD: fexofenadine (ALLEGRA) 180 MG tablet Take 1 tablet (180 mg total) by mouth daily.  30 tablet  2    Allergies  Allergen Reactions  . Latex   . Morphine And Related Itching  . Penicillins     REACTION: Hives  . Shellfish-Derived Products     Family History  Problem Relation Age of Onset  . Coronary artery disease Father   . Stroke Mother   . Diabetes Mother   . Ovarian cancer Sister     twin sibling  . Alcohol abuse Maternal Grandfather   . Hyperthyroidism Sister     twin  . Goiter Sister     resected    BP 118/82  Pulse 65  Temp(Src) 98.4 F (36.9 C) (Oral)  Ht 5\' 6"  (1.676 m)  Wt 238 lb 1.9 oz (108.011 kg)   BMI 38.43 kg/m2  SpO2 96%     Review of Systems Denies hypoglycemia and fever    Objective:   Physical Exam GENERAL: no distress Thyroid: large goiter,right >> left.       Assessment & Plan:  Multinodular goiter, no improvement yet Hyperthyroidism due to the goiter.  She is on tapazole while the i-131 is working Dm, with apparently improved control

## 2010-09-23 NOTE — Patient Instructions (Addendum)
blood tests are being ordered for you today.  please call [phone] to hear your test results.  You will be prompted to enter the 9-digit "MRN" number that appears at the top left of this page, followed by #.  Then you will hear the message. pending the test results, please continue the same medications for now Please make a follow-up appointment in 1 month if ever you have fever while taking methimazole, stop it and call us, because of the risk of a rare side-effect

## 2010-09-24 ENCOUNTER — Institutional Professional Consult (permissible substitution): Payer: Medicare Other | Admitting: Internal Medicine

## 2010-10-06 ENCOUNTER — Telehealth: Payer: Self-pay | Admitting: Internal Medicine

## 2010-10-06 NOTE — Telephone Encounter (Signed)
Please see what is available

## 2010-10-06 NOTE — Telephone Encounter (Signed)
Encounter closed in error, and forwarded to [facility].

## 2010-10-06 NOTE — Telephone Encounter (Signed)
Please advise if any way alt can be moved up, thanks!

## 2010-10-07 NOTE — Telephone Encounter (Signed)
No open appts-July is the first open time for skin testing.

## 2010-10-18 ENCOUNTER — Ambulatory Visit (HOSPITAL_BASED_OUTPATIENT_CLINIC_OR_DEPARTMENT_OTHER)
Admission: RE | Admit: 2010-10-18 | Discharge: 2010-10-18 | Disposition: A | Discharge status: Home / Self Care | Payer: Medicare Other | Source: Ambulatory Visit | Attending: Internal Medicine | Admitting: Internal Medicine

## 2010-10-18 ENCOUNTER — Encounter: Payer: Self-pay | Admitting: Internal Medicine

## 2010-10-18 ENCOUNTER — Ambulatory Visit (INDEPENDENT_AMBULATORY_CARE_PROVIDER_SITE_OTHER): Payer: Medicare Other | Admitting: Internal Medicine

## 2010-10-18 VITALS — BP 122/70 | HR 68 | Wt 236.0 lb

## 2010-10-18 DIAGNOSIS — M546 Pain in thoracic spine: Secondary | ICD-10-CM | POA: Insufficient documentation

## 2010-10-18 DIAGNOSIS — M5380 Other specified dorsopathies, site unspecified: Secondary | ICD-10-CM | POA: Insufficient documentation

## 2010-10-18 MED ORDER — CYCLOBENZAPRINE HCL 5 MG PO TABS: 5.0000 mg | ORAL_TABLET | Freq: Three times a day (TID) | ORAL | Status: DC | PRN

## 2010-10-18 NOTE — Progress Notes (Signed)
  Subjective:    Patient ID: Valerie Stevenson, female    DOB: 19-Sep-1945, 65 y.o.   MRN: [id number]  HPI BACK PAIN Location: mid thoracic spine   Onset: 6/19 after walking on beach & climbing stairs, progression 6/22 as severe spasms   Severity: up 10 @ night or early am Pain is described as: sharp  Worse with: in bed    Better with: NSAIDS , Tylenol & heat with minimal relief  Pain radiates to: anterior chest   Impaired range of motion: yes due to spasm  History of repetitive motion:  no  History of overuse or hyperextension:  no  History of trauma:  no   Past history of similar problem:  yes, but in LS  area  Symptoms Numbness/tingling:  no  Weakness:  no Red Flags Fever:  no  Bowel/bladder dysfunction:  No PMH: no Osteoporosis on BMD x2 ; no prolonged steroids but IV  By EMS for anaphylaxis due to shellfish 6/12 @ beach      Review of Systems     Objective:   Physical Exam Gen.:Uncomfortable but in NAD. Alert, appropriate and cooperative throughout exam. Neck: No deformities, masses, or tenderness noted. Range of motion  normal Lungs: Normal respiratory effort; chest expands symmetrically. Lungs are clear to auscultation without rales, wheezes, or increased work of breathing. Heart: Normal rate and rhythm. Normal S1 and S2. No gallop, click, or rub. Grade 1/6 systolic murmur. Abdomen: Bowel sounds normal; abdomen soft and nontender. No masses, organomegaly or hernias noted.No AAA. Musculoskeletal/extremities: No deformity or scoliosis noted of  the thoracic or lumbar spine except some superior lordosis. No clubbing, cyanosis, edema, or deformity noted. Range of motion  normal .Tone & strength  normal.Joints normal. Nail health  Good.She lay down & sat up w/o help but described pain mid spine.Crepitus L > R knee. Vascular: dorsalis pedis and dorsalis posterior tibial pulses are full and equal. No bruits present. Neurologic: Alert and oriented x3. Deep tendon reflexes symmetrical  and normal.  Gait (heel/toe) deliberate but essentially normal.Negative SLR.        Skin: Intact without suspicious lesions or rashes. Lymph: No cervical,or axillary lymphadenopathy present. Psych: Mood and affect are normal. Normally interactive                                                                                         Assessment & Plan:  #1 thoracic spine pain Plan: see Orders

## 2010-10-18 NOTE — Patient Instructions (Signed)
[facility] @ [address] facility.

## 2010-11-03 ENCOUNTER — Encounter: Payer: Self-pay | Admitting: Internal Medicine

## 2010-11-03 ENCOUNTER — Other Ambulatory Visit (INDEPENDENT_AMBULATORY_CARE_PROVIDER_SITE_OTHER): Payer: Medicare Other

## 2010-11-03 ENCOUNTER — Ambulatory Visit (INDEPENDENT_AMBULATORY_CARE_PROVIDER_SITE_OTHER): Payer: Medicare Other | Admitting: Internal Medicine

## 2010-11-03 DIAGNOSIS — L509 Urticaria, unspecified: Secondary | ICD-10-CM

## 2010-11-03 DIAGNOSIS — L501 Idiopathic urticaria: Secondary | ICD-10-CM

## 2010-11-03 DIAGNOSIS — J45909 Unspecified asthma, uncomplicated: Secondary | ICD-10-CM

## 2010-11-03 NOTE — Progress Notes (Signed)
Subjective:    Patient ID: Valerie Stevenson, female    DOB: December 14, 1945, 65 y.o.   MRN: [id number]  HPI    Review of Systems     Objective:   Physical Exam        Assessment & Plan:   Subjective:    Patient ID: Valerie Stevenson, female    DOB: 1945-07-14, 65 y.o.   MRN: [id number]  HPI 09/02/10- 23 yoF w/ hx urticaria complicated by asthma/ COPD, food allergy to shellfish, RAI for thyroid, DM Last here 08/17/10 for initial visit for Dr Alwyn Ren, with onset of hives 3 weeks previously. Discussed chronic lisinopril. Better with hives but still getting some especially as she gets up in the morning- shower makes them more visible. She has been taking Allegra 180, with Zantac 300mg  bid. She remains itchy. Had tried doxepin 10- no help and didn't like it. Dr Alwyn Ren gave Ativan.  Allergy profile-  IgE 296, but no specific elevations, WBC 16,600; No eos. No overt infection.  11/03/10- 64 yoF w/ hx urticaria complicated by asthma/ COPD, food allergy to shellfish, RAI for thyroid, DM Off lisinopril and Coreg. Reports "anaphyllactic" reaction in June at beach. 8 hours after eating tuna sandwich had swollen neck and tongue. ER gave epi, benadryl, solumedrol. Now has some itching on arms since she has been off Zantac. She had continued daily Zantac 30 and Singulair.  Allergy profile 08/17/10- IgE 296.3, no specific elevations. Skin test- Significant positives to common inhalants.  Review of Systems Constitutional:   No weight loss, night sweats,  Fevers, chills, fatigue, lassitude. HEENT:   No headaches,  Difficulty swallowing,  Tooth/dental problems,  Sore throat,                No sneezing,, ear ache, nasal congestion, post nasal drip,   CV:  No chest pain,  Orthopnea, PND, swelling in lower extremities, anasarca, dizziness, palpitations  GI  No heartburn, indigestion, abdominal pain, nausea, vomiting, diarrhea, change in bowel habits, loss of appetite  Resp: No shortness of breath with exertion  or at rest.  No excess mucus, no productive cough,  No non-productive cough,  No coughing up of blood.  No change in color of mucus.  No wheezing.   Skin: a little itching, no rash.     Hx post herpetic neuralgia right trunk. GU: no dysuria, change in color of urine, no urgency or frequency.  No flank pain.  MS:  No joint pain or swelling.  No decreased range of motion.  No back pain.  Psych:  No change in mood or affect. No depression or anxiety.  No memory loss.      Objective:   Physical Exam General- Alert, Oriented, Affect-appropriate, Distress- none acute,   obese  Skin- rash-none, lesions- none, excoriation- none.  Dry, but no hives.  Lymphadenopathy- none  Head- atraumatic  Eyes- Gross vision intact, PERRLA, conjunctivae clear secretions  Ears- Hearing, canals, Tm - normal  Nose- Clear, No-Septal dev, mucus, polyps, erosion, perforation   Throat- Mallampati II , mucosa clear , drainage- none, tonsils- atrophic  Neck- flexible , trachea midline, no stridor , thyroid nl, carotid no bruit  Chest - symmetrical excursion , unlabored     Heart/CV- RRR , no murmur , no gallop  , no rub, nl s1 s2                     - JVD- none , edema- none, stasis changes- none, varices- none  Lung- clear to P&A, wheeze- none, cough- none , dullness-none, rub- none     Chest wall- Abd- tender-no, distended-no, bowel sounds-present, HSM- no  Br/ Gen/ Rectal- Not done, not indicated  Extrem- cyanosis- none, clubbing, none, atrophy- none, strength- nl  Neuro- grossly intact to observation         Assessment & Plan:

## 2010-11-03 NOTE — Patient Instructions (Signed)
Loratadine  X 1 now  Order- lab-  Food profile 4630                     IgG Food panel  4557                     Seafood panel    3459                     Sed rate                     ANA              Dx urticaria  Consider keeping a food diary  For 2 months- record everything you eat and when you have symptoms, so you can look for patterns  Reduce Zantac to 150 mg   Twice daily      And continue Singulair.

## 2010-11-05 LAB — ALLERGEN FOOD PROFILE SPECIFIC IGE
Apple: <0.1 kU/L (ref <0.35)
Chicken IgE: <0.1 kU/L (ref <0.35)
Corn: <0.1 kU/L (ref <0.35)
Orange: <0.1 kU/L (ref <0.35)
Soybean IgE: <0.1 kU/L (ref <0.35)

## 2010-11-07 NOTE — Assessment & Plan Note (Addendum)
Reaction described 8 hours after eating seafood, is long interval for IgE mediated food reaction and may have been triggered by something else, or mediated by IgG. We will re-evaluate in vitro allergy profile. She now has an Epipen.

## 2010-11-08 ENCOUNTER — Encounter: Payer: Self-pay | Admitting: Endocrinology

## 2010-11-08 ENCOUNTER — Ambulatory Visit (INDEPENDENT_AMBULATORY_CARE_PROVIDER_SITE_OTHER): Payer: Medicare Other | Admitting: Endocrinology

## 2010-11-08 ENCOUNTER — Other Ambulatory Visit: Payer: Medicare Other

## 2010-11-08 ENCOUNTER — Other Ambulatory Visit (INDEPENDENT_AMBULATORY_CARE_PROVIDER_SITE_OTHER): Payer: Medicare Other

## 2010-11-08 VITALS — BP 122/60 | HR 92 | Temp 98.0°F | Ht 66.0 in | Wt 236.4 lb

## 2010-11-08 DIAGNOSIS — E0590 Thyrotoxicosis, unspecified without thyrotoxic crisis or storm: Secondary | ICD-10-CM

## 2010-11-08 DIAGNOSIS — E119 Type 2 diabetes mellitus without complications: Secondary | ICD-10-CM

## 2010-11-08 NOTE — Progress Notes (Signed)
Subjective:    Patient ID: Valerie Stevenson, female    DOB: [birth year], 65 y.o.   MRN: [id number]  HPI The state of at least three ongoing medical problems is addressed today:  Pt is 4 mos s/p i-131 rx for hyperthyroidism: She has lost a few lbs. Multinodular goiter. she says she now notices it, especially on the right.   Dm: she brings a record of her cbg's which i have reviewed today. It varies from 98-400.  It is in general highest in am (she does not check at hs), and lowest in the afternoon.  She was in the hospital x 1 night at the beach, and she received steroids.  This is when cbg's were highest.   Past Medical History  Diagnosis Date  . Diabetes mellitus   . Toxic multinodular goiter   . OSA (obstructive sleep apnea)     CPAP  . Glaucoma   . Renal disease   . Anemia     iron deficinecy  . Asthma   . COPD (chronic obstructive pulmonary disease)   . Depression   . GERD (gastroesophageal reflux disease)   . Hyperlipidemia   . Hypertension   . Hyperthyroidism     in context of nodule; Tapazole therapy since 2007  . Skin cancer   . Diverticulosis of colon   . Urticaria, idiopathic     Past Surgical History  Procedure Date  . Total abdominal hysterectomy w/ bilateral salpingoophorectomy   . Replacement total knee 2005    left     History   Social History  . Marital Status: Married    Spouse Name: N/A    Number of Children: N/A  . Years of Education: N/A   Occupational History  . RN-Retired    Social History Main Topics  . Smoking status: Never Smoker   . Smokeless tobacco: Not on file  . Alcohol Use: No  . Drug Use: No  . Sexually Active: Not on file   Other Topics Concern  . Not on file   Social History Narrative  . No narrative on file    Current Outpatient Prescriptions on File Prior to Visit  Medication Sig Dispense Refill  . amLODipine (NORVASC) 5 MG tablet Take 5 mg by mouth 2 (two) times daily.        . B-D UF III MINI PEN NEEDLES 31G X 5 MM  MISC As directed      . buPROPion (WELLBUTRIN XL) 150 MG 24 hr tablet Take 150 mg by mouth daily.        . cloNIDine (CATAPRES) 0.1 MG tablet Take 0.1 mg by mouth 2 (two) times daily.        . cyclobenzaprine (FLEXERIL) 5 MG tablet Take 1 tablet (5 mg total) by mouth every 8 (eight) hours as needed for muscle spasms (1-2 @ bedtime as needed). 1-2 qhs prn  20 tablet  0  . furosemide (LASIX) 20 MG tablet Take 20 mg by mouth as directed. 1/2 by mouth daily       . Glucose Blood (PRECISION XTRA TEST VI)        . insulin aspart (NOVOLOG FLEXPEN) 100 UNIT/ML injection Inject into the skin 3 (three) times daily before meals. 02-07-19 units       . insulin glargine (LANTUS) 100 UNIT/ML injection Inject 50 Units into the skin at bedtime.  20 mL  11  . Insulin Syringe-Needle U-100 (RELION INSULIN SYR 0.5ML/31G) 31G X 5/16" 0.5 ML MISC by Does  not apply route.        Marland Kitchen LORazepam (ATIVAN) 0.5 MG tablet as needed.        . methimazole (TAPAZOLE) 10 MG tablet 3 tabs 2x a day.  180 tablet  11  . montelukast (SINGULAIR) 10 MG tablet Take 1 tablet (10 mg total) by mouth daily.  30 tablet  2  . pravastatin (PRAVACHOL) 40 MG tablet Take 1 tablet (40 mg total) by mouth daily.  90 tablet  1  . RELION ULTRA THIN PLUS LANCETS MISC As directed       . spironolactone (ALDACTONE) 50 MG tablet Take 1 tablet (50 mg total) by mouth daily.  30 tablet  6  . ranitidine (ZANTAC) 150 MG tablet Take 2 tablets (300 mg total) by mouth 2 (two) times daily.  60 tablet  4    Allergies  Allergen Reactions  . Latex     rash  . Morphine And Related Itching  . Penicillins     REACTION: Hives  . Shellfish-Derived Products     Anaphylactoid reaction     Family History  Problem Relation Age of Onset  . Coronary artery disease Father   . Stroke Mother   . Diabetes Mother   . Ovarian cancer Sister     twin sibling  . Alcohol abuse Maternal Grandfather   . Hyperthyroidism Sister     twin  . Goiter Sister     resected    BP  122/60  Pulse 92  Temp(Src) 98 F (36.7 C) (Oral)  Ht 5\' 6"  (1.676 m)  Wt 236 lb 6.4 oz (107.23 kg)  BMI 38.16 kg/m2  SpO2 96%  Review of Systems denies hypoglycemia and fever    Objective:   Physical Exam GENERAL: no distress  Thyroid: large goiter,right >> left.     Lab Results  Component Value Date   TSH 0.06* 11/08/2010    Assessment & Plan:  Hyperthyroidism.  Improvement is prob due to tapazole Multinodular goiter, unchanged Dm, needs increased rx

## 2010-11-08 NOTE — Patient Instructions (Addendum)
blood tests are being ordered for you today.  please call [phone] to hear your test results.  You will be prompted to enter the 9-digit "MRN" number that appears at the top left of this page, followed by #.  Then you will hear the message. pending the test results, please increase novolog to (just before each meal) 02-07-24, and the same lantus.   Stop the methimazole. Please make a follow-up appointment in 1 month. Refer to a dietician.  you will be called with a day and time for an appointment (update: i left message on phone-tree:  rx as we discussed.  Thyroid is not better yet).

## 2010-11-11 ENCOUNTER — Telehealth: Payer: Self-pay | Admitting: *Deleted

## 2010-11-11 ENCOUNTER — Encounter: Payer: Self-pay | Admitting: Internal Medicine

## 2010-11-11 NOTE — Telephone Encounter (Signed)
Pt c/o elevated BSLs after increase in Novolog: reporting still in 200s & 300s Before Breakfast today [192 fasting] Pt ate oatmeal & fruit for breakfast: AC [347] Pt inquiring as to if a change in medication is needed as she is concerned as to why her numbers are still high.?

## 2010-11-11 NOTE — Telephone Encounter (Signed)
Increase novolog to tid (qac) 15-20-30 units. Call next week if still high

## 2010-11-12 NOTE — Telephone Encounter (Signed)
Left message for pt to callback office.  

## 2010-11-12 NOTE — Telephone Encounter (Signed)
Pt informed. Will monitor BSL and update Monday.

## 2010-11-15 ENCOUNTER — Telehealth: Payer: Self-pay | Admitting: *Deleted

## 2010-11-15 NOTE — Telephone Encounter (Signed)
Pt states that her blood sugars get higher as the day progresses, 200s in the mornings and high 200 -300 in the evenings   Please call back - [phone]

## 2010-11-15 NOTE — Telephone Encounter (Signed)
please call patient: What time of day is cbg highest?

## 2010-11-15 NOTE — Telephone Encounter (Signed)
Update: Patient still having problems w/readings on increased insulin dosage Saturday was [facility] w/[147] before dinner & two hours later at [122] Sunday was at [231] & [273] Pt states she had fried chicken ["first time in a long time"] and mashed potatoes, small amount of cole slaw and beans. Had cheese ravioli & salad, half a bagel whole wheat earlier in the day. Patient is concerned about her readings and would like to know if she needs a new Insulin.?  SHARON SCATES, CMA 11/12/2010 1:11 PM Signed  Pt informed. Will monitor BSL and update Monday. Romero Belling, MD 11/11/2010 1:28 PM Signed  Increase novolog to tid (qac) 15-20-30 units.  Call next week if still high SHARON SCATES, CMA 11/11/2010 12:47 PM Signed  Pt c/o elevated BSLs after increase in Novolog: reporting still in 200s & 300s  Before Breakfast today [192 fasting]  Pt ate oatmeal & fruit for breakfast: AC [347]  Pt inquiring as to if a change in medication is needed as she is concerned as to why her numbers are still high.?

## 2010-11-15 NOTE — Telephone Encounter (Signed)
Left message on machine for pt to return my call  

## 2010-11-15 NOTE — Telephone Encounter (Signed)
Increase novolog to tid (qac) 25-30-40 units

## 2010-11-16 ENCOUNTER — Other Ambulatory Visit: Payer: Self-pay

## 2010-11-16 MED ORDER — INSULIN ASPART 100 UNIT/ML ~~LOC~~ SOLN: SUBCUTANEOUS | Status: DC

## 2010-11-16 NOTE — Telephone Encounter (Signed)
Pt advised of insulin increase in detail

## 2010-11-16 NOTE — Telephone Encounter (Signed)
Pt called requesting refill of Insulin with updated units

## 2010-12-08 ENCOUNTER — Other Ambulatory Visit: Payer: Self-pay | Admitting: Internal Medicine

## 2010-12-15 ENCOUNTER — Other Ambulatory Visit (INDEPENDENT_AMBULATORY_CARE_PROVIDER_SITE_OTHER): Payer: Medicare Other

## 2010-12-15 ENCOUNTER — Ambulatory Visit (INDEPENDENT_AMBULATORY_CARE_PROVIDER_SITE_OTHER): Payer: Medicare Other | Admitting: Endocrinology

## 2010-12-15 ENCOUNTER — Encounter: Payer: Self-pay | Admitting: Endocrinology

## 2010-12-15 ENCOUNTER — Ambulatory Visit (INDEPENDENT_AMBULATORY_CARE_PROVIDER_SITE_OTHER): Payer: Medicare Other | Admitting: Internal Medicine

## 2010-12-15 ENCOUNTER — Encounter: Payer: Self-pay | Admitting: Internal Medicine

## 2010-12-15 VITALS — BP 148/62 | HR 86 | Ht 66.0 in | Wt 240.0 lb

## 2010-12-15 VITALS — BP 130/70 | HR 76 | Temp 98.3°F | Ht 66.0 in | Wt 238.4 lb

## 2010-12-15 DIAGNOSIS — L501 Idiopathic urticaria: Secondary | ICD-10-CM

## 2010-12-15 DIAGNOSIS — E0590 Thyrotoxicosis, unspecified without thyrotoxic crisis or storm: Secondary | ICD-10-CM

## 2010-12-15 LAB — HEPATIC FUNCTION PANEL
ALT: 11 U/L (ref 0–35)
Total Bilirubin: 0.9 mg/dL (ref 0.3–1.2)
Total Protein: 7.1 g/dL (ref 6.0–8.3)

## 2010-12-15 LAB — TSH: TSH: 0.2 u[IU]/mL — ABNORMAL LOW (ref 0.35–5.50)

## 2010-12-15 MED ORDER — LEVOCETIRIZINE DIHYDROCHLORIDE 5 MG PO TABS: 5.0000 mg | ORAL_TABLET | Freq: Every evening | ORAL | Status: DC

## 2010-12-15 NOTE — Patient Instructions (Addendum)
blood tests are being ordered for you today.  please call [phone] to hear your test results.  You will be prompted to enter the 9-digit "MRN" number that appears at the top left of this page, followed by #.  Then you will hear the message. pending the test results, please increase novolog to (just before each meal) 25-25-50, and the same lantus.   Please make a follow-up appointment in 1 month. Your thyroid problem does not cause the skin problem.  However, it can cause symptoms of itching, making your symptoms worse.

## 2010-12-15 NOTE — Patient Instructions (Signed)
Script- add Xyzal antihistamine  [facility]    Allergy- for urticaria  Lab- Liver enzyme panel

## 2010-12-15 NOTE — Progress Notes (Signed)
Subjective:    Patient ID: Valerie Stevenson, female    DOB: 1945-12-10, 65 y.o.   MRN: [id number]  HPI    Review of Systems     Objective:   Physical Exam        Assessment & Plan:   Subjective:    Patient ID: Valerie Stevenson, female    DOB: [birth year], 65 y.o.   MRN: [id number]  HPI 09/02/10- 1 yoF w/ hx urticaria complicated by asthma/ COPD, food allergy to shellfish, RAI for thyroid Last here 08/17/10 for initial visit for Dr Alwyn Ren, with onset of hives 3 weeks previously. Discussed chronic lisinopril. Better with hives but still getting some especially as she gets up in the morning- shower makes them more visible. She has been taking Allegra 180, with Zantac 300mg  bid. She remains itchy. Had tried doxepin 10- no help and didn't like it. Dr Alwyn Ren gave Ativan.  Allergy profile-  IgE 296, but no specific elevations, WBC 16,600; No eos. No overt infection.  12/15/10- 65 yoF w/ hx urticaria complicated by asthma/ COPD, food allergy to shellfish, RAI for thyroid Continues to complain of itching with visible hives every day. Dr Everardo All felt her thyroid hormone levels were not the problem, but the underlying cause of her thyroid disease may be related. She has tried zantac now at 150 twice daily, with Singulair. She came off allegra, which wasn't helping. Doxepin didn't help and not well tolerated. She has used Ativan to reduce irritability but it makes her too sleepy. I explained that urticaria may not be allergic, may be related to any of her medications, and may be related to her thyroid or diabetes disorders. There has not been evidence of malignancy. CT chest was negative except for thyroid nodule. Sedimentation rate was elevated at 57. ANA was negative. IgE was elevated at 266.1 but food profile was negative. Specific seafood profile showed mild elevations for crab and shrimp. IgG food profile results are being sought.  Review of Systems Constitutional:   No weight loss, night sweats,   Fevers, chills, fatigue, lassitude. HEENT:   No headaches,  Difficulty swallowing,  Tooth/dental problems,  Sore throat,                No sneezing,, ear ache, nasal congestion, post nasal drip,  CV:  No chest pain,  Orthopnea, PND, swelling in lower extremities, anasarca, dizziness, palpitations GI  No heartburn, indigestion, abdominal pain, nausea, vomiting, diarrhea, change in bowel habits, loss of appetite Resp: No shortness of breath with exertion or at rest.  No excess mucus, no productive cough,  No non-productive cough,  No coughing up of blood.  No change in color of mucus.  No wheezing.  Skin:GU: no dysuria, change in color of urine, no urgency or frequency.  No flank pain. MS:  No joint pain or swelling.  No decreased range of motion.  No back pain. Psych:  No change in mood or affect. No depression or anxiety.  No memory loss.      Objective:   Physical Exam General- Alert, Oriented, Affect-appropriate, Distress- none acute  Obese   Scratching at herself Skin- rash-none, lesions- none, excoriation/ redness on arms  Lymphadenopathy- none Head- atraumatic            Eyes- Gross vision intact, PERRLA, conjunctivae clear secretions            Ears- Hearing, canals normal            Nose- Clear, no-Septal dev, mucus, polyps,  erosion, perforation             Throat- Mallampati II , mucosa clear , drainage- none, tonsils- atrophic Neck- flexible , trachea midline, no stridor , thyroid nl, carotid no bruit Chest - symmetrical excursion , unlabored           Heart/CV- RRR , no murmur , no gallop  , no rub, nl s1 s2                           - JVD- none , edema- none, stasis changes- none, varices- none           Lung- clear to P&A, wheeze- none, cough- none , dullness-none, rub- none           Chest wall-  Abd- tender-no, distended-no, bowel sounds-present, HSM- no Br/ Gen/ Rectal- Not done, not indicated Extrem- cyanosis- none, clubbing, none, atrophy- none, strength- nl Neuro-  grossly intact to observation         Assessment & Plan:

## 2010-12-15 NOTE — Progress Notes (Signed)
Subjective:    Patient ID: Valerie Stevenson, female    DOB: 08-02-1945, 65 y.o.   MRN: [id number]  HPI The state of at least three ongoing medical problems is addressed today:  Pt is 5 mos s/p i-131 rx for hyperthyroidism: pt states she feels well in general, except for itching, for which she will see dr young this am.   Multinodular goiter. she says she still notices it, especially on the right.  Dm: she brings a record of her cbg's which i have reviewed today.  She had 1 episode of mild hypoglycemia in the afternoon (54).   It varies from 98-250. It is in general highest at hs, and in am, and lowest in the afternoon.   Past Medical History  Diagnosis Date  . Diabetes mellitus   . Toxic multinodular goiter   . OSA (obstructive sleep apnea)     CPAP  . Glaucoma   . Renal disease   . Anemia     iron deficinecy  . Asthma   . COPD (chronic obstructive pulmonary disease)   . Depression   . GERD (gastroesophageal reflux disease)   . Hyperlipidemia   . Hypertension   . Hyperthyroidism     in context of nodule; Tapazole therapy since 2007  . Skin cancer   . Diverticulosis of colon   . Urticaria, idiopathic     Past Surgical History  Procedure Date  . Total abdominal hysterectomy w/ bilateral salpingoophorectomy   . Replacement total knee 2005    left     History   Social History  . Marital Status: Married    Spouse Name: N/A    Number of Children: N/A  . Years of Education: N/A   Occupational History  . RN-Retired    Social History Main Topics  . Smoking status: Never Smoker   . Smokeless tobacco: Not on file  . Alcohol Use: No  . Drug Use: No  . Sexually Active: Not on file   Other Topics Concern  . Not on file   Social History Narrative  . No narrative on file    Current Outpatient Prescriptions on File Prior to Visit  Medication Sig Dispense Refill  . amLODipine (NORVASC) 5 MG tablet Take 5 mg by mouth 2 (two) times daily.        . B-D UF III MINI PEN  NEEDLES 31G X 5 MM MISC As directed      . buPROPion (WELLBUTRIN XL) 150 MG 24 hr tablet Take 150 mg by mouth daily.        . cloNIDine (CATAPRES) 0.1 MG tablet Take 0.1 mg by mouth 2 (two) times daily.        . cyclobenzaprine (FLEXERIL) 5 MG tablet Take 1 tablet (5 mg total) by mouth every 8 (eight) hours as needed for muscle spasms (1-2 @ bedtime as needed). 1-2 qhs prn  20 tablet  0  . furosemide (LASIX) 20 MG tablet Take 20 mg by mouth as directed. 1/2 by mouth daily       . Glucose Blood (PRECISION XTRA TEST VI)        . insulin aspart (NOVOLOG FLEXPEN) 100 UNIT/ML injection 25-30-40 units  30 mL  1  . insulin glargine (LANTUS) 100 UNIT/ML injection Inject 50 Units into the skin at bedtime.  20 mL  11  . Insulin Syringe-Needle U-100 (RELION INSULIN SYR 0.5ML/31G) 31G X 5/16" 0.5 ML MISC by Does not apply route.        Marland Kitchen  LORazepam (ATIVAN) 0.5 MG tablet as needed.        . ranitidine (ZANTAC) 150 MG tablet Take 2 tablets (300 mg total) by mouth 2 (two) times daily.  60 tablet  4  . RELION ULTRA THIN PLUS LANCETS MISC As directed       . SINGULAIR 10 MG tablet TAKE ONE TABLET BY MOUTH EVERY DAY  30 each  0  . spironolactone (ALDACTONE) 50 MG tablet Take 1 tablet (50 mg total) by mouth daily.  30 tablet  6    Allergies  Allergen Reactions  . Latex     rash  . Morphine And Related Itching  . Penicillins     REACTION: Hives  . Shellfish-Derived Products     Anaphylactoid reaction     Family History  Problem Relation Age of Onset  . Coronary artery disease Father   . Stroke Mother   . Diabetes Mother   . Ovarian cancer Sister     twin sibling  . Alcohol abuse Maternal Grandfather   . Hyperthyroidism Sister     twin  . Goiter Sister     resected    BP 130/70  Pulse 76  Temp(Src) 98.3 F (36.8 C) (Oral)  Ht 5\' 6"  (1.676 m)  Wt 238 lb 6.4 oz (108.138 kg)  BMI 38.48 kg/m2  SpO2 97%  Review of Systems Denies fever.  She has regained a few lbs    Objective:   Physical  Exam GENERAL: no distress Neck: thyroid feels full on the right, but i can't tell details.    Assessment & Plan:  Dm, she needs some adjustment in her therapy Hyperthyroidism.  She may need another done of i-131 rx. Multinodular goiter, unchanged on exam Itching, which could be exac by hyperthyroidism.

## 2010-12-15 NOTE — Assessment & Plan Note (Addendum)
Food diary, skin tests and in vitro IgE tests have failed to show significant connection with foods. She has thyroid disease, still being worked on , and diabetes, both of which may have some relation to her hives. She has changed meds, but not been fully off. I will try adding Xyzal, since she had been off Allegra while continuing Singulair and Zantac. We will check liver enzymes and make referral to [facility], respecting her mounting frustration.

## 2010-12-21 ENCOUNTER — Other Ambulatory Visit: Payer: Self-pay | Admitting: *Deleted

## 2010-12-21 MED ORDER — FUROSEMIDE 20 MG PO TABS: ORAL_TABLET | ORAL | Status: DC

## 2010-12-30 ENCOUNTER — Ambulatory Visit (INDEPENDENT_AMBULATORY_CARE_PROVIDER_SITE_OTHER): Payer: Medicare Other | Admitting: Internal Medicine

## 2010-12-30 ENCOUNTER — Encounter: Payer: Self-pay | Admitting: Internal Medicine

## 2010-12-30 VITALS — BP 144/60 | HR 90 | Wt 245.4 lb

## 2010-12-30 DIAGNOSIS — Z23 Encounter for immunization: Secondary | ICD-10-CM

## 2010-12-30 DIAGNOSIS — J45909 Unspecified asthma, uncomplicated: Secondary | ICD-10-CM

## 2010-12-30 DIAGNOSIS — G4733 Obstructive sleep apnea (adult) (pediatric): Secondary | ICD-10-CM

## 2010-12-30 DIAGNOSIS — L501 Idiopathic urticaria: Secondary | ICD-10-CM

## 2010-12-30 NOTE — Patient Instructions (Signed)
I will place order to establish you with a DME company for replacement CPAP at 10 and for new mask and supplies, when we get the report of your original sleep study  I hope you find the doctor in [facility] helpful for your hives.  Samples of # 4,       Lunesta 3 mg  To try one at bedtime as needed for sleep.    Flu vax

## 2010-12-30 NOTE — Assessment & Plan Note (Addendum)
Pending arrival of her previous NPSG, We will order replacement CPAP machine at 10 cwp. Include humidifier but she might choose not to use it. Replacement mask and supplies. Educational talk done.

## 2010-12-30 NOTE — Progress Notes (Signed)
Subjective:    Patient ID: Leonette Monarch, female    DOB: Apr 29, 1945, 65 y.o.   MRN: [id number]  HPI    Review of Systems     Objective:   Physical Exam        Assessment & Plan:   Subjective:    Patient ID: Ashlynne Shetterly, female    DOB: 1946-04-24, 65 y.o.   MRN: [id number]  HPI    Review of Systems     Objective:   Physical Exam        Assessment & Plan:   Subjective:    Patient ID: Tearah Saulsbury, female    DOB: 09-17-1945, 65 y.o.   MRN: [id number]  HPI 09/02/10- 73 yoF w/ hx urticaria complicated by asthma/ COPD, food allergy to shellfish, RAI for thyroid Last here 08/17/10 for initial visit for Dr Alwyn Ren, with onset of hives 3 weeks previously. Discussed chronic lisinopril. Better with hives but still getting some especially as she gets up in the morning- shower makes them more visible. She has been taking Allegra 180, with Zantac 300mg  bid. She remains itchy. Had tried doxepin 10- no help and didn't like it. Dr Alwyn Ren gave Ativan.  Allergy profile-  IgE 296, but no specific elevations, WBC 16,600; No eos. No overt infection.  12/15/10- 64 yoF w/ hx urticaria complicated by asthma/ COPD, food allergy to shellfish, RAI for thyroid Continues to complain of itching with visible hives every day. Dr Everardo All felt her thyroid hormone levels were not the problem, but the underlying cause of her thyroid disease may be related. She has tried zantac now at 150 twice daily, with Singulair. She came off allegra, which wasn't helping. Doxepin didn't help and not well tolerated. She has used Ativan to reduce irritability but it makes her too sleepy. I explained that urticaria may not be allergic, may be related to any of her medications, and may be related to her thyroid or diabetes disorders. There has not been evidence of malignancy. CT chest was negative except for thyroid nodule. Sedimentation rate was elevated at 57. ANA was negative. IgE was elevated at 266.1 but food  profile was negative. Specific seafood profile showed mild elevations for crab and shrimp. IgG food profile results are being sought.  12/29/20-64 yoF w/ hx urticaria complicated by asthma/ COPD, food allergy to shellfish, RAI for thyroid Here today for new problem- concern of sleep disorder Documented hx OSA on CPAP. Needs to establish here for update Rx for machine and supplies. Her current CPAP at 10 is used all night, every night. She had a sleep study in [facility] [place] to establish the diagnosis. Her current machine is 57 or 66 years old and needs to be replaced. She has not been using a humidifier. Nasal pillows mask. She is satisfied with her treatment and only asks that we help get her replacement machine and maintain followup through this office. Sleep hygiene: Bedtime between 9 and 10 PM, sleep latency 10-15 minutes, waking 2 or 3 times each night for finally up between 7:30 and 8 AM. Weight has gone up 25 pounds in the last 2 years. She feels restless at night and will get up to sit on the side of the bed. This is mostly do to her ongoing urticaria. No history of ENT surgery.  Problem of urticaria is ongoing. She is pending allergy evaluation at [facility]. I suspect relationship to her thyroid disease. Other medical problems include kidney disease elevated cholesterol diabetes palpitations and hypertension. There  is no history of heart or lung disease otherwise. Lab- IgG food allergy panel showed elevations for milk and egg white. She understands this information is not firmly validated, and is of value only to raise awareness. She will try to avoid these for a month and see if that affects her urticaria. She does still believe that some food is the basis for her hives, although we have discussed other etiologies.   Review of Systems Constitutional:   No weight loss, night sweats,  Fevers, chills, fatigue, lassitude. HEENT:   No headaches,  Difficulty swallowing,  Tooth/dental problems,   Sore throat,                No sneezing,, ear ache, nasal congestion, post nasal drip,  CV:  No chest pain,  Orthopnea, PND, swelling in lower extremities, anasarca, dizziness, palpitations GI  No heartburn, indigestion, abdominal pain, nausea, vomiting, diarrhea, change in bowel habits, loss of appetite Resp: No shortness of breath with exertion or at rest.  No excess mucus, no productive cough,  No non-productive cough,  No coughing up of blood.  No change in color of mucus.  No wheezing.  Skin:HPI and prior visits. GU: no dysuria, change in color of urine, no urgency or frequency.  No flank pain. MS:  No joint pain or swelling.  No decreased range of motion.  No back pain. Psych:  No change in mood or affect. No depression or anxiety.  No memory loss.      Objective:   Physical Exam General- Alert, Oriented, Affect-appropriate, Distress- none acute  Obese   Less scratching at herself Skin- rash-none, lesions- none,+ excoriation/ redness on arms  Lymphadenopathy- none Head- atraumatic            Eyes- Gross vision intact, PERRLA, conjunctivae clear secretions            Ears- Hearing, canals normal            Nose- Clear, no-Septal dev, mucus, polyps, erosion, perforation             Throat- Mallampati III , mucosa clear , drainage- none, tonsils- atrophic Neck- flexible , trachea midline, no stridor , thyroid nl, carotid no bruit Chest - symmetrical excursion , unlabored           Heart/CV- RRR , no murmur , no gallop  , no rub, nl s1 s2                           - JVD- none , edema- none, stasis changes- none, varices- none           Lung- clear to P&A, wheeze- none, cough- none , dullness-none, rub- none           Chest wall-  Abd- tender-no, distended-no, bowel sounds-present, HSM- no Br/ Gen/ Rectal- Not done, not indicated Extrem- cyanosis- none, clubbing, none, atrophy- none, strength- nl Neuro- grossly intact to observation except chronic droop right corner of mouth          Assessment & Plan:

## 2011-01-02 NOTE — Assessment & Plan Note (Signed)
Well controlled 

## 2011-01-02 NOTE — Assessment & Plan Note (Signed)
Avoid or watch very carefully suspect foods. Continue antihistamine therapy. Keep appointment for allergy evaluation at [facility]. Still the possibility urticaria may be related to ongoing medical problems including thyroid and diabetes.

## 2011-01-05 ENCOUNTER — Telehealth: Payer: Self-pay | Admitting: *Deleted

## 2011-01-05 NOTE — Telephone Encounter (Signed)
Patient requesting labs to check kidney/liver function [new medicines] Pt c/o having swelling in feet [constantly in left foot] but Right foot is still worse. Scheduled OV 01/06/11 @2 :30pm

## 2011-01-06 ENCOUNTER — Telehealth: Payer: Self-pay | Admitting: Internal Medicine

## 2011-01-06 ENCOUNTER — Encounter: Payer: Self-pay | Admitting: Internal Medicine

## 2011-01-06 ENCOUNTER — Ambulatory Visit (INDEPENDENT_AMBULATORY_CARE_PROVIDER_SITE_OTHER): Payer: Medicare Other | Admitting: Internal Medicine

## 2011-01-06 DIAGNOSIS — I10 Essential (primary) hypertension: Secondary | ICD-10-CM

## 2011-01-06 DIAGNOSIS — R609 Edema, unspecified: Secondary | ICD-10-CM

## 2011-01-06 NOTE — Telephone Encounter (Signed)
Pt called back.  Pt states she was just seen by PCP today and she had informed him CY gave her samples of Lunesta to try.  She states PCP was against pt taking the [facility] and does not want pt on this med.  Therefore, pt states she doesn't want rx called in.  Just requests this message forwarded to CY as an FYI.

## 2011-01-06 NOTE — Telephone Encounter (Signed)
Pt last saw CY on 9/6 and was given samples of Lunesta 3mg .  [id number].

## 2011-01-06 NOTE — Patient Instructions (Signed)
Your BP goal = AVERAGE < 135/85. Avoid ingestion of  excess salt/sodium.Cook with pepper & other spices . Use the salt substitute "No Salt"(unless your potassium has been elevated) OR the Mrs Sharilyn Sites products to season food @ the table. Avoid foods which taste salty or "vinegary" as their sodium contentet will be high.   Wear the support when up ambulating during the day.

## 2011-01-06 NOTE — Progress Notes (Signed)
  Subjective:    Patient ID: Valerie Stevenson, female    DOB: 1945/11/02, 65 y.o.   MRN: [id number]  HPI Edema Onset:< 12 months Location : L > R Trigger: limb dependency Better with: elevation Constitutional: no significant  fatigue; sleep disturbance  due to  nocturia  , > 2X  . On CPAP for several years, > 5 years.Dr Maple Hudson stated settings adequate                                              Cardiovascular: no palpitations; tachycardia; claudication; paroxysmal nocturnal dyspnea Endocrinologic:weight up due to Tapazole; temperature intolerance to heat ;no  skin/hair/nail changes Possible triggers: no salt  excess; new medications (Note: off Tapazole now)     Review of Systems     Objective:   Physical Exam in no acute  distress.  Eyes: No conjunctival inflammation or scleral icterus is present.  Oral exam: Dental hygiene is good; lips and gums are healthy appearing.There is no oropharyngeal erythema or exudate noted. Oropharyngeal crowding   Heart:  Normal rate and regular rhythm. S1 and S2 normal without gallop,click, rub or other extra sounds . Grade 1/2 systolic murmur . No NVD @ 15 degrees   Lungs:Chest clear to auscultation; no wheezes, rhonchi,rales ,or rubs present.No increased work of breathing.   Abdomen: bowel sounds normal, soft and non-tender without masses, organomegaly or hernias noted.  No guarding or rebound . No HJR  All pulses are intact; she has no bruits. There is no significant edema at this time.  Skin:Warm & dry. She has scattered urticarial lesions most notable in the abdomen. Surprisingly she does not exhibit dermatographia.   Lymphatic: No lymphadenopathy is noted about the head, neck, axilla             Assessment & Plan:  #1 edema; this is most likely related to venous insufficiency based on its resolution overnight. The fact that it is not worse overnight mitigates against uncontrolled sleep apnea. Clinically there is no evidence of heart  failure.  She is on amlodipine; this is not likely to be causing swelling based on clinical findings today.  #2 urticaria, as per [facility]. It would be in her best interest to wean and discontinue as many medications and supplements as possible.  Plan: If the edema is significant issue for her; knee-high support hose would be recommended. Problematic is her latex allergy.

## 2011-01-07 NOTE — Telephone Encounter (Signed)
Noted  

## 2011-01-18 ENCOUNTER — Ambulatory Visit (INDEPENDENT_AMBULATORY_CARE_PROVIDER_SITE_OTHER): Payer: Medicare Other | Admitting: Endocrinology

## 2011-01-18 ENCOUNTER — Encounter: Payer: Self-pay | Admitting: Endocrinology

## 2011-01-18 ENCOUNTER — Other Ambulatory Visit (INDEPENDENT_AMBULATORY_CARE_PROVIDER_SITE_OTHER): Payer: Medicare Other

## 2011-01-18 DIAGNOSIS — E0590 Thyrotoxicosis, unspecified without thyrotoxic crisis or storm: Secondary | ICD-10-CM

## 2011-01-18 DIAGNOSIS — E119 Type 2 diabetes mellitus without complications: Secondary | ICD-10-CM

## 2011-01-18 LAB — HEMOGLOBIN A1C: Hgb A1c MFr Bld: 7.3 % — ABNORMAL HIGH (ref 4.6–6.5)

## 2011-01-18 NOTE — Progress Notes (Signed)
Subjective:    Patient ID: Valerie Stevenson, female    DOB: 1945/05/23, 65 y.o.   MRN: [id number]  HPI The state of at least three ongoing medical problems is addressed today:   Pt is 6 mos s/p i-131 rx for hyperthyroidism: pt states she feels well in general.  Multinodular goiter. she says she still notices it, especially on the right.  Dm: she brings a record of her cbg's which i have reviewed today. She had several episodes of mild hypoglycemia in the afternoon, and at hs.   Past Medical History  Diagnosis Date  . Diabetes mellitus   . Toxic multinodular goiter   . OSA (obstructive sleep apnea)     CPAP  . Glaucoma   . Renal disease   . Anemia     iron deficinecy  . Asthma   . COPD (chronic obstructive pulmonary disease)   . Depression   . GERD (gastroesophageal reflux disease)   . Hyperlipidemia   . Hypertension   . Hyperthyroidism     in context of nodule; Tapazole therapy since 2007  . Skin cancer   . Diverticulosis of colon   . Urticaria, idiopathic     Past Surgical History  Procedure Date  . Total abdominal hysterectomy w/ bilateral salpingoophorectomy   . Replacement total knee 2005    left     History   Social History  . Marital Status: Married    Spouse Name: N/A    Number of Children: N/A  . Years of Education: N/A   Occupational History  . RN-Retired    Social History Main Topics  . Smoking status: Never Smoker   . Smokeless tobacco: Never Used  . Alcohol Use: No  . Drug Use: No  . Sexually Active: Not on file   Other Topics Concern  . Not on file   Social History Narrative  . No narrative on file    Current Outpatient Prescriptions on File Prior to Visit  Medication Sig Dispense Refill  . amLODipine (NORVASC) 5 MG tablet Take 5 mg by mouth 2 (two) times daily.       . B-D UF III MINI PEN NEEDLES 31G X 5 MM MISC As directed      . buPROPion (WELLBUTRIN XL) 150 MG 24 hr tablet Take 150 mg by mouth daily.        . cetirizine (ZYRTEC) 10 MG  tablet Take 10 mg by mouth. 1 by mouth in the pm       . cloNIDine (CATAPRES) 0.1 MG tablet Take 0.1 mg by mouth 2 (two) times daily.        . cyclobenzaprine (FLEXERIL) 5 MG tablet Take 1 tablet (5 mg total) by mouth every 8 (eight) hours as needed for muscle spasms (1-2 @ bedtime as needed). 1-2 qhs prn  20 tablet  0  . fexofenadine (ALLEGRA) 180 MG tablet Take 180 mg by mouth. 1 by mouth in the am       . Glucose Blood (PRECISION XTRA TEST VI)        . insulin aspart (NOVOLOG) 100 UNIT/ML injection 25-25-50 units       . insulin glargine (LANTUS) 100 UNIT/ML injection Inject 50 Units into the skin at bedtime.  20 mL  11  . Insulin Syringe-Needle U-100 (RELION INSULIN SYR 0.5ML/31G) 31G X 5/16" 0.5 ML MISC by Does not apply route.        Marland Kitchen levocetirizine (XYZAL) 5 MG tablet Take 1 tablet (5 mg  total) by mouth every evening. For itching  30 tablet  3  . LORazepam (ATIVAN) 0.5 MG tablet as needed.        . pravastatin (PRAVACHOL) 40 MG tablet Take 40 mg by mouth daily.        . ranitidine (ZANTAC) 150 MG tablet Take 2 tablets (300 mg total) by mouth 2 (two) times daily.  60 tablet  4  . RELION ULTRA THIN PLUS LANCETS MISC As directed       . SINGULAIR 10 MG tablet TAKE ONE TABLET BY MOUTH EVERY DAY  30 each  0  . spironolactone (ALDACTONE) 50 MG tablet Take 1 tablet (50 mg total) by mouth daily.  30 tablet  6  . furosemide (LASIX) 20 MG tablet 1/2 by mouth daily  15 tablet  3    Allergies  Allergen Reactions  . Latex     rash  . Penicillins     REACTION: Hives  . Shellfish-Derived Products     Anaphylactoid reaction   . Morphine And Related Itching    Family History  Problem Relation Age of Onset  . Coronary artery disease Father   . Stroke Mother   . Diabetes Mother   . Ovarian cancer Sister     twin sibling  . Alcohol abuse Maternal Grandfather   . Hyperthyroidism Sister     twin  . Goiter Sister     resected    BP 158/62  Pulse 82  Temp(Src) 98.1 F (36.7 C) (Oral)   Ht 5\' 6"  (1.676 m)  Wt 245 lb (111.131 kg)  BMI 39.54 kg/m2  SpO2 95%   Review of Systems She reports weight gain.  Denies loc    Objective:   Physical Exam VITAL SIGNS:  See vs page GENERAL: no distress.  Obese Neck: thyroid is 10x normal size, right > left.     Assessment & Plan:  Dm, overcontrolled large multinodular goiter, not improved with i-131 rx Htn, with ? Of situational component Hyperthyroidism.  She will prob need another dose if i-131 rx

## 2011-01-18 NOTE — Patient Instructions (Addendum)
blood tests are being ordered for you today.  please call [phone] to hear your test results.  You will be prompted to enter the 9-digit "MRN" number that appears at the top left of this page, followed by #.  Then you will hear the message. pending the test results, please decrease novolog to (just before each meal) 25-20-45, and the same lantus.   Please make a follow-up appointment in 4 months.   If the thyroid blood test today is close to normal, we can plan to recheck it in January.

## 2011-01-24 ENCOUNTER — Telehealth: Payer: Self-pay | Admitting: *Deleted

## 2011-01-24 DIAGNOSIS — E0590 Thyrotoxicosis, unspecified without thyrotoxic crisis or storm: Secondary | ICD-10-CM

## 2011-01-24 NOTE — Telephone Encounter (Signed)
Pt informed of MD's advisement. 

## 2011-01-24 NOTE — Telephone Encounter (Signed)
i ordered Ret 6 weeks later

## 2011-01-24 NOTE — Telephone Encounter (Signed)
Pt heard message on phone tree and wants to proceed with radioactive iodine tx for thyroid.

## 2011-01-31 ENCOUNTER — Encounter (HOSPITAL_COMMUNITY)
Admission: RE | Admit: 2011-01-31 | Discharge: 2011-01-31 | Disposition: A | Discharge status: Home / Self Care | Payer: Medicare Other | Source: Ambulatory Visit | Attending: Endocrinology | Admitting: Endocrinology

## 2011-01-31 DIAGNOSIS — E0590 Thyrotoxicosis, unspecified without thyrotoxic crisis or storm: Secondary | ICD-10-CM | POA: Insufficient documentation

## 2011-02-01 MED ORDER — SODIUM IODIDE I 131 CAPSULE
11.2000 | Freq: Once | INTRAVENOUS | Status: AC | PRN
  Administered 2011-02-01: 11.2 via ORAL

## 2011-02-08 ENCOUNTER — Other Ambulatory Visit: Payer: Self-pay | Admitting: Internal Medicine

## 2011-02-15 ENCOUNTER — Telehealth: Payer: Self-pay | Admitting: *Deleted

## 2011-02-15 NOTE — Telephone Encounter (Signed)
Pt left message stating that she had thyroid uptake scan a couple of weeks ago, and has yet to hear whether or not she has to do I-131 tx. Please advise.

## 2011-02-15 NOTE — Telephone Encounter (Signed)
Pt informed

## 2011-02-15 NOTE — Telephone Encounter (Signed)
Your thyroid did not collect enough iodine to do the iodine therapy Please return in January.

## 2011-02-18 ENCOUNTER — Other Ambulatory Visit: Payer: Self-pay | Admitting: *Deleted

## 2011-02-18 MED ORDER — BUPROPION HCL ER (XL) 150 MG PO TB24: 150.0000 mg | ORAL_TABLET | Freq: Every day | ORAL | Status: DC

## 2011-02-18 NOTE — Progress Notes (Signed)
Patient informed that RF was completed, left VM to check w/her pharm.

## 2011-02-23 ENCOUNTER — Other Ambulatory Visit: Payer: Self-pay | Admitting: Internal Medicine

## 2011-02-23 NOTE — Telephone Encounter (Signed)
Wellbutrin request [last refill 02/18/11 #90x1] to [facility] in [place].

## 2011-02-23 NOTE — Telephone Encounter (Signed)
?   Current for 90 days?

## 2011-02-24 ENCOUNTER — Other Ambulatory Visit: Payer: Self-pay | Admitting: Internal Medicine

## 2011-03-04 ENCOUNTER — Other Ambulatory Visit: Payer: Self-pay

## 2011-03-04 MED ORDER — CLONIDINE HCL 0.1 MG PO TABS: 0.1000 mg | ORAL_TABLET | Freq: Two times a day (BID) | ORAL | Status: DC

## 2011-03-10 ENCOUNTER — Other Ambulatory Visit: Payer: Self-pay | Admitting: Internal Medicine

## 2011-03-30 ENCOUNTER — Telehealth: Payer: Self-pay | Admitting: Internal Medicine

## 2011-03-30 DIAGNOSIS — G4733 Obstructive sleep apnea (adult) (pediatric): Secondary | ICD-10-CM

## 2011-03-30 NOTE — Telephone Encounter (Signed)
Pt says when she was here on 12/30/10 she signed a release for CDY to get her records and sleep study from [facility] in [place], [place]. She says she cannot wear her cpap mask because she cannot tighten her straps. She also wants to get a new machine because hers is over 66 years old. She used [place] when living in [place]. Dr. Maple Hudson, did you ever receive pt's old sleep study? Pls advise.

## 2011-03-31 NOTE — Telephone Encounter (Signed)
Spoke with pt and notified of recs per CDY. Pt verbalized understanding. Order sent to [facility] for CPAP through [facility].

## 2011-03-31 NOTE — Telephone Encounter (Signed)
Per CY-sorry for the mishap-please order replacement CPAP machine 10 cwp, humidifier, mask of choice.

## 2011-03-31 NOTE — Telephone Encounter (Signed)
For Katie's attention

## 2011-04-08 ENCOUNTER — Telehealth: Payer: Self-pay

## 2011-04-08 NOTE — Telephone Encounter (Signed)
Left message for pt to callback office.  

## 2011-04-08 NOTE — Telephone Encounter (Signed)
Are you finished with the prednisone, or are you still on it?

## 2011-04-08 NOTE — Telephone Encounter (Signed)
In addition to your usual insulin, take an extra 5 units of novolog whenever cbg is in the 200's, and 10 whenever it is over 300.

## 2011-04-08 NOTE — Telephone Encounter (Signed)
Pt called stating her CBG have been running high 300 - 220, higher in the mornings and before dinner. Pt also says that she started 40 mg of oral steroid on Dec 1st -4th. Pt is unsure if elevated is related to steriods but she is requesting and adjustment of her insulin, please advise.

## 2011-04-08 NOTE — Telephone Encounter (Signed)
Pt informed of MD's advisement. 

## 2011-04-08 NOTE — Telephone Encounter (Signed)
She finished course on 12/4

## 2011-04-11 ENCOUNTER — Telehealth: Payer: Self-pay

## 2011-04-11 ENCOUNTER — Encounter: Payer: Self-pay | Admitting: Family Medicine

## 2011-04-11 ENCOUNTER — Other Ambulatory Visit: Payer: Self-pay | Admitting: Internal Medicine

## 2011-04-11 ENCOUNTER — Ambulatory Visit (INDEPENDENT_AMBULATORY_CARE_PROVIDER_SITE_OTHER): Payer: Medicare Other | Admitting: Family Medicine

## 2011-04-11 DIAGNOSIS — L509 Urticaria, unspecified: Secondary | ICD-10-CM

## 2011-04-11 DIAGNOSIS — IMO0001 Reserved for inherently not codable concepts without codable children: Secondary | ICD-10-CM

## 2011-04-11 DIAGNOSIS — E0590 Thyrotoxicosis, unspecified without thyrotoxic crisis or storm: Secondary | ICD-10-CM

## 2011-04-11 DIAGNOSIS — L501 Idiopathic urticaria: Secondary | ICD-10-CM

## 2011-04-11 DIAGNOSIS — I10 Essential (primary) hypertension: Secondary | ICD-10-CM

## 2011-04-11 MED ORDER — INSULIN ASPART 100 UNIT/ML ~~LOC~~ SOLN: SUBCUTANEOUS | Status: DC

## 2011-04-11 MED ORDER — METHYLPREDNISOLONE (PAK) 4 MG PO TABS: ORAL_TABLET | ORAL | Status: AC

## 2011-04-11 MED ORDER — HYDROXYZINE HCL 50 MG PO TABS: 50.0000 mg | ORAL_TABLET | Freq: Every evening | ORAL | Status: AC | PRN

## 2011-04-11 NOTE — Telephone Encounter (Signed)
Pt called to inform MD that she was Rx'd prednisone at [facility]. Pt understands that she will need to increase insulin for sugars higher than 200 but she will need updated RX to cover increase. Please advise,

## 2011-04-11 NOTE — Assessment & Plan Note (Signed)
Patient encouraged to minimize all carbohydrates in the diet and to titrate up her Novolog if blood sugars spike up with medrol dose pak. She sees her Endocrinologist, Dr Everardo All next month

## 2011-04-11 NOTE — Assessment & Plan Note (Signed)
Mild elevation today secondary to patient very anxious and itchy, recheck at next visit.

## 2011-04-11 NOTE — Progress Notes (Signed)
Patient ID: Valerie Stevenson, female   DOB: October 31, 1945, 65 y.o.   MRN: [id number] Valerie Stevenson [id number] 19-May-1945 04/11/2011      Progress Note New Patient  Subjective  Chief Complaint  Chief Complaint  Patient presents with  . Rash    off and on since April    HPI  Is a 65 yo patient female who is in today very distressed about recurrent urticaria. She has been struggling with recurrent episodes since April of 2012. She believes it is related to radioactive iodine she was given but she also acknowledges she's been under a great deal of stress. She's had workup with 2 allergists and even an autoimmune workup all of which was largely unremarkable. They've changed multiple meds in the rash is continuing to occur. She is tearful and stays awake frequently due to the itching. She is taking Allegra twice a day as well as Zyrtec and Zantac twice a day at the suggestion of her allergists without any significant improvement. She did take a course of prednisone 40 mg daily for a few days and that definitely helped. Only to have it recur. She is a diabetic and she does note that her sugars worsened when she took the steroids. Spiking over 300 at times. She has been in contact with her endocrinologist and they recommended she increase her NovoLog which she did. Her sugar this morning was 170 as is slowly improving. She did have an anaphylactic reaction to something she thinks was related to seafood earlier this year he carries an EpiPen with her but has not needed it thus far. She is also complaining of chronic pain worse in her knees. She now she's recently retired and is at home frequently with her husband who is a struggling with a lot of stress as well. She has scheduled herself an appointment with dermatology but that is not a week or 2, palpitations, respiratory symptoms, GI or GU complaints at this time Past Medical History  Diagnosis Date  . Diabetes mellitus   . Toxic multinodular goiter   . OSA  (obstructive sleep apnea)     CPAP  . Glaucoma   . Renal disease   . Anemia     iron deficinecy  . Asthma   . COPD (chronic obstructive pulmonary disease)   . Depression   . GERD (gastroesophageal reflux disease)   . Hyperlipidemia   . Hypertension   . Hyperthyroidism     in context of nodule; Tapazole therapy since 2007  . Skin cancer   . Diverticulosis of colon   . Urticaria, idiopathic     Past Surgical History  Procedure Date  . Total abdominal hysterectomy w/ bilateral salpingoophorectomy   . Replacement total knee 2005    left     Family History  Problem Relation Age of Onset  . Coronary artery disease Father   . Stroke Mother   . Diabetes Mother   . Ovarian cancer Sister     twin sibling  . Alcohol abuse Maternal Grandfather   . Hyperthyroidism Sister     twin  . Goiter Sister     resected    History   Social History  . Marital Status: Married    Spouse Name: N/A    Number of Children: N/A  . Years of Education: N/A   Occupational History  . RN-Retired    Social History Main Topics  . Smoking status: Never Smoker   . Smokeless tobacco: Never Used  . Alcohol Use:  No  . Drug Use: No  . Sexually Active: Not on file   Other Topics Concern  . Not on file   Social History Narrative  . No narrative on file    Current Outpatient Prescriptions on File Prior to Visit  Medication Sig Dispense Refill  . amLODipine (NORVASC) 5 MG tablet Take 5 mg by mouth 2 (two) times daily.       . B-D UF III MINI PEN NEEDLES 31G X 5 MM MISC As directed      . buPROPion (WELLBUTRIN XL) 150 MG 24 hr tablet Take 1 tablet (150 mg total) by mouth daily.  90 tablet  1  . cetirizine (ZYRTEC) 10 MG tablet Take 10 mg by mouth. 1 by mouth in the pm       . cloNIDine (CATAPRES) 0.1 MG tablet Take 1 tablet (0.1 mg total) by mouth 2 (two) times daily.  60 tablet  5  . fexofenadine (ALLEGRA) 180 MG tablet Take 180 mg by mouth. 1 by mouth in the am       . Glucose Blood  (PRECISION XTRA TEST VI)        . insulin aspart (NOVOLOG FLEXPEN) 100 UNIT/ML injection Inject subcutaneously just before each meal 25-20-45 units  15 mL  5  . insulin glargine (LANTUS) 100 UNIT/ML injection Inject 50 Units into the skin at bedtime.  20 mL  11  . Insulin Syringe-Needle U-100 (RELION INSULIN SYR 0.5ML/31G) 31G X 5/16" 0.5 ML MISC by Does not apply route.        Marland Kitchen LORazepam (ATIVAN) 0.5 MG tablet as needed.        . montelukast (SINGULAIR) 10 MG tablet TAKE ONE TABLET BY MOUTH EVERY DAY  30 tablet  2  . pravastatin (PRAVACHOL) 40 MG tablet Take 40 mg by mouth daily.        . ranitidine (ZANTAC) 150 MG tablet Take 2 tablets (300 mg total) by mouth 2 (two) times daily.  60 tablet  4  . RELION ULTRA THIN PLUS LANCETS MISC As directed       . spironolactone (ALDACTONE) 50 MG tablet Take 1 tablet (50 mg total) by mouth daily.  30 tablet  6    Allergies  Allergen Reactions  . Latex     rash  . Penicillins     REACTION: Hives  . Shellfish-Derived Products     Anaphylactoid reaction   . Morphine And Related Itching    Review of Systems  Review of Systems  Constitutional: Negative for fever and malaise/fatigue.  HENT: Negative for congestion.   Eyes: Negative for pain and discharge.  Respiratory: Negative for shortness of breath.   Cardiovascular: Negative for chest pain, palpitations and leg swelling.  Gastrointestinal: Negative for nausea, abdominal pain and diarrhea.  Genitourinary: Negative for dysuria.  Musculoskeletal: Negative for falls.  Skin: Positive for itching and rash.       Intermittent, diffuse, and wide spread x 8 months  Neurological: Negative for loss of consciousness and headaches.  Endo/Heme/Allergies: Negative for polydipsia.  Psychiatric/Behavioral: Negative for depression and suicidal ideas. The patient is nervous/anxious and has insomnia.     Objective  BP 158/77  Pulse 78  Temp(Src) 97.4 F (36.3 C) (Oral)  Ht 5\' 6"  (1.676 m)  Wt 251 lb  1.9 oz (113.907 kg)  BMI 40.53 kg/m2  SpO2 96%  Physical Exam  Physical Exam  Constitutional: She is oriented to person, place, and time and well-developed, well-nourished, and in  no distress. No distress.  HENT:  Head: Normocephalic and atraumatic.  Eyes: Conjunctivae are normal.  Neck: Neck supple. No thyromegaly present.  Cardiovascular: Normal rate, regular rhythm and normal heart sounds.   No murmur heard. Pulmonary/Chest: Effort normal and breath sounds normal. She has no wheezes.  Abdominal: She exhibits no distension and no mass.  Musculoskeletal: She exhibits no edema.  Lymphadenopathy:    She has no cervical adenopathy.  Neurological: She is alert and oriented to person, place, and time.  Skin: Skin is warm and dry. Rash noted. She is not diaphoretic. There is erythema.       Diffuse scattered,  Urticarial patches on trunk and arms  Psychiatric: Memory, affect and judgment normal.       Assessment & Plan  HYPERTENSION Mild elevation today secondary to patient very anxious and itchy, recheck at next visit.  DIABETES MELLITUS, UNCONTROLLED Patient encouraged to minimize all carbohydrates in the diet and to titrate up her Novolog if blood sugars spike up with medrol dose pak. She sees her Endocrinologist, Dr Everardo All next month  Urticaria, idiopathic Patient has been having intermittent episodes of urticaria since April of 2012. She has responded slightly to prednisone 40 mg daily. But unfortunately after the steroids stop the urticaria worsens again should she see an allergist twice now without a definitive diagnosis. She has an appointment in the next couple days with dermatology as well but thus far has not seen them. She is given a pack of Medrol to use as directed with a refill. She had been directed by her allergist at [facility] to take Allegra twice a day and Zyrtec twice a day. She is having trouble sleeping due to aging. We will start hydroxyzine each bedtime  but she will need to forego her second dose of Allegra and even trying to get her second dose of Zyrtec in order to take the hydroxyzine. For worsening symptoms or any concerns. Patient's concern is that this occurred shortly after taking her radioactive iodine. She also believes she is allergic to shellfish. Finally she doesn't know she's been under a tremendous amount of stress lately and he noted that may be contributing  HYPERTHYROIDISM S/p radioactive iodine treatments

## 2011-04-11 NOTE — Telephone Encounter (Signed)
Rx sent to [facility], left message on pt's VM to inform pt rx has been sent.

## 2011-04-11 NOTE — Assessment & Plan Note (Signed)
Patient has been having intermittent episodes of urticaria since April of 2012. She has responded slightly to prednisone 40 mg daily. But unfortunately after the steroids stop the urticaria worsens again should she see an allergist twice now without a definitive diagnosis. She has an appointment in the next couple days with dermatology as well but thus far has not seen them. She is given a pack of Medrol to use as directed with a refill. She had been directed by her allergist at [facility] to take Allegra twice a day and Zyrtec twice a day. She is having trouble sleeping due to aging. We will start hydroxyzine each bedtime but she will need to forego her second dose of Allegra and even trying to get her second dose of Zyrtec in order to take the hydroxyzine. For worsening symptoms or any concerns. Patient's concern is that this occurred shortly after taking her radioactive iodine. She also believes she is allergic to shellfish. Finally she doesn't know she's been under a tremendous amount of stress lately and he noted that may be contributing

## 2011-04-11 NOTE — Assessment & Plan Note (Signed)
S/p radioactive iodine treatments

## 2011-04-11 NOTE — Patient Instructions (Addendum)
Hives Hives are itchy, red, puffy patches on the skin. Hives may change in size and shape. They may also show up in new places on the body. Hives may be a reaction to something that was eaten, touched, or put on the skin. Hives can also be a reaction to cold, heat, infections, medicine, insect bites, or stress. Hives cannot spread from one person to another. HOME CARE  Stay away from what caused the hives, if this applies.   To relieve itching and rash:   Apply cold packs to the skin. A cool bath may also help. Do not take hot baths or showers. Warm water makes the itching worse.   Take medicine to shrink the hives as told by your doctor. This medicine can make you sleepy. Do not drive if you take this medicine.   Only take medicine as told by your doctor.   Wear loose-fitting clothes and underwear.   Follow up with your doctor.  GET HELP RIGHT AWAY IF:   You have a fever.   Your lips or tongue puff up (swell).   Breathing or swallowing is hard to do.   Throat or chest tightness develops.   Belly (abdominal) pain develops.  These may be the first signs of a life-threatening allergic reaction. This is an emergency. Call your local emergency services (911 in [place] MAKE SURE YOU:   Understand these instructions.   Will watch your condition.   Will get help right away if you are not doing well or get worse.  Document Released: 01/19/2008 Document Revised: 12/22/2010 Document Reviewed: 03/17/2009 [facility] Patient Information 2012 [place], [place].  Witch Hazel Astringent and probiotic

## 2011-04-11 NOTE — Telephone Encounter (Signed)
5 extra units prn cbg in the 200's, and 10 prn over 300.  Please send refill if pt needs

## 2011-04-24 ENCOUNTER — Other Ambulatory Visit: Payer: Self-pay | Admitting: Internal Medicine

## 2011-05-20 ENCOUNTER — Other Ambulatory Visit: Payer: Self-pay | Admitting: Internal Medicine

## 2011-05-24 ENCOUNTER — Ambulatory Visit (INDEPENDENT_AMBULATORY_CARE_PROVIDER_SITE_OTHER): Payer: Medicare Other | Admitting: Endocrinology

## 2011-05-24 ENCOUNTER — Encounter: Payer: Self-pay | Admitting: Endocrinology

## 2011-05-24 DIAGNOSIS — E039 Hypothyroidism, unspecified: Secondary | ICD-10-CM

## 2011-05-24 DIAGNOSIS — E0590 Thyrotoxicosis, unspecified without thyrotoxic crisis or storm: Secondary | ICD-10-CM

## 2011-05-24 DIAGNOSIS — E119 Type 2 diabetes mellitus without complications: Secondary | ICD-10-CM

## 2011-05-24 NOTE — Patient Instructions (Addendum)
pending the test results, please continue novolog (just before each meal) 25-20-45, and the same lantus.   Please make a follow-up appointment in 4 months.   Let's hold-off any any more thyroid treatment for now. check your blood sugar 2 times a day.  vary the time of day when you check, between before the 3 meals, and at bedtime.  also check if you have symptoms of your blood sugar being too high or too low.  please keep a record of the readings and bring it to your next appointment here.  please call us sooner if your blood sugar goes below 70, or if it stays over 200.

## 2011-05-24 NOTE — Progress Notes (Signed)
Subjective:    Patient ID: Valerie Stevenson, female    DOB: [birth year], 66 y.o.   MRN: [id number]  HPI The state of at least three ongoing medical problems is addressed today:   Pt is 10 mos s/p i-131 rx for hyperthyroidism: pt states she feels well in general.  tsh was low a few mos ago, but she had it rechecked in the hospital [facility]) Multinodular goiter. she says she still notices it, especially on the right.  However, she says it is smaller now Dm (1983): she brings a record of her cbg's which i have reviewed today. cbg's continue to be affected by frequent steroid rx. She finished prednisone 4 days ago.  cbg's have improved from the 300's to the 100's.  There is no trend throughout the day.  Past Medical History  Diagnosis Date  . Diabetes mellitus   . Toxic multinodular goiter   . OSA (obstructive sleep apnea)     CPAP  . Glaucoma   . Renal disease   . Anemia     iron deficinecy  . Asthma   . COPD (chronic obstructive pulmonary disease)   . Depression   . GERD (gastroesophageal reflux disease)   . Hyperlipidemia   . Hypertension   . Hyperthyroidism     in context of nodule; Tapazole therapy since 2007  . Skin cancer   . Diverticulosis of colon   . Urticaria, idiopathic     Past Surgical History  Procedure Date  . Total abdominal hysterectomy w/ bilateral salpingoophorectomy   . Replacement total knee 2005    left     History   Social History  . Marital Status: Married    Spouse Name: N/A    Number of Children: N/A  . Years of Education: N/A   Occupational History  . RN-Retired    Social History Main Topics  . Smoking status: Never Smoker   . Smokeless tobacco: Never Used  . Alcohol Use: No  . Drug Use: No  . Sexually Active: Not on file   Other Topics Concern  . Not on file   Social History Narrative  . No narrative on file    Current Outpatient Prescriptions on File Prior to Visit  Medication Sig Dispense Refill  . amLODipine (NORVASC) 5 MG  tablet Take 5 mg by mouth 2 (two) times daily.       . B-D UF III MINI PEN NEEDLES 31G X 5 MM MISC As directed      . buPROPion (WELLBUTRIN XL) 150 MG 24 hr tablet Take 1 tablet (150 mg total) by mouth daily.  90 tablet  1  . cetirizine (ZYRTEC) 10 MG tablet Take 10 mg by mouth. 1 by mouth in the pm       . cloNIDine (CATAPRES) 0.1 MG tablet Take 1 tablet (0.1 mg total) by mouth 2 (two) times daily.  60 tablet  5  . fexofenadine (ALLEGRA) 180 MG tablet Take 180 mg by mouth. 1 by mouth in the am       . Glucose Blood (PRECISION XTRA TEST VI)       . insulin aspart (NOVOLOG FLEXPEN) 100 UNIT/ML injection Inject subcutaneously just before each meal 25-20-45 units and 5 extra units for BS 200 and above, 10 extra units for BS 300 and above  20 mL  5  . insulin glargine (LANTUS) 100 UNIT/ML injection Inject 50 Units into the skin at bedtime.  20 mL  11  . Insulin Syringe-Needle U-100 (  RELION INSULIN SYR 0.5ML/31G) 31G X 5/16" 0.5 ML MISC by Does not apply route.        Marland Kitchen LORazepam (ATIVAN) 0.5 MG tablet as needed.        . montelukast (SINGULAIR) 10 MG tablet TAKE ONE TABLET BY MOUTH EVERY DAY  30 tablet  2  . pravastatin (PRAVACHOL) 40 MG tablet Take 40 mg by mouth daily.        . ranitidine (ZANTAC) 150 MG tablet Take 2 tablets (300 mg total) by mouth 2 (two) times daily.  60 tablet  4  . ranitidine (ZANTAC) 150 MG tablet TAKE ONE TABLET BY MOUTH TWICE DAILY  60 tablet  1  . RELION ULTRA THIN PLUS LANCETS MISC As directed       . spironolactone (ALDACTONE) 50 MG tablet TAKE ONE TABLET BY MOUTH EVERY DAY  30 tablet  5    Allergies  Allergen Reactions  . Latex     rash  . Penicillins     REACTION: Hives  . Shellfish-Derived Products     Anaphylactoid reaction   . Morphine And Related Itching    Family History  Problem Relation Age of Onset  . Coronary artery disease Father   . Stroke Mother   . Diabetes Mother   . Ovarian cancer Sister     twin sibling  . Alcohol abuse Maternal  Grandfather   . Hyperthyroidism Sister     twin  . Goiter Sister     resected    BP 148/68  Pulse 78  Temp(Src) 97 F (36.1 C) (Oral)  Ht 5\' 6"  (1.676 m)  Wt 254 lb (115.214 kg)  BMI 41.00 kg/m2  SpO2 96%   Review of Systems She has lot a few lbs, due to her effort.  denies hypoglycemia.      Objective:   Physical Exam VITAL SIGNS:  See vs page GENERAL: no distress Neck: thyroid is still diffusely large, but no palpable nodules Pulses: dorsalis pedis intact bilat.   Feet: no deformity.  no ulcer on the feet.  feet are of normal color and temp.  no edema Neuro: sensation is intact to touch on the feet, but decreased from  normal  outside test results are reviewed: A1c=7.8 TSH=1.9    Assessment & Plan:  DM.  Considering her steroid rx, control is good. Hyperthyroidism,resolved with i-131 rx Multinodular goiter, apparently smaller.

## 2011-06-02 ENCOUNTER — Telehealth: Payer: Self-pay

## 2011-06-02 NOTE — Telephone Encounter (Signed)
Pt informed of MD's advisement regarding insulin adjustment.  

## 2011-06-02 NOTE — Telephone Encounter (Signed)
please increase novolog to (just before each meal) 30-25-50 units

## 2011-06-02 NOTE — Telephone Encounter (Signed)
Pt called stating she has been experiencing increase CBGs for the last few days and although she takes an additional 5 units of Novolog for sugars over 200 and 10 units for over 300. Pt's reading are generally in the low to mid 200's increasing as the day goes on (367 at noon and 357 at [employer]). Please advise, pt is requesting medication adjustment.

## 2011-06-07 ENCOUNTER — Telehealth: Payer: Self-pay

## 2011-06-07 MED ORDER — "INSULIN SYRINGE 31G X 5/16"" 1 ML MISC": Status: DC

## 2011-06-07 MED ORDER — INSULIN GLARGINE 100 UNIT/ML ~~LOC~~ SOLN: 60.0000 [IU] | Freq: Every day | SUBCUTANEOUS | Status: DC

## 2011-06-07 NOTE — Telephone Encounter (Signed)
Pt informed of MD's advisement and rx for increased insulin dosage and insulin syringes sent to pharmacy.

## 2011-06-07 NOTE — Telephone Encounter (Signed)
Increase lantus to 60-qhs

## 2011-06-07 NOTE — Telephone Encounter (Signed)
Pt called stating that her CBGs are elevated, above 200 but highest in the morning. Pt is concerned that insulin is not working for her and is requesting advisement from MD.

## 2011-06-21 ENCOUNTER — Ambulatory Visit (INDEPENDENT_AMBULATORY_CARE_PROVIDER_SITE_OTHER): Payer: Medicare Other | Admitting: Internal Medicine

## 2011-06-21 ENCOUNTER — Encounter: Payer: Self-pay | Admitting: Internal Medicine

## 2011-06-21 VITALS — BP 160/78 | HR 99 | Ht 66.0 in | Wt 263.4 lb

## 2011-06-21 DIAGNOSIS — L501 Idiopathic urticaria: Secondary | ICD-10-CM

## 2011-06-21 DIAGNOSIS — G4733 Obstructive sleep apnea (adult) (pediatric): Secondary | ICD-10-CM

## 2011-06-24 ENCOUNTER — Other Ambulatory Visit: Payer: Self-pay | Admitting: Internal Medicine

## 2011-06-25 ENCOUNTER — Encounter: Payer: Self-pay | Admitting: Internal Medicine

## 2011-06-25 NOTE — Assessment & Plan Note (Signed)
Good compliance and control with no changes needed. Weight loss is encouraged.

## 2011-06-25 NOTE — Assessment & Plan Note (Signed)
Now off of medications for one month with no recurrence. Had been evaluated at [facility] with no significant changes offered. Hopefully this has resolved. She will restart Allegra, Singulair, Zantac at any time if needed.

## 2011-06-25 NOTE — Progress Notes (Signed)
Patient ID: Valerie Stevenson, female    DOB: 1945/05/27, 66 y.o.   MRN: [id number]  HPI 09/02/10- 54 yoF w/ hx urticaria complicated by asthma/ COPD, food allergy to shellfish, RAI for thyroid Last here 08/17/10 for initial visit for Dr Linna Darner, with onset of hives 3 weeks previously. Discussed chronic lisinopril. Better with hives but still getting some especially as she gets up in the morning- shower makes them more visible. She has been taking Allegra 180, with Zantac 300mg  bid. She remains itchy. Had tried doxepin 10- no help and didn't like it. Dr Linna Darner gave Ativan.  Allergy profile-  IgE 296, but no specific elevations, WBC 16,600; No eos. No overt infection.  12/15/10- 35 yoF w/ hx urticaria complicated by asthma/ COPD, food allergy to shellfish, RAI for thyroid Continues to complain of itching with visible hives every day. Dr Loanne Drilling felt her thyroid hormone levels were not the problem, but the underlying cause of her thyroid disease may be related. She has tried zantac now at 150 twice daily, with Singulair. She came off allegra, which wasn't helping. Doxepin didn't help and not well tolerated. She has used Ativan to reduce irritability but it makes her too sleepy. I explained that urticaria may not be allergic, may be related to any of her medications, and may be related to her thyroid or diabetes disorders. There has not been evidence of malignancy. CT chest was negative except for thyroid nodule. Sedimentation rate was elevated at 57. ANA was negative. IgE was elevated at 266.1 but food profile was negative. Specific seafood profile showed mild elevations for crab and shrimp. IgG food profile results are being sought.  12/29/20-64 yoF w/ hx urticaria complicated by asthma/ COPD, food allergy to shellfish, RAI for thyroid Here today for new problem- concern of sleep disorder Documented hx OSA on CPAP. Needs to establish here for update Rx for machine and supplies. Her current CPAP at 10 is used  all night, every night. She had a sleep study in [place] to establish the diagnosis. Her current machine is 80 or 67 years old and needs to be replaced. She has not been using a humidifier. Nasal pillows mask. She is satisfied with her treatment and only asks that we help get her replacement machine and maintain followup through this office. Sleep hygiene: Bedtime between 9 and 10 PM, sleep latency 10-15 minutes, waking 2 or 3 times each night for finally up between 7:30 and 8 AM. Weight has gone up 25 pounds in the last 2 years. She feels restless at night and will get up to sit on the side of the bed. This is mostly do to her ongoing urticaria. No history of ENT surgery.  Problem of urticaria is ongoing. She is pending allergy evaluation at [facility]. I suspect relationship to her thyroid disease. Other medical problems include kidney disease elevated cholesterol diabetes palpitations and hypertension. There is no history of heart or lung disease otherwise. Lab- IgG food allergy panel showed elevations for milk and egg white. She understands this information is not firmly validated, and is of value only to raise awareness. She will try to avoid these for a month and see if that affects her urticaria. She does still believe that some food is the basis for her hives, although we have discussed other etiologies.   06/21/11- 60 yoF w/ hx urticaria complicated by asthma/ COPD, food allergy to shellfish, RAI for thyroid, OSA/ CPAP Likes her new CPAP machine which is smaller.  Wears it all night every night and can't sleep without it. Using nasal pillows mask/Advanced/10 cwp. Has not had urticaria in the past month, off meds.. I had sent her to [facility] where evaluation there put her on similar medication-Allegra, Singulair, Zantac. He told her basically the same things that I have told her.  Review of Systems Constitutional:   No weight loss, night sweats,  Fevers, chills, fatigue,  lassitude. HEENT:   No headaches,  Difficulty swallowing,  Tooth/dental problems,  Sore throat,                No sneezing,, ear ache, nasal congestion, post nasal drip,  CV:  No chest pain,  Orthopnea, PND, swelling in lower extremities, anasarca, dizziness, palpitations GI  No heartburn, indigestion, abdominal pain, nausea, vomiting, diarrhea, change in bowel habits, loss of appetite Resp: No shortness of breath with exertion or at rest.  No excess mucus, no productive cough,  No non-productive cough,  No coughing up of blood.  No change in color of mucus.  No wheezing.  Skin:HPI and prior visits. GU: . MS:  No joint pain or swelling.  No decreased range of motion.  No back pain. Psych:  No change in mood or affect. No depression or anxiety.  No memory loss.      Objective:   Physical Exam General- Alert, Oriented, Affect-appropriate, Distress- none acute  Obese    Skin- rash-none, lesions- none, Lymphadenopathy- none Head- atraumatic            Eyes- Gross vision intact, PERRLA, conjunctivae clear secretions            Ears- Hearing, canals normal            Nose- Clear, no-Septal dev, mucus, polyps, erosion, perforation             Throat- Mallampati III , mucosa clear , drainage- none, tonsils- atrophic Neck- flexible , trachea midline, no stridor , thyroid nl, carotid no bruit Chest - symmetrical excursion , unlabored           Heart/CV- RRR , no murmur , no gallop  , no rub, nl s1 s2                           - JVD- none , edema- none, stasis changes- none, varices- none           Lung- clear to P&A, wheeze- none, cough- none , dullness-none, rub- none           Chest wall-  Abd- Br/ Gen/ Rectal- Not done, not indicated Extrem- cyanosis- none, clubbing, none, atrophy- none, strength- nl Neuro- grossly intact to observation except chronic droop right corner of mouth

## 2011-06-25 NOTE — Patient Instructions (Signed)
Continue CPAP 10 CWP please call as needed  Restart triple therapy for hives with Allegra, Singulair, Zantac at anytime if needed.

## 2011-07-03 ENCOUNTER — Other Ambulatory Visit: Payer: Self-pay | Admitting: Internal Medicine

## 2011-07-04 NOTE — Telephone Encounter (Signed)
Prescription sent to pharmacy.

## 2011-07-05 ENCOUNTER — Telehealth: Payer: Self-pay

## 2011-07-05 NOTE — Telephone Encounter (Signed)
Pt called to ask MD if she would be safe to take a supplement with DM medications - Karin Lieu. Medication has a warning included that states supplement can lower blood sugars

## 2011-07-05 NOTE — Telephone Encounter (Signed)
Pt informed of MD's advisement. 

## 2011-07-05 NOTE — Telephone Encounter (Signed)
Ok with me 

## 2011-08-15 ENCOUNTER — Telehealth: Payer: Self-pay

## 2011-08-15 NOTE — Telephone Encounter (Signed)
Pt called c/o of several hypoglycemic episode at night - low 60s. Pt is requesting adjustment on her insulin, please advise.

## 2011-08-15 NOTE — Telephone Encounter (Signed)
Reduce lantus to 50-qhs

## 2011-08-15 NOTE — Telephone Encounter (Signed)
Pt informed of MD's advisement regarding insulin dosage.  

## 2011-08-18 ENCOUNTER — Telehealth: Payer: Self-pay

## 2011-08-18 NOTE — Telephone Encounter (Signed)
Reduce supper novolog to 35

## 2011-08-18 NOTE — Telephone Encounter (Signed)
Left message for pt to callback office.  

## 2011-08-18 NOTE — Telephone Encounter (Signed)
Pt called stating she was awoken last night at midnight sweating and shaking. She checked her BCG and it was 69.  After eating and drink juice she was able to go back to sleep. Pt also states that her CBG was 142 this morning before breakfast. Pt is requesting medication adjustment from MD,

## 2011-08-19 NOTE — Telephone Encounter (Signed)
Pt informed

## 2011-08-26 ENCOUNTER — Telehealth: Payer: Self-pay

## 2011-08-26 NOTE — Telephone Encounter (Signed)
Reduce lantus to 30 units qhs  

## 2011-08-26 NOTE — Telephone Encounter (Signed)
Pt called stating that she is still experiencing low blood sugars throughout the day - low 60 to high 80 at varying times. Pt is requesting MD's advisement, she she be concerned about CBGs?

## 2011-08-26 NOTE — Telephone Encounter (Signed)
Pt informed of MD's advisement regarding insulin adjustment.  

## 2011-08-30 ENCOUNTER — Other Ambulatory Visit: Payer: Self-pay | Admitting: Internal Medicine

## 2011-08-30 NOTE — Telephone Encounter (Signed)
Patient needs to schedule a CPX for Near future (June or shortly after)

## 2011-09-05 ENCOUNTER — Other Ambulatory Visit: Payer: Self-pay | Admitting: Internal Medicine

## 2011-09-06 ENCOUNTER — Other Ambulatory Visit: Payer: Self-pay

## 2011-09-06 MED ORDER — CLONIDINE HCL 0.1 MG PO TABS: 0.1000 mg | ORAL_TABLET | Freq: Two times a day (BID) | ORAL | Status: DC

## 2011-09-06 MED ORDER — BUPROPION HCL ER (XL) 150 MG PO TB24: 150.0000 mg | ORAL_TABLET | Freq: Every day | ORAL | Status: DC

## 2011-09-13 ENCOUNTER — Other Ambulatory Visit: Payer: Self-pay | Admitting: Endocrinology

## 2011-09-21 ENCOUNTER — Encounter: Payer: Self-pay | Admitting: Endocrinology

## 2011-09-21 ENCOUNTER — Other Ambulatory Visit (INDEPENDENT_AMBULATORY_CARE_PROVIDER_SITE_OTHER): Payer: Medicare Other

## 2011-09-21 ENCOUNTER — Telehealth: Payer: Self-pay | Admitting: *Deleted

## 2011-09-21 ENCOUNTER — Ambulatory Visit (INDEPENDENT_AMBULATORY_CARE_PROVIDER_SITE_OTHER): Payer: Medicare Other | Admitting: Endocrinology

## 2011-09-21 VITALS — BP 148/60 | HR 90 | Temp 98.1°F | Ht 66.0 in | Wt 266.0 lb

## 2011-09-21 DIAGNOSIS — N058 Unspecified nephritic syndrome with other morphologic changes: Secondary | ICD-10-CM

## 2011-09-21 DIAGNOSIS — E1129 Type 2 diabetes mellitus with other diabetic kidney complication: Secondary | ICD-10-CM | POA: Insufficient documentation

## 2011-09-21 DIAGNOSIS — E0590 Thyrotoxicosis, unspecified without thyrotoxic crisis or storm: Secondary | ICD-10-CM

## 2011-09-21 DIAGNOSIS — E1065 Type 1 diabetes mellitus with hyperglycemia: Secondary | ICD-10-CM

## 2011-09-21 DIAGNOSIS — E1029 Type 1 diabetes mellitus with other diabetic kidney complication: Secondary | ICD-10-CM

## 2011-09-21 DIAGNOSIS — H3500 Unspecified background retinopathy: Secondary | ICD-10-CM

## 2011-09-21 DIAGNOSIS — IMO0002 Reserved for concepts with insufficient information to code with codable children: Secondary | ICD-10-CM | POA: Insufficient documentation

## 2011-09-21 LAB — HEMOGLOBIN A1C: Hgb A1c MFr Bld: 7.3 % — ABNORMAL HIGH (ref 4.6–6.5)

## 2011-09-21 NOTE — Patient Instructions (Addendum)
blood tests are being requested for you today.  You will receive a letter with results. pending the test results, please continue lantus 30 units at bedtime, and reduce novolog to (just before each meal) 30-20-35, and the same lantus.   Please make a follow-up appointment in 3 months.   check your blood sugar 2 times a day.  vary the time of day when you check, between before the 3 meals, and at bedtime.  also check if you have symptoms of your blood sugar being too high or too low.  please keep a record of the readings and bring it to your next appointment here.  please call us sooner if your blood sugar goes below 70, or if it stays over 200.

## 2011-09-21 NOTE — Progress Notes (Signed)
Subjective:    Patient ID: Valerie Stevenson, female    DOB: 06/01/1945, 65 y.o.   MRN: [id number]  HPI The state of at least three ongoing medical problems is addressed today:   Pt is 14 mos s/p i-131 rx for hyperthyroidism: pt states she feels well in general, except for fatigue.  Multinodular goiter. she says she no longer notices it. Dm (dx'ed 1983; complicated by renal insuff and retinopathy): she brings a record of her cbg's which i have reviewed today. It varies from 60's (afternoon) to 200 (other times).  However, mot are in the 100's.  She has not recently been on prednisone.  There is no trend throughout the day.  Past Medical History  Diagnosis Date  . Diabetes mellitus   . Toxic multinodular goiter   . OSA (obstructive sleep apnea)     CPAP  . Glaucoma   . Renal disease   . Anemia     iron deficinecy  . Asthma   . COPD (chronic obstructive pulmonary disease)   . Depression   . GERD (gastroesophageal reflux disease)   . Hyperlipidemia   . Hypertension   . Hyperthyroidism     in context of nodule; Tapazole therapy since 2007  . Skin cancer   . Diverticulosis of colon   . Urticaria, idiopathic     Past Surgical History  Procedure Date  . Total abdominal hysterectomy w/ bilateral salpingoophorectomy   . Replacement total knee 2005    left     History   Social History  . Marital Status: Married    Spouse Name: N/A    Number of Children: N/A  . Years of Education: N/A   Occupational History  . RN-Retired    Social History Main Topics  . Smoking status: Never Smoker   . Smokeless tobacco: Never Used  . Alcohol Use: No  . Drug Use: No  . Sexually Active: Not on file   Other Topics Concern  . Not on file   Social History Narrative  . No narrative on file    Current Outpatient Prescriptions on File Prior to Visit  Medication Sig Dispense Refill  . amLODipine (NORVASC) 5 MG tablet TAKE ONE TABLET BY MOUTH TWICE DAILY  60 tablet  1  . B-D UF III MINI  PEN NEEDLES 31G X 5 MM MISC As directed      . buPROPion (WELLBUTRIN XL) 150 MG 24 hr tablet Take 1 tablet (150 mg total) by mouth daily.  90 tablet  1  . cloNIDine (CATAPRES) 0.1 MG tablet Take 1 tablet (0.1 mg total) by mouth 2 (two) times daily.  60 tablet  3  . fexofenadine (ALLEGRA) 180 MG tablet Take 180 mg by mouth. 1 by mouth in the am       . Glucose Blood (PRECISION XTRA TEST VI)       . insulin aspart (NOVOLOG) 100 UNIT/ML injection Inject subcutaneously just before each meal 30-20-35 units  1 vial  0  . insulin glargine (LANTUS) 100 UNIT/ML injection Inject 30 Units into the skin at bedtime.  20 mL  0  . Insulin Syringe-Needle U-100 (INSULIN SYRINGE 1CC/31GX5/16") 31G X 5/16" 1 ML MISC Use as directed once daily  100 each  3  . LORazepam (ATIVAN) 0.5 MG tablet as needed.        . pravastatin (PRAVACHOL) 40 MG tablet Take 40 mg by mouth daily.        . ranitidine (ZANTAC) 150 MG tablet  TAKE ONE TABLET BY MOUTH TWICE DAILY  60 tablet  3  . RELION PEN NEEDLE 31G/8MM 31G X 8 MM MISC THREE TIMES DAILY  100 each  10  . RELION ULTRA THIN PLUS LANCETS MISC As directed       . spironolactone (ALDACTONE) 50 MG tablet TAKE ONE TABLET BY MOUTH EVERY DAY  30 tablet  5  . montelukast (SINGULAIR) 10 MG tablet TAKE ONE TABLET BY MOUTH EVERY DAY  30 tablet  11  . DISCONTD: amLODipine (NORVASC) 5 MG tablet Take 5 mg by mouth 2 (two) times daily.       Marland Kitchen DISCONTD: cloNIDine (CATAPRES) 0.1 MG tablet Take 1 tablet (0.1 mg total) by mouth 2 (two) times daily.  60 tablet  5  . DISCONTD: pravastatin (PRAVACHOL) 40 MG tablet Take 1 tablet (40 mg total) by mouth daily.  90 tablet  1    Allergies  Allergen Reactions  . Latex     rash  . Penicillins     REACTION: Hives  . Shellfish-Derived Products     Anaphylactoid reaction   . Morphine And Related Itching    Family History  Problem Relation Age of Onset  . Coronary artery disease Father   . Stroke Mother   . Diabetes Mother   . Ovarian cancer  Sister     twin sibling  . Alcohol abuse Maternal Grandfather   . Hyperthyroidism Sister     twin  . Goiter Sister     resected    BP 148/60  Pulse 90  Temp(Src) 98.1 F (36.7 C) (Oral)  Ht 5\' 6"  (1.676 m)  Wt 266 lb (120.657 kg)  BMI 42.93 kg/m2  SpO2 97%    Review of Systems Denies loc.  She has weight gain.    Objective:   Physical Exam VITAL SIGNS:  See vs page GENERAL: no distress.  Morbid obesity. Neck: Thyroid is diffusely enlarged.  i can't tell details  Lab Results  Component Value Date   TSH 1.38 09/21/2011   Lab Results  Component Value Date   HGBA1C 7.3* 09/21/2011      Assessment & Plan:  DM.  In order to improve a1c, we need to first reduce hypoglycemia Hyperthyroidism, resolved with i-131 rx Multinodular goiter, smaller after i-131 rx

## 2011-09-21 NOTE — Telephone Encounter (Signed)
Pt informed of lab results. 

## 2011-09-21 NOTE — Telephone Encounter (Signed)
Called pt to inform of lab results, left message for pt to callback office (letter also mailed to pt). 

## 2011-09-23 ENCOUNTER — Inpatient Hospital Stay (HOSPITAL_COMMUNITY): Admission: RE | Admit: 2011-09-23 | Discharge: 2011-09-23 | Payer: Medicare Other | Source: Ambulatory Visit

## 2011-09-23 ENCOUNTER — Encounter (HOSPITAL_COMMUNITY): Payer: Self-pay

## 2011-09-23 HISTORY — DX: Unspecified osteoarthritis, unspecified site: M19.90

## 2011-09-23 HISTORY — DX: Dermatitis, unspecified: L30.9

## 2011-09-23 NOTE — Progress Notes (Addendum)
Friday 09/22/2011  10:00     No orders, but when i spoke with Sherri at Dr. Greig Right office, patient is due in to see Dr Eulah Pont on the 18th of June.Valerie KitchenMarland KitchenOrders should be available after that........DA  1100   Pt had visited [facility] in [place] for "chest pains'....was worked up with ekg & stress tests-- Which she said came out 'normal and that it was most likely stress related'.....will call for that report  DA  1140  Located sleep study results from [facility]---in with chart   DA

## 2011-09-23 NOTE — Pre-Procedure Instructions (Addendum)
20 Valerie Stevenson   09/23/2011   Your procedure is scheduled on:  June 26th, Wednesday  Report to [facility] at  8:55 AM.   Call this number if you have problems the morning of surgery: [phone]   Remember:   Do not eat food:After Midnight Tuesday.  May have clear liquids:until Midnight     Take these medicines the morning of surgery with A SIP OF WATER: Norvasc, Wellbutrin              Ativan, Zantac   Do not wear jewelry, make-up or nail polish.  Do not wear lotions, powders, or perfumes. You may wear deodorant.  Do not shave 48 hours prior to surgery. Men may shave face and neck.  Do not bring valuables to the hospital.    Contacts, dentures or bridgework may not be worn into surgery.  Leave suitcase in the car. After surgery it may be brought to your room.  For patients admitted to the hospital, checkout time is 11:00 AM the day of discharge.   Patients discharged the day of surgery will not be allowed to drive home.  Name and phone number of your driver:   Special Instructions: CHG Shower Use Special Wash: 1/2 bottle night before surgery and 1/2 bottle morning of surgery.   Please read over the following fact sheets that you were given: Pain Booklet, Coughing and Deep Breathing, MRSA Information and Surgical Site Infection Prevention

## 2011-09-27 ENCOUNTER — Other Ambulatory Visit: Payer: Self-pay | Admitting: Internal Medicine

## 2011-09-27 ENCOUNTER — Telehealth: Payer: Self-pay

## 2011-09-27 NOTE — Telephone Encounter (Signed)
Pt informed of MD's advisement regarding insulin dosages.

## 2011-09-27 NOTE — Telephone Encounter (Signed)
Increase lantus to 35 units qhs

## 2011-09-27 NOTE — Telephone Encounter (Signed)
Pt called to inform MD that her blood sugars are still elevated - 200+ before breakfast but 200-130 throughout the day. Pt is requesting advisement.

## 2011-09-30 ENCOUNTER — Telehealth: Payer: Self-pay

## 2011-09-30 NOTE — Telephone Encounter (Signed)
Pt called c/o elevated blood sugars, 200+ throughout the day, 265 this morning before breakfast. I called pt to advise SAE out of office and pt stated that she is going to adjust her insulin, monitor her CBGs and call back Monday when SAE returns to clinic. Pt declined my offer to send note to PCP Dr Alwyn Ren.

## 2011-10-03 ENCOUNTER — Encounter (HOSPITAL_COMMUNITY): Payer: Self-pay

## 2011-10-03 NOTE — Telephone Encounter (Signed)
Please increase lantus to 40 units qhs

## 2011-10-03 NOTE — Telephone Encounter (Signed)
Pt informed of MD's advisement regarding insulin dosage change.  

## 2011-10-04 ENCOUNTER — Telehealth: Payer: Self-pay | Admitting: Endocrinology

## 2011-10-04 NOTE — Progress Notes (Signed)
Patient ID: Valerie Stevenson, female   DOB: 06-10-1945, 66 y.o.   MRN: [id number] The patient is scheduled for a right total knee replacement on 10/17/11 Dr. Eulah Pont at [facility]. Records have been requested. The 01/06/11 office visit represents my last contact with the patient.  She's been followed by Dr. Maple Hudson for sleep apnea and Dr. Everardo All for type 1 diabetes.  Surgical clearance should be sought from these physicians who have seen her more  recently than September 2012. Based on her history she would be a high operative risk candidate because of her multiple comorbidities.

## 2011-10-04 NOTE — Telephone Encounter (Signed)
Caller: Valerie Stevenson/Patient; PCP: Romero Belling; CB#: [phone]; ; ; Call regarding BS Will Not Come Down; Afebrile. Pt calling to let PCP know that the last 2-3 days blood sugar has been over around 230 or 240 in the morning. Today it is 240. States she does not have sliding scale insulin. Emergent s/s of Diabetes Control problems  protocol r/o. Pt to see provider within 24hrs. Pt wants message sent to provider to adjust dosage of insulin.

## 2011-10-04 NOTE — Telephone Encounter (Signed)
increase novolog to (just before each meal) 35-20-40 units.

## 2011-10-04 NOTE — Telephone Encounter (Signed)
Pt informed of MD's advisement regarding insulin adjustment.  

## 2011-10-05 ENCOUNTER — Telehealth: Payer: Self-pay | Admitting: *Deleted

## 2011-10-05 NOTE — Telephone Encounter (Signed)
Natalie from Dr. Greig Right office called in reference to pt who is having total knee replacement on June 26. They have faxed papers over twice for surgical clearance & she is checking the status on the papers. Please advise.

## 2011-10-05 NOTE — Telephone Encounter (Signed)
I have not seen her since September 2012. She would need to obtain a medical clearance from Dr. Jetty Duhamel because of  history of sleep apnea and from Dr. Everardo All because of history of poorly controlled diabetes.  Their evaluations can serveas medical clearance

## 2011-10-05 NOTE — Telephone Encounter (Signed)
Spoke with Dorene Grebe & she states that she is going to call Dr. Maple Hudson & Dr. Everardo All but that the pt may need to still have clearance from Dr. Alwyn Ren as well. If this is the case she is going to call the pt to have her schedule an OV asap.

## 2011-10-07 ENCOUNTER — Telehealth: Payer: Self-pay | Admitting: Internal Medicine

## 2011-10-07 NOTE — Telephone Encounter (Signed)
I spoke with Sherri and pt is needing surgical clearance for R total knee replacement on 10/19/11 by Dr. Billee Cashing. Please advise Dr. Maple Hudson thanks

## 2011-10-10 NOTE — Telephone Encounter (Signed)
Spoke with Sherri and notified of recs per CDY. She verbalized understanding. States that she is faxing form to triage for CDY to sign. I already faxed ov note. Will await form.

## 2011-10-10 NOTE — Telephone Encounter (Signed)
LMTCB Sherri at lunch

## 2011-10-10 NOTE — Telephone Encounter (Signed)
Done

## 2011-10-10 NOTE — Telephone Encounter (Signed)
She is clear for surgery from sleep apnea standpoint, which is what I have followed. She should wear CPAP 10 in recovery and while sleeping. Please send this info and a copy of my last ov note to Dr Murphy/ Orthopedics.

## 2011-10-10 NOTE — Telephone Encounter (Signed)
Form received and placed on CDY's cart to be completed.

## 2011-10-11 ENCOUNTER — Telehealth: Payer: Self-pay | Admitting: Internal Medicine

## 2011-10-11 ENCOUNTER — Encounter: Payer: Self-pay | Admitting: Internal Medicine

## 2011-10-11 ENCOUNTER — Ambulatory Visit (INDEPENDENT_AMBULATORY_CARE_PROVIDER_SITE_OTHER): Payer: Medicare Other | Admitting: Internal Medicine

## 2011-10-11 VITALS — BP 148/72 | HR 85 | Temp 98.2°F | Resp 12 | Ht 65.5 in | Wt 268.0 lb

## 2011-10-11 DIAGNOSIS — Z0000 Encounter for general adult medical examination without abnormal findings: Secondary | ICD-10-CM

## 2011-10-11 DIAGNOSIS — J45909 Unspecified asthma, uncomplicated: Secondary | ICD-10-CM

## 2011-10-11 DIAGNOSIS — D509 Iron deficiency anemia, unspecified: Secondary | ICD-10-CM

## 2011-10-11 DIAGNOSIS — G4733 Obstructive sleep apnea (adult) (pediatric): Secondary | ICD-10-CM

## 2011-10-11 DIAGNOSIS — M1711 Unilateral primary osteoarthritis, right knee: Secondary | ICD-10-CM

## 2011-10-11 DIAGNOSIS — M1710 Unilateral primary osteoarthritis, unspecified knee: Secondary | ICD-10-CM

## 2011-10-11 DIAGNOSIS — E785 Hyperlipidemia, unspecified: Secondary | ICD-10-CM

## 2011-10-11 DIAGNOSIS — IMO0001 Reserved for inherently not codable concepts without codable children: Secondary | ICD-10-CM

## 2011-10-11 DIAGNOSIS — IMO0002 Reserved for concepts with insufficient information to code with codable children: Secondary | ICD-10-CM

## 2011-10-11 DIAGNOSIS — Z01818 Encounter for other preprocedural examination: Secondary | ICD-10-CM

## 2011-10-11 DIAGNOSIS — N259 Disorder resulting from impaired renal tubular function, unspecified: Secondary | ICD-10-CM

## 2011-10-11 DIAGNOSIS — I10 Essential (primary) hypertension: Secondary | ICD-10-CM

## 2011-10-11 DIAGNOSIS — E0590 Thyrotoxicosis, unspecified without thyrotoxic crisis or storm: Secondary | ICD-10-CM

## 2011-10-11 LAB — BASIC METABOLIC PANEL
CO2: 25 mEq/L (ref 19–32)
Calcium: 9.5 mg/dL (ref 8.4–10.5)
Creatinine, Ser: 1.5 mg/dL — ABNORMAL HIGH (ref 0.4–1.2)
GFR: 37.15 mL/min — ABNORMAL LOW (ref >60.00)
Sodium: 140 mEq/L (ref 135–145)

## 2011-10-11 LAB — CBC WITH DIFFERENTIAL/PLATELET
Basophils Absolute: 0.1 10*3/uL (ref 0.0–0.1)
Basophils Relative: 0.5 % (ref 0.0–3.0)
Eosinophils Absolute: 0.5 10*3/uL (ref 0.0–0.7)
Hemoglobin: 14.3 g/dL (ref 12.0–15.0)
Lymphocytes Relative: 18.4 % (ref 12.0–46.0)
MCHC: 32.8 g/dL (ref 30.0–36.0)
Monocytes Relative: 7.3 % (ref 3.0–12.0)
Neutro Abs: 8 10*3/uL — ABNORMAL HIGH (ref 1.4–7.7)
Neutrophils Relative %: 69.3 % (ref 43.0–77.0)
RBC: 4.69 Mil/uL (ref 3.87–5.11)
WBC: 11.6 10*3/uL — ABNORMAL HIGH (ref 4.5–10.5)

## 2011-10-11 LAB — HEPATIC FUNCTION PANEL
AST: 19 U/L (ref 0–37)
Alkaline Phosphatase: 94 U/L (ref 39–117)
Bilirubin, Direct: 0.1 mg/dL (ref 0.0–0.3)
Total Protein: 7.4 g/dL (ref 6.0–8.3)

## 2011-10-11 LAB — LIPID PANEL
Total CHOL/HDL Ratio: 4
VLDL: 43.6 mg/dL — ABNORMAL HIGH (ref 0.0–40.0)

## 2011-10-11 NOTE — Telephone Encounter (Signed)
Patient was here this morning, when she left her BS was 280, she called Dr.Ellison ofc., he is out of town this week & they told her to call hopper to see if he could take care of. Please call patient regarding elevated Blood sugar at [phone] Patient has Appt at 10am so she will be unavailable at this time

## 2011-10-11 NOTE — Assessment & Plan Note (Signed)
No lipids in the chart; fasting lipids drawn today. Last liver enzymes were normal  12/15/10

## 2011-10-11 NOTE — Telephone Encounter (Signed)
And 2 units of the short acting insulin before each meal to the present regimen Dr. Everardo All has prescribed

## 2011-10-11 NOTE — Progress Notes (Signed)
Subjective:    Patient ID: Valerie Stevenson, female    DOB: [birth year], 66 y.o.   MRN: [id number]  HPI   She has end-stage degenerative joint disease of the right knee associated with constant pain and impaired mobilization. Nonsteroidals as needed and a steroid interarticular injection 07/29/11 have not control symptoms. Additionally she's concerned about the impact of nonsteroidals on renal impairment. Total knee replacement has been recommended by Dr. Eulah Pont. She had a left total knee performed in 2003. Past medical history/family history/social history were all reviewed and updated. Pertinent data & co-morbidities are detailed below:     Review of Systems HYPERTENSION: Disease Monitoring: Blood pressure range-not monitored @ home; 140s/60-70s @ MD appts Chest pain, palpitations- no chest pain since evaluation in 04/2011 @ [facility]. She was monitored overnight; cardiac enzymes were negative. Stress test was also negative. Those records are not found in the EMR.      Dyspnea- no. Her reactive airways disease is inactive at this time; no rescue inhaler use. Her sleep apnea is adequately controlled with CPAP prescribed by Dr. Maple Hudson. Medications: Compliance- yes  Lightheadedness,Syncope- no  Edema- intermittent , especially @ beach  DIABETES: Disease Monitoring: Blood Sugar ranges-FBS 123- 200s; Dr Everardo All has increased insulin by phone. A1c was 7.3% on 09/21/11  Polyuria/phagia/dipsia-no       Visual problems- no Medications: Compliance- yes  Hypoglycemic symptoms- no  HYPERLIPIDEMIA: Disease Monitoring: See symptoms for Hypertension Medications: Compliance- no  Abd pain, bowel changes- no   Muscle aches- no         Objective:   Physical Exam Gen.: Morbid obesity. Alert, appropriate and cooperative throughout exam. Head: Normocephalic without obvious abnormalities  Eyes: No corneal or conjunctival inflammation noted. Extraocular motion intact. Vision grossly  normal. Ears: External  ear exam reveals no significant lesions or deformities. Canals clear .TMs normal. Hearing is grossly normal bilaterally. Nose: External nasal exam reveals no deformity or inflammation. Nasal mucosa are pink and moist. No lesions or exudates noted.   Mouth: Oral mucosa and oropharynx reveal no lesions or exudates. Teeth in good repair. Neck: No deformities, masses, or tenderness noted. Range of motion essentially normal. Thyroid : Physiologic asymmetry; right lobe greater than the left. No definite palpable nodules. Lungs: Normal respiratory effort; chest expands symmetrically. Lungs are clear to auscultation without rales, wheezes, or increased work of breathing. Heart: Normal rate and rhythm. Normal S1 and S2. No gallop, click, or rub. Grade 1/6 systolic murmur . Abdomen: Bowel sounds normal; abdomen protuberant but soft and nontender. No masses, organomegaly or hernias noted.                                                    Musculoskeletal/extremities: lordosis noted of  the thoracic  spine. No clubbing, cyanosis, edema, or deformity noted. Range of motion  normal .Tone & strength  normal.Nail health  good. Fusiform enlargement of the right knee with marked crepitus and decreased range of motion Vascular: Carotid, radial artery, dorsalis pedis and  posterior tibial pulses are full and equal. No bruits present. Neurologic: Alert and oriented x3. Deep tendon reflexes symmetrical and normal.         Skin: Intact without suspicious lesions or rashes. Lymph: No cervical, axillary lymphadenopathy present. Psych: Mood and affect are normal. Normally interactive  Assessment & Plan:  #1 end-stage degenerative joint disease  #2 multiple comorbidities as discussed in the problem list.  Plan: See orders. Perioperative management of her diabetes will be necessary. This should be discussed  with Dr. Everardo All. Pulmonary critical care may need to be involved if there any issues with her sleep apnea.

## 2011-10-11 NOTE — Assessment & Plan Note (Signed)
TSH was therapeutic at 1.38 on 09/21/11

## 2011-10-11 NOTE — Telephone Encounter (Signed)
I called patient, patient verbalized understanding of Dr.Hopper's results and indicated she will f/u with Dr.Elliaon next week

## 2011-10-11 NOTE — Assessment & Plan Note (Signed)
No recent CBC and differential in chart; CBC and differential drawn

## 2011-10-11 NOTE — Telephone Encounter (Signed)
Spoke with [employer]. She states that the fax was received this am, nothing further needed.

## 2011-10-11 NOTE — Telephone Encounter (Signed)
Katie, did you fax this back for CDY?

## 2011-10-11 NOTE — Assessment & Plan Note (Addendum)
Blood pressure control appears to be adequate;BMET drawn.  EKG reveals low voltage in the precordial leads related to breast tissue and body habitus. There are no hypertensive or ischemic changes present. Minor nonspecific ST-T changes are suggestive.

## 2011-10-11 NOTE — Assessment & Plan Note (Signed)
Reactive airways disease is well controlled. She does require use of  rescue inhalers

## 2011-10-11 NOTE — Patient Instructions (Addendum)
.  Share results with Dr Greig Right staff. Surgical clearance depends on results of tests drawn this morning. The most concerned about diabetes and sleep apnea which will need to be  monitored perioperatively.  Please try to go on My Chart within the next 24 hours to allow me to release the results directly to you.

## 2011-10-11 NOTE — Telephone Encounter (Signed)
This has been taken care and faxed.

## 2011-10-11 NOTE — Assessment & Plan Note (Signed)
She states her sleep apnea is well controlled with CPAP ; she is monitored by Dr. Jetty Duhamel

## 2011-10-11 NOTE — Assessment & Plan Note (Signed)
No recent renal function studies in EMR. BMET will be performed

## 2011-10-12 ENCOUNTER — Encounter: Payer: Self-pay | Admitting: Internal Medicine

## 2011-10-12 ENCOUNTER — Other Ambulatory Visit: Payer: Self-pay | Admitting: Endocrinology

## 2011-10-13 ENCOUNTER — Encounter (HOSPITAL_COMMUNITY)
Admission: RE | Admit: 2011-10-13 | Discharge: 2011-10-13 | Disposition: A | Discharge status: Home / Self Care | Payer: Medicare Other | Source: Ambulatory Visit | Attending: Surgery | Admitting: Surgery

## 2011-10-13 ENCOUNTER — Encounter (HOSPITAL_COMMUNITY)
Admission: RE | Admit: 2011-10-13 | Discharge: 2011-10-13 | Disposition: A | Discharge status: Home / Self Care | Payer: Medicare Other | Source: Ambulatory Visit | Attending: Orthopedic Surgery | Admitting: Orthopedic Surgery

## 2011-10-13 LAB — URINALYSIS, ROUTINE W REFLEX MICROSCOPIC
Bilirubin Urine: NEGATIVE
Glucose, UA: 250 mg/dL — AB
Hgb urine dipstick: NEGATIVE
Ketones, ur: NEGATIVE mg/dL
Leukocytes, UA: NEGATIVE
Nitrite: NEGATIVE
Protein, ur: 30 mg/dL — AB
Specific Gravity, Urine: 1.015 (ref 1.005–1.030)
Urobilinogen, UA: 0.2 mg/dL (ref 0.0–1.0)
pH: 5.5 (ref 5.0–8.0)

## 2011-10-13 LAB — ABO/RH: ABO/RH(D): A NEG

## 2011-10-13 LAB — URINE MICROSCOPIC-ADD ON

## 2011-10-13 LAB — SURGICAL PCR SCREEN
MRSA, PCR: NEGATIVE
Staphylococcus aureus: NEGATIVE

## 2011-10-13 LAB — TYPE AND SCREEN

## 2011-10-13 LAB — PROTIME-INR
INR: 0.92 (ref 0.00–1.49)
Prothrombin Time: 12.6 seconds (ref 11.6–15.2)

## 2011-10-13 NOTE — Progress Notes (Signed)
Spoke with Sherri, requested orders for surgery.

## 2011-10-17 ENCOUNTER — Telehealth: Payer: Self-pay | Admitting: *Deleted

## 2011-10-17 NOTE — Telephone Encounter (Signed)
R'cd phone call from Dr. Greig Right office for surgical clearance for pt's TKA on 6/26. Dr. Alwyn Ren has filled out clearance form and also wants to pt have clearance from Dr. Maple Hudson and Everardo All per his note. Form placed on MD's desk for review.

## 2011-10-18 MED ORDER — VANCOMYCIN HCL 1000 MG IV SOLR
1500.0000 mg | INTRAVENOUS | Status: AC
  Administered 2011-10-19: 1500 mg via INTRAVENOUS
  Filled 2011-10-18: qty 1500

## 2011-10-18 NOTE — Telephone Encounter (Signed)
Clearance form faxed to [employer].

## 2011-10-18 NOTE — Telephone Encounter (Signed)
done

## 2011-10-18 NOTE — H&P (Signed)
  [facility] [address]   [address] [place], [place] [address] [phone] A Division of [facility]  Loreta Ave, M.D.     Robert A. Thurston Hole, M.D.     Lunette Stands, M.D. Eulas Post, M.D.    Buford Dresser, M.D. Estell Harpin, M.D. Ralene Cork, D.O.          Genene Churn. Barry Dienes, PA-C            Kirstin A. Shepperson, PA-C Janace Litten, OPA-C   RE: Valerie, Stevenson                                [id number]      DOB: 19-May-1945 PROGRESS NOTE: 10-11-11 Chief complaint: Right knee pain.  History of present illness: 66 year-old white female with a history of end stage DJD, right knee, and chronic pain.  Returns.  States that knee symptoms are unchanged from previous visit.  She is wanting to proceed with total knee replacement as scheduled.  States that she was seen by her primary care physician, Dr. Alwyn Ren, today and he did an EKG, along with some labs.  He has not given his official clearance yet.  We are expecting Dr. George Hugh clearance possibly Monday.  She has been cleared by Dr. Jetty Duhamel and we have received verbal for that, but nothing in writing.   Current medications: Norvasc, Clonidine, Pravachol, Aspirin, Allegra and Lantus.  Allergies: Penicillin and Morphine. Past medical/surgical history: Diabetes, hypertension, depression, hypothyroidism, hysterectomy and left total knee replacement, toxic multinodular goiter, obstructive sleep apnea, glaucoma, renal disease, anemia, asthma/COPD, GERD, skin cancer, diverticulosis and idiopathic urticaria.   Review of systems: Patient currently denies fevers, chills, lightheadedness, dizziness, GU, cardiac or pulmonary issues.     Family history: Positive for coronary artery disease, stroke, diabetes, ovarian cancer, alcohol abuse, hypothyroidism and goiter. Social history: Does not smoke or drink.  Patient is married and currently retired.       EXAMINATION: Height: 5?6.  Weight: 268 pounds.  Respirations: 12.  Blood pressure: 147/72.  Pulse: 86.  Temperature: 97.4.  Pleasant white female, alert and oriented x 3 and in no acute distress.  Head is normocephalic, a traumatic.  PERRLA, EOMI.  Lungs: CTA bilaterally.  No wheezes.  Heart: RRR.  S1 and S2.  No murmurs.  Abdomen: Round and non-distended.  NBS x 4.  Soft and non-tender.  Right knee: Decreased range of motion.  Positive crepitus.  Joint line tender.  Ligaments stable.  Neurovascularly intact.  Skin warm and dry.  No increase in respiratory effort.    IMPRESSION: End stage DJD, right knee, and chronic pain.  Failed conservative treatment.    PLAN: We will proceed with right total knee replacement provided we get all appropriate clearances requested.  Advised that she can also help by contacting Dr. Alwyn Ren, Dr. Everardo All and Dr. Maple Hudson to make sure that we get these clearances before her surgery.  All questions answered.    Genene Churn. Barry Dienes, PA-C Electronically verified by Loreta Ave, M.D. JMO:jjh D 10-14-11 T 10-17-11

## 2011-10-18 NOTE — Progress Notes (Signed)
Left message for patient to arrive at short stay in the morning at 0700 due to schedule change, and that procedure is now scheduled for 1000.

## 2011-10-19 ENCOUNTER — Encounter (HOSPITAL_COMMUNITY): Payer: Self-pay | Admitting: Anesthesiology

## 2011-10-19 ENCOUNTER — Inpatient Hospital Stay (HOSPITAL_COMMUNITY)
Admission: RE | Admit: 2011-10-19 | Discharge: 2011-10-22 | DRG: 470 | Disposition: A | Discharge status: Skilled Nursing Facility | Payer: Medicare Other | Source: Ambulatory Visit | Attending: Orthopedic Surgery | Admitting: Orthopedic Surgery

## 2011-10-19 ENCOUNTER — Ambulatory Visit (HOSPITAL_COMMUNITY): Payer: Medicare Other | Admitting: Anesthesiology

## 2011-10-19 ENCOUNTER — Encounter (HOSPITAL_COMMUNITY)
Admission: RE | Disposition: A | Discharge status: Skilled Nursing Facility | Payer: Self-pay | Source: Ambulatory Visit | Attending: Orthopedic Surgery

## 2011-10-19 ENCOUNTER — Ambulatory Visit (HOSPITAL_COMMUNITY): Payer: Medicare Other

## 2011-10-19 DIAGNOSIS — E1065 Type 1 diabetes mellitus with hyperglycemia: Secondary | ICD-10-CM | POA: Diagnosis present

## 2011-10-19 DIAGNOSIS — Z471 Aftercare following joint replacement surgery: Secondary | ICD-10-CM

## 2011-10-19 DIAGNOSIS — J4520 Mild intermittent asthma, uncomplicated: Secondary | ICD-10-CM | POA: Diagnosis present

## 2011-10-19 DIAGNOSIS — E1129 Type 2 diabetes mellitus with other diabetic kidney complication: Secondary | ICD-10-CM | POA: Diagnosis present

## 2011-10-19 DIAGNOSIS — G4733 Obstructive sleep apnea (adult) (pediatric): Secondary | ICD-10-CM | POA: Diagnosis present

## 2011-10-19 DIAGNOSIS — M1710 Unilateral primary osteoarthritis, unspecified knee: Principal | ICD-10-CM | POA: Diagnosis present

## 2011-10-19 DIAGNOSIS — E1165 Type 2 diabetes mellitus with hyperglycemia: Secondary | ICD-10-CM

## 2011-10-19 DIAGNOSIS — E785 Hyperlipidemia, unspecified: Secondary | ICD-10-CM | POA: Diagnosis present

## 2011-10-19 DIAGNOSIS — H409 Unspecified glaucoma: Secondary | ICD-10-CM | POA: Diagnosis present

## 2011-10-19 DIAGNOSIS — IMO0001 Reserved for inherently not codable concepts without codable children: Secondary | ICD-10-CM | POA: Diagnosis present

## 2011-10-19 DIAGNOSIS — E039 Hypothyroidism, unspecified: Secondary | ICD-10-CM | POA: Diagnosis present

## 2011-10-19 DIAGNOSIS — I10 Essential (primary) hypertension: Secondary | ICD-10-CM

## 2011-10-19 DIAGNOSIS — Z96659 Presence of unspecified artificial knee joint: Secondary | ICD-10-CM

## 2011-10-19 DIAGNOSIS — E0590 Thyrotoxicosis, unspecified without thyrotoxic crisis or storm: Secondary | ICD-10-CM | POA: Diagnosis present

## 2011-10-19 DIAGNOSIS — K219 Gastro-esophageal reflux disease without esophagitis: Secondary | ICD-10-CM | POA: Diagnosis present

## 2011-10-19 DIAGNOSIS — K5730 Diverticulosis of large intestine without perforation or abscess without bleeding: Secondary | ICD-10-CM | POA: Diagnosis present

## 2011-10-19 DIAGNOSIS — F3289 Other specified depressive episodes: Secondary | ICD-10-CM | POA: Diagnosis present

## 2011-10-19 DIAGNOSIS — D509 Iron deficiency anemia, unspecified: Secondary | ICD-10-CM | POA: Diagnosis present

## 2011-10-19 DIAGNOSIS — J449 Chronic obstructive pulmonary disease, unspecified: Secondary | ICD-10-CM | POA: Diagnosis present

## 2011-10-19 DIAGNOSIS — E1029 Type 1 diabetes mellitus with other diabetic kidney complication: Secondary | ICD-10-CM | POA: Diagnosis present

## 2011-10-19 DIAGNOSIS — N058 Unspecified nephritic syndrome with other morphologic changes: Secondary | ICD-10-CM | POA: Diagnosis present

## 2011-10-19 DIAGNOSIS — Z01812 Encounter for preprocedural laboratory examination: Secondary | ICD-10-CM

## 2011-10-19 DIAGNOSIS — F329 Major depressive disorder, single episode, unspecified: Secondary | ICD-10-CM | POA: Diagnosis present

## 2011-10-19 DIAGNOSIS — IMO0002 Reserved for concepts with insufficient information to code with codable children: Secondary | ICD-10-CM | POA: Diagnosis present

## 2011-10-19 DIAGNOSIS — J4489 Other specified chronic obstructive pulmonary disease: Secondary | ICD-10-CM | POA: Diagnosis present

## 2011-10-19 HISTORY — PX: TOTAL KNEE ARTHROPLASTY: SHX125

## 2011-10-19 LAB — GLUCOSE, CAPILLARY
Glucose-Capillary: 183 mg/dL — ABNORMAL HIGH (ref 70–99)
Glucose-Capillary: 271 mg/dL — ABNORMAL HIGH (ref 70–99)

## 2011-10-19 SURGERY — ARTHROPLASTY, KNEE, TOTAL
Anesthesia: General | Site: Knee | Laterality: Right | Wound class: Clean

## 2011-10-19 MED ORDER — SIMVASTATIN 20 MG PO TABS
20.0000 mg | ORAL_TABLET | Freq: Every day | ORAL | Status: DC
  Administered 2011-10-19 – 2011-10-21 (×3): 20 mg via ORAL
  Filled 2011-10-19 (×4): qty 1

## 2011-10-19 MED ORDER — WARFARIN - PHARMACIST DOSING INPATIENT: Freq: Every day | Status: DC

## 2011-10-19 MED ORDER — HYDROMORPHONE HCL PF 1 MG/ML IJ SOLN
0.2500 mg | INTRAMUSCULAR | Status: DC | PRN
  Administered 2011-10-19 (×3): 0.5 mg via INTRAVENOUS

## 2011-10-19 MED ORDER — LACTATED RINGERS IV SOLN
INTRAVENOUS | Status: DC | PRN
  Administered 2011-10-19 (×2): via INTRAVENOUS

## 2011-10-19 MED ORDER — CLONIDINE HCL 0.1 MG PO TABS
0.1000 mg | ORAL_TABLET | Freq: Two times a day (BID) | ORAL | Status: DC
  Administered 2011-10-19 – 2011-10-22 (×6): 0.1 mg via ORAL
  Filled 2011-10-19 (×8): qty 1

## 2011-10-19 MED ORDER — WARFARIN SODIUM 7.5 MG PO TABS
7.5000 mg | ORAL_TABLET | Freq: Once | ORAL | Status: AC
  Administered 2011-10-19: 7.5 mg via ORAL
  Filled 2011-10-19: qty 1

## 2011-10-19 MED ORDER — AMLODIPINE BESYLATE 5 MG PO TABS: 5.0000 mg | ORAL_TABLET | Freq: Every day | ORAL | Status: DC

## 2011-10-19 MED ORDER — WARFARIN VIDEO: Freq: Once | Status: DC

## 2011-10-19 MED ORDER — ONDANSETRON HCL 4 MG/2ML IJ SOLN
INTRAMUSCULAR | Status: DC | PRN
  Administered 2011-10-19: 4 mg via INTRAVENOUS

## 2011-10-19 MED ORDER — POTASSIUM CHLORIDE IN NACL 20-0.9 MEQ/L-% IV SOLN
INTRAVENOUS | Status: DC
  Administered 2011-10-19 – 2011-10-20 (×2): via INTRAVENOUS
  Administered 2011-10-20: 1000 mL via INTRAVENOUS
  Filled 2011-10-19 (×5): qty 1000

## 2011-10-19 MED ORDER — VANCOMYCIN HCL IN DEXTROSE 1-5 GM/200ML-% IV SOLN
1000.0000 mg | Freq: Two times a day (BID) | INTRAVENOUS | Status: AC
Start: 2011-10-19 — End: 2011-10-20
  Administered 2011-10-19 – 2011-10-20 (×2): 1000 mg via INTRAVENOUS
  Filled 2011-10-19 (×2): qty 200

## 2011-10-19 MED ORDER — NEOSTIGMINE METHYLSULFATE 1 MG/ML IJ SOLN
INTRAMUSCULAR | Status: DC | PRN
  Administered 2011-10-19: 3 mg via INTRAVENOUS

## 2011-10-19 MED ORDER — SPIRONOLACTONE 50 MG PO TABS
50.0000 mg | ORAL_TABLET | Freq: Every day | ORAL | Status: DC
  Administered 2011-10-19 – 2011-10-22 (×4): 50 mg via ORAL
  Filled 2011-10-19 (×4): qty 1

## 2011-10-19 MED ORDER — METHOCARBAMOL 100 MG/ML IJ SOLN
500.0000 mg | Freq: Four times a day (QID) | INTRAVENOUS | Status: DC | PRN
  Filled 2011-10-19: qty 5

## 2011-10-19 MED ORDER — DOCUSATE SODIUM 100 MG PO CAPS
100.0000 mg | ORAL_CAPSULE | Freq: Two times a day (BID) | ORAL | Status: DC
Start: 2011-10-19 — End: 2011-10-22
  Administered 2011-10-19 – 2011-10-22 (×6): 100 mg via ORAL
  Filled 2011-10-19 (×8): qty 1

## 2011-10-19 MED ORDER — HYDROMORPHONE HCL PF 1 MG/ML IJ SOLN
1.0000 mg | INTRAMUSCULAR | Status: DC | PRN
  Administered 2011-10-19 – 2011-10-20 (×5): 1 mg via INTRAVENOUS
  Filled 2011-10-19 (×5): qty 1

## 2011-10-19 MED ORDER — PATIENT'S GUIDE TO USING COUMADIN BOOK
Freq: Once | Status: AC
  Administered 2011-10-19: 19:00:00
  Filled 2011-10-19: qty 1

## 2011-10-19 MED ORDER — BUPROPION HCL ER (XL) 150 MG PO TB24
150.0000 mg | ORAL_TABLET | Freq: Every day | ORAL | Status: DC
  Administered 2011-10-20 – 2011-10-22 (×3): 150 mg via ORAL
  Filled 2011-10-19 (×3): qty 1

## 2011-10-19 MED ORDER — DIPHENHYDRAMINE HCL 50 MG/ML IJ SOLN
INTRAMUSCULAR | Status: AC
  Filled 2011-10-19: qty 1

## 2011-10-19 MED ORDER — METOCLOPRAMIDE HCL 10 MG PO TABS: 5.0000 mg | ORAL_TABLET | Freq: Three times a day (TID) | ORAL | Status: DC | PRN

## 2011-10-19 MED ORDER — ROCURONIUM BROMIDE 100 MG/10ML IV SOLN
INTRAVENOUS | Status: DC | PRN
  Administered 2011-10-19: 50 mg via INTRAVENOUS

## 2011-10-19 MED ORDER — DIPHENHYDRAMINE HCL 50 MG/ML IJ SOLN
12.5000 mg | Freq: Once | INTRAMUSCULAR | Status: AC
  Administered 2011-10-19: 12.5 mg via INTRAVENOUS

## 2011-10-19 MED ORDER — HYDROMORPHONE HCL PF 1 MG/ML IJ SOLN
INTRAMUSCULAR | Status: AC
  Filled 2011-10-19: qty 1

## 2011-10-19 MED ORDER — ROPIVACAINE HCL 5 MG/ML IJ SOLN
INTRAMUSCULAR | Status: DC | PRN
  Administered 2011-10-19: 30 mL

## 2011-10-19 MED ORDER — ACETAMINOPHEN 650 MG RE SUPP: 650.0000 mg | Freq: Four times a day (QID) | RECTAL | Status: DC | PRN

## 2011-10-19 MED ORDER — PROPOFOL 10 MG/ML IV EMUL
INTRAVENOUS | Status: DC | PRN
  Administered 2011-10-19: 150 mg via INTRAVENOUS

## 2011-10-19 MED ORDER — BUPIVACAINE HCL (PF) 0.25 % IJ SOLN
INTRAMUSCULAR | Status: DC | PRN
  Administered 2011-10-19: 30 mL

## 2011-10-19 MED ORDER — LORAZEPAM 0.5 MG PO TABS
0.5000 mg | ORAL_TABLET | Freq: Two times a day (BID) | ORAL | Status: DC | PRN
  Administered 2011-10-19 – 2011-10-20 (×3): 0.5 mg via ORAL
  Filled 2011-10-19 (×3): qty 1

## 2011-10-19 MED ORDER — PHENOL 1.4 % MT LIQD: 1.0000 | OROMUCOSAL | Status: DC | PRN

## 2011-10-19 MED ORDER — OXYCODONE-ACETAMINOPHEN 5-325 MG PO TABS
1.0000 | ORAL_TABLET | ORAL | Status: DC | PRN
  Administered 2011-10-19 – 2011-10-20 (×2): 2 via ORAL
  Filled 2011-10-19 (×2): qty 2

## 2011-10-19 MED ORDER — ONDANSETRON HCL 4 MG/2ML IJ SOLN
4.0000 mg | Freq: Four times a day (QID) | INTRAMUSCULAR | Status: DC | PRN
  Administered 2011-10-20: 4 mg via INTRAVENOUS
  Filled 2011-10-19: qty 2

## 2011-10-19 MED ORDER — MIDAZOLAM HCL 2 MG/2ML IJ SOLN
INTRAMUSCULAR | Status: AC
  Filled 2011-10-19: qty 2

## 2011-10-19 MED ORDER — ACETAMINOPHEN 325 MG PO TABS: 650.0000 mg | ORAL_TABLET | Freq: Four times a day (QID) | ORAL | Status: DC | PRN

## 2011-10-19 MED ORDER — INSULIN ASPART 100 UNIT/ML ~~LOC~~ SOLN: 10.0000 [IU] | Freq: Three times a day (TID) | SUBCUTANEOUS | Status: DC

## 2011-10-19 MED ORDER — INSULIN ASPART 100 UNIT/ML ~~LOC~~ SOLN
0.0000 [IU] | Freq: Every day | SUBCUTANEOUS | Status: DC
  Administered 2011-10-19: 3 [IU] via SUBCUTANEOUS
  Administered 2011-10-20: 2 [IU] via SUBCUTANEOUS

## 2011-10-19 MED ORDER — MENTHOL 3 MG MT LOZG
1.0000 | LOZENGE | OROMUCOSAL | Status: DC | PRN
  Filled 2011-10-19: qty 9

## 2011-10-19 MED ORDER — ENOXAPARIN SODIUM 30 MG/0.3ML ~~LOC~~ SOLN
30.0000 mg | Freq: Two times a day (BID) | SUBCUTANEOUS | Status: DC
  Administered 2011-10-20 – 2011-10-22 (×5): 30 mg via SUBCUTANEOUS
  Filled 2011-10-19 (×7): qty 0.3

## 2011-10-19 MED ORDER — AMLODIPINE BESYLATE 5 MG PO TABS
5.0000 mg | ORAL_TABLET | Freq: Two times a day (BID) | ORAL | Status: DC
  Administered 2011-10-19 – 2011-10-22 (×6): 5 mg via ORAL
  Filled 2011-10-19 (×7): qty 1

## 2011-10-19 MED ORDER — ONDANSETRON HCL 4 MG PO TABS: 4.0000 mg | ORAL_TABLET | Freq: Four times a day (QID) | ORAL | Status: DC | PRN

## 2011-10-19 MED ORDER — FENTANYL CITRATE 0.05 MG/ML IJ SOLN
INTRAMUSCULAR | Status: DC | PRN
  Administered 2011-10-19 (×2): 50 ug via INTRAVENOUS
  Administered 2011-10-19: 100 ug via INTRAVENOUS
  Administered 2011-10-19 (×2): 50 ug via INTRAVENOUS
  Administered 2011-10-19: 150 ug via INTRAVENOUS
  Administered 2011-10-19: 50 ug via INTRAVENOUS

## 2011-10-19 MED ORDER — METOCLOPRAMIDE HCL 5 MG/ML IJ SOLN: 5.0000 mg | Freq: Three times a day (TID) | INTRAMUSCULAR | Status: DC | PRN

## 2011-10-19 MED ORDER — INSULIN ASPART 100 UNIT/ML ~~LOC~~ SOLN
0.0000 [IU] | Freq: Three times a day (TID) | SUBCUTANEOUS | Status: DC
  Administered 2011-10-20: 11 [IU] via SUBCUTANEOUS
  Administered 2011-10-20: 4 [IU] via SUBCUTANEOUS
  Administered 2011-10-20: 7 [IU] via SUBCUTANEOUS
  Administered 2011-10-21: 11 [IU] via SUBCUTANEOUS
  Administered 2011-10-21 (×2): 7 [IU] via SUBCUTANEOUS
  Administered 2011-10-22: 11 [IU] via SUBCUTANEOUS
  Administered 2011-10-22: 4 [IU] via SUBCUTANEOUS

## 2011-10-19 MED ORDER — FENTANYL CITRATE 0.05 MG/ML IJ SOLN
50.0000 ug | INTRAMUSCULAR | Status: DC | PRN
  Administered 2011-10-19 (×2): 50 ug via INTRAVENOUS

## 2011-10-19 MED ORDER — FENTANYL CITRATE 0.05 MG/ML IJ SOLN
INTRAMUSCULAR | Status: AC
Start: 2011-10-19 — End: 2011-10-19
  Filled 2011-10-19: qty 2

## 2011-10-19 MED ORDER — PROMETHAZINE HCL 25 MG/ML IJ SOLN: 6.2500 mg | INTRAMUSCULAR | Status: DC | PRN

## 2011-10-19 MED ORDER — FAMOTIDINE 20 MG PO TABS
20.0000 mg | ORAL_TABLET | Freq: Every day | ORAL | Status: DC
  Administered 2011-10-20 – 2011-10-22 (×3): 20 mg via ORAL
  Filled 2011-10-19 (×3): qty 1

## 2011-10-19 MED ORDER — FENTANYL CITRATE 0.05 MG/ML IJ SOLN: 25.0000 ug | INTRAMUSCULAR | Status: DC | PRN

## 2011-10-19 MED ORDER — MIDAZOLAM HCL 5 MG/5ML IJ SOLN
INTRAMUSCULAR | Status: DC | PRN
  Administered 2011-10-19: 2 mg via INTRAVENOUS

## 2011-10-19 MED ORDER — FENTANYL CITRATE 0.05 MG/ML IJ SOLN
INTRAMUSCULAR | Status: AC
  Filled 2011-10-19: qty 2

## 2011-10-19 MED ORDER — METHOCARBAMOL 100 MG/ML IJ SOLN
500.0000 mg | Freq: Once | INTRAVENOUS | Status: AC
  Administered 2011-10-19: 500 mg via INTRAVENOUS
  Filled 2011-10-19: qty 5

## 2011-10-19 MED ORDER — SODIUM CHLORIDE 0.9 % IR SOLN
Status: DC | PRN
  Administered 2011-10-19: 3000 mL
  Administered 2011-10-19: 1000 mL

## 2011-10-19 MED ORDER — SENNOSIDES-DOCUSATE SODIUM 8.6-50 MG PO TABS: 1.0000 | ORAL_TABLET | Freq: Every evening | ORAL | Status: DC | PRN

## 2011-10-19 MED ORDER — LORATADINE 10 MG PO TABS
10.0000 mg | ORAL_TABLET | Freq: Every day | ORAL | Status: DC
  Administered 2011-10-19 – 2011-10-22 (×4): 10 mg via ORAL
  Filled 2011-10-19 (×4): qty 1

## 2011-10-19 MED ORDER — INSULIN GLARGINE 100 UNIT/ML ~~LOC~~ SOLN: 40.0000 [IU] | Freq: Every day | SUBCUTANEOUS | Status: DC

## 2011-10-19 MED ORDER — INSULIN GLARGINE 100 UNIT/ML ~~LOC~~ SOLN
20.0000 [IU] | Freq: Every day | SUBCUTANEOUS | Status: DC
  Administered 2011-10-19: 20 [IU] via SUBCUTANEOUS

## 2011-10-19 MED ORDER — METHOCARBAMOL 500 MG PO TABS
500.0000 mg | ORAL_TABLET | Freq: Four times a day (QID) | ORAL | Status: DC | PRN
  Administered 2011-10-19 – 2011-10-20 (×3): 500 mg via ORAL
  Filled 2011-10-19 (×3): qty 1

## 2011-10-19 MED ORDER — LACTATED RINGERS IV SOLN
INTRAVENOUS | Status: DC
  Administered 2011-10-19: 09:00:00 via INTRAVENOUS

## 2011-10-19 MED ORDER — MIDAZOLAM HCL 2 MG/2ML IJ SOLN
1.0000 mg | INTRAMUSCULAR | Status: DC | PRN
  Administered 2011-10-19: 2 mg via INTRAVENOUS

## 2011-10-19 MED ORDER — GLYCOPYRROLATE 0.2 MG/ML IJ SOLN
INTRAMUSCULAR | Status: DC | PRN
  Administered 2011-10-19: 0.4 mg via INTRAVENOUS

## 2011-10-19 SURGICAL SUPPLY — 67 items
BANDAGE ESMARK 6X9 LF (GAUZE/BANDAGES/DRESSINGS) ×1 IMPLANT
BLADE SAG 18X100X1.27 (BLADE) ×6 IMPLANT
BLADE SURG 10 STRL SS (BLADE) ×2 IMPLANT
BNDG ESMARK 6X9 LF (GAUZE/BANDAGES/DRESSINGS) ×2
BOOTCOVER CLEANROOM LRG (PROTECTIVE WEAR) ×4 IMPLANT
BOWL SMART MIX CTS (DISPOSABLE) ×2 IMPLANT
CATH FOLEY LATEX FREE 14FR (CATHETERS) ×1
CATH FOLEY LF 14FR (CATHETERS) ×1 IMPLANT
CEMENT BONE SIMPLEX SPEEDSET (Cement) ×4 IMPLANT
CHLORAPREP W/TINT 26ML (MISCELLANEOUS) ×2 IMPLANT
CLOTH BEACON ORANGE TIMEOUT ST (SAFETY) ×2 IMPLANT
COVER BACK TABLE 24X17X13 BIG (DRAPES) ×2 IMPLANT
COVER SURGICAL LIGHT HANDLE (MISCELLANEOUS) ×2 IMPLANT
CUFF TOURNIQUET SINGLE 34IN LL (TOURNIQUET CUFF) ×2 IMPLANT
DRAPE EXTREMITY T 121X128X90 (DRAPE) ×2 IMPLANT
DRAPE INCISE 23X17 IOBAN STRL (DRAPES) ×1
DRAPE INCISE IOBAN 23X17 STRL (DRAPES) ×1 IMPLANT
DRAPE PROXIMA HALF (DRAPES) ×2 IMPLANT
DRAPE U-SHAPE 47X51 STRL (DRAPES) ×2 IMPLANT
DRSG PAD ABDOMINAL 8X10 ST (GAUZE/BANDAGES/DRESSINGS) ×4 IMPLANT
DURAPREP 26ML APPLICATOR (WOUND CARE) IMPLANT
ELECT CAUTERY BLADE 6.4 (BLADE) ×2 IMPLANT
ELECT REM PT RETURN 9FT ADLT (ELECTROSURGICAL) ×2
ELECTRODE REM PT RTRN 9FT ADLT (ELECTROSURGICAL) ×1 IMPLANT
EVACUATOR 1/8 PVC DRAIN (DRAIN) ×2 IMPLANT
FACESHIELD LNG OPTICON STERILE (SAFETY) ×2 IMPLANT
GAUZE XEROFORM 5X9 LF (GAUZE/BANDAGES/DRESSINGS) ×2 IMPLANT
GLOVE BIOGEL PI IND STRL 7.0 (GLOVE) ×1 IMPLANT
GLOVE BIOGEL PI IND STRL 8 (GLOVE) ×1 IMPLANT
GLOVE BIOGEL PI INDICATOR 7.0 (GLOVE) ×1
GLOVE BIOGEL PI INDICATOR 8 (GLOVE) ×1
GLOVE ORTHO TXT STRL SZ7.5 (GLOVE) ×2 IMPLANT
GLOVE SURG SS PI 7.0 STRL IVOR (GLOVE) ×2 IMPLANT
GLOVE SURG SS PI 7.5 STRL IVOR (GLOVE) ×8 IMPLANT
GLOVE SURG SS PI 8.0 STRL IVOR (GLOVE) ×2 IMPLANT
GOWN PREVENTION PLUS XLARGE (GOWN DISPOSABLE) IMPLANT
GOWN STRL NON-REIN LRG LVL3 (GOWN DISPOSABLE) ×4 IMPLANT
GOWN STRL REIN 2XL XLG LVL4 (GOWN DISPOSABLE) ×4 IMPLANT
HANDPIECE INTERPULSE COAX TIP (DISPOSABLE) ×1
HOOD PEEL AWAY FACE SHEILD DIS (HOOD) ×2 IMPLANT
IMMOBILIZER KNEE 22 UNIV (SOFTGOODS) ×2 IMPLANT
IMMOBILIZER KNEE 24 THIGH 36 (MISCELLANEOUS) IMPLANT
IMMOBILIZER KNEE 24 UNIV (MISCELLANEOUS)
KIT BASIN OR (CUSTOM PROCEDURE TRAY) ×2 IMPLANT
KIT ROOM TURNOVER OR (KITS) ×2 IMPLANT
MANIFOLD NEPTUNE II (INSTRUMENTS) ×2 IMPLANT
NS IRRIG 1000ML POUR BTL (IV SOLUTION) ×2 IMPLANT
PACK TOTAL JOINT (CUSTOM PROCEDURE TRAY) ×2 IMPLANT
PAD ARMBOARD 7.5X6 YLW CONV (MISCELLANEOUS) ×4 IMPLANT
PAD CAST 4YDX4 CTTN HI CHSV (CAST SUPPLIES) ×1 IMPLANT
PADDING CAST COTTON 4X4 STRL (CAST SUPPLIES) ×1
PADDING CAST COTTON 6X4 STRL (CAST SUPPLIES) ×2 IMPLANT
RUBBERBAND STERILE (MISCELLANEOUS) ×2 IMPLANT
SET HNDPC FAN SPRY TIP SCT (DISPOSABLE) ×1 IMPLANT
SPONGE GAUZE 4X4 12PLY (GAUZE/BANDAGES/DRESSINGS) ×2 IMPLANT
STAPLER VISISTAT 35W (STAPLE) ×2 IMPLANT
SUCTION FRAZIER TIP 10 FR DISP (SUCTIONS) ×2 IMPLANT
SUT VIC AB 1 CTX 36 (SUTURE) ×2
SUT VIC AB 1 CTX36XBRD ANBCTR (SUTURE) ×2 IMPLANT
SUT VIC AB 2-0 CT1 27 (SUTURE) ×2
SUT VIC AB 2-0 CT1 TAPERPNT 27 (SUTURE) ×2 IMPLANT
SYR 30ML LL (SYRINGE) ×2 IMPLANT
SYR 30ML SLIP (SYRINGE) ×2 IMPLANT
TOWEL OR 17X24 6PK STRL BLUE (TOWEL DISPOSABLE) ×2 IMPLANT
TOWEL OR 17X26 10 PK STRL BLUE (TOWEL DISPOSABLE) ×2 IMPLANT
TRAY FOLEY CATH 14FR (SET/KITS/TRAYS/PACK) IMPLANT
WATER STERILE IRR 1000ML POUR (IV SOLUTION) ×2 IMPLANT

## 2011-10-19 NOTE — Interval H&P Note (Signed)
History and Physical Interval Note:  10/19/2011 8:26 AM  Valerie Stevenson  has presented today for surgery, with the diagnosis of DJD RIGHT KNEE  The various methods of treatment have been discussed with the patient and family. After consideration of risks, benefits and other options for treatment, the patient has consented to  Procedure(s) (LRB): TOTAL KNEE ARTHROPLASTY (Right) as a surgical intervention .  The patient's history has been reviewed, patient examined, no change in status, stable for surgery.  I have reviewed the patients' chart and labs.  Questions were answered to the patient's satisfaction.     [name] F

## 2011-10-19 NOTE — Progress Notes (Signed)
ANTICOAGULATION CONSULT NOTE - Initial Consult  Pharmacy Consult for Coumadin Indication: VTE prophylaxis s/p right TKA  Allergies  Allergen Reactions  . Latex     rash  . Penicillins     REACTION: Hives  . Shellfish-Derived Products     Anaphylactoid reaction   . Morphine And Related Itching    Patient Measurements: Height: 5' 5.5" (166.4 cm) Weight: 267 lb 6.7 oz (121.3 kg) IBW/kg (Calculated) : 58.15   Vital Signs: Temp: 98 F (36.7 C) (06/26 1147) Temp src: Oral (06/26 0755) BP: 151/86 mmHg (06/26 1415) Pulse Rate: 83  (06/26 1415)  Pre-op labs:   10/11/11: H/H 14.3/43.7, platelet count 212K       Creatinine 1.5, LFTs ok.     10/13/11:  PT = 12.6 seconds, INR = 0.92   09/21/11:  TSH 1.38   Estimated Creatinine Clearance: 48.6 ml/min (by C-G formula based on Cr of 1.5).   Medical History: Past Medical History  Diagnosis Date  . Diabetes mellitus      IDDM;Dr Everardo All  . Toxic multinodular goiter     Dr Everardo All  . OSA (obstructive sleep apnea)     CPAP;Dr Clint Young  . Glaucoma   . Anemia     iron deficinecy  . Asthma   . COPD (chronic obstructive pulmonary disease)   . Depression   . GERD (gastroesophageal reflux disease)   . Hyperlipidemia   . Hypertension   . Hyperthyroidism     in context of nodule; Tapazole therapy since 2007  . Skin cancer     basal cell  . Diverticulosis of colon   . Urticaria, idiopathic   . Renal disease     insufficiency   -- saw Nephrologist  in [place]..hasn't seen one in 2-3 yrs  . Arthritis   . Eczema     Home Medications continued:    Amlodipine 5 mg daily,  Wellbutrin XL 150 mg daily, Clonidine 0.1 mg BID, Allegra 180 mg daily (sub Loratidine 10 mg daily), Lorazepam 0.5 mg BID anxiety, Pravastatin 40 mg daily (sub Zocor 20 mg daily),   Probioitic daily, Ranitidine 150 mg BID (sub Famotidine 20 mg BID), Spironolactone 50 mg daily,  Lantus 40 units qhs  Home meds NOT continued:   Aspirin (coated) 81 mg daily,  Probiotic daily, home Novolog meal coverage and sliding scale  Assessment:   To begin Coumadin tonight, s/p right TKA.   Hx hyperthyroidism noted, s/p I-131 tx. TSH ok 09/21/11, no longer on Tapazole.  Goal of Therapy:  INR 2-3 Monitor platelets by anticoagulation protocol: Yes   Plan:    Coumadin 7.5 mg PO tonight.   Lovenox 30 mg SQ q12hrs to begin 6/27 am and continue until INR > or = 1.8.   Daily PT/INR to begin in am.   Coumadin education prior to discharge; will provide education materials.     Add sliding scale insulin coverage?  Dennie Fetters, RPh Pager: [phone] 10/19/2011,4:50 PM

## 2011-10-19 NOTE — Progress Notes (Signed)
Orthopedic Tech Progress Note Patient Details:  Valerie Stevenson 1945/11/17 [id number]  CPM Right Knee CPM Right Knee: On Right Knee Flexion (Degrees): 60  Right Knee Extension (Degrees): 0   OHF will be applied at first available.  Leo Grosser T 10/19/2011, 12:29 PM

## 2011-10-19 NOTE — Anesthesia Preprocedure Evaluation (Addendum)
Anesthesia Evaluation  Patient identified by MRN, date of birth, ID band Patient awake    Reviewed: Allergy & Precautions, H&P , NPO status , Patient's Chart, lab work & pertinent test results  Airway Mallampati: II TM Distance: >3 FB Neck ROM: Full    Dental  (+) Dental Advisory Given Temporary crown/filling left upper rear molar.:   Pulmonary asthma , sleep apnea and Continuous Positive Airway Pressure Ventilation , COPD breath sounds clear to auscultation  Pulmonary exam normal       Cardiovascular hypertension, Pt. on medications Rhythm:Regular Rate:Normal  Stress test January 2013 was OK per patient. Kathryne Sharper). ECHO reviewed. EF 70%   Neuro/Psych PSYCHIATRIC DISORDERS Depression negative neurological ROS     GI/Hepatic Neg liver ROS, GERD-  Medicated and Controlled,  Endo/Other  Diabetes mellitus-, Poorly Controlled, Type 1, Insulin DependentHyperthyroidism Morbid obesity  Renal/GU Renal InsufficiencyRenal diseaseCr 1.5  negative genitourinary   Musculoskeletal negative musculoskeletal ROS (+)   Abdominal (+) + obese,   Peds negative pediatric ROS (+)  Hematology negative hematology ROS (+)   Anesthesia Other Findings   Reproductive/Obstetrics negative OB ROS                       Anesthesia Physical Anesthesia Plan  ASA: III  Anesthesia Plan: General   Post-op Pain Management:    Induction: Intravenous  Airway Management Planned: Oral ETT  Additional Equipment:   Intra-op Plan:   Post-operative Plan: Extubation in OR  Informed Consent: I have reviewed the patients History and Physical, chart, labs and discussed the procedure including the risks, benefits and alternatives for the proposed anesthesia with the patient or authorized representative who has indicated his/her understanding and acceptance.   Dental advisory given  Plan Discussed with: CRNA  Anesthesia Plan  Comments: (Discussed risks of femoral nerve block including failure, bleeding, infection, nerve damage.  Femoral nerve block does not usually prevent all pain. Specifically, it treats the anterior, but often not the posterior knee. Questions answered.  Patient consents to block. )       Anesthesia Quick Evaluation

## 2011-10-19 NOTE — Op Note (Signed)
Valerie Stevenson, Valerie Stevenson NO.:  [id number]  MEDICAL RECORD NO.:  [id number]  LOCATION:  5N04C                        FACILITY:  [facility]  PHYSICIAN:  Loreta Ave, M.D. DATE OF BIRTH:  [birth year]  DATE OF PROCEDURE:  10/19/2011 DATE OF DISCHARGE:                              OPERATIVE REPORT   PREOPERATIVE DIAGNOSES:  End-stage degenerative arthritis, right knee. Exogenous central obesity.  Right knee with a fixed varus alignment with moderate bone loss, medial compartment, especially on the tibia.  PROCEDURE:  Right knee modified minimally invasive total knee replacement with Stryker Triathlon prosthesis.  Soft tissue balancing. Cemented pegged posterior stabilized #2 femoral component.  Cemented #3 tibial component 9 mm polyethylene insert.  Cemented resurfacing 32 mm patellar component.  SURGEON:  Loreta Ave, MD  ASSISTANT:  Zonia Kief, PA present throughout the entire case and necessary for timely completion of procedure.  ANESTHESIA:  General.  BLOOD LOSS:  Minimal.  SPECIMENS:  None.  CULTURES:  None.  COMPLICATION:  None.  DRESSING:  Soft compressive knee immobilizer.  DRAINS:  Hemovac x1.  TOURNIQUET TIME:  One hour.  PROCEDURE:  The patient was brought to operating room and placed on the operating table in supine position.  After adequate anesthesia had been obtained, knee examined.  Relatively fixed varus more than 5 degrees. Fairly good extension and flexion 90 degrees.  Tourniquet applied. Prepped and draped in usual sterile fashion.  Exsanguinated with elevation, Esmarch.  Tourniquet inflated to 350 mmHg.  Straight incision above the patella down to tibial tubercle.  Hemostasis with cautery. Medial arthrotomy, vastus splitting, preserving quad tendon.  Knee exposed.  Moderate bone loss medially.  Periarticular spurs, remnants of menisci, cruciate ligaments, loose bodies removed.  Distal femur exposed.  Intramedullary guide  placed.  A 8 mm resection, 5 degrees of valgus.  Using epicondylar axis, the femur was sized, cut, and fitted for a posterior stabilized #2 femoral component.  Proximal tibia exposed.  Resection below the defect medially, 3 degree posterior slope cut.  Extramedullary guide.  Size #3 component.  Patella exposed. Posterior 10 mm removed, drilled, sized, and fitted for a 32 mm component.  Spurs, loose bodies cleared throughout the entire knee. Trials put in place, #2 on the femur, #3 on the tibia, 9 mm insert and a 32 patella.  With this construct very pleased with biomechanical axis, flexion, extension as well as patellar tracking.  Nicely balanced in flexion, extension.  Tibia was marked for appropriate rotation and reamed.  All trials removed.  Copious irrigation with a pulse irrigating device.  Cement prepared and placed on all components, firmly seated. Polyethylene attached to tibia, knee reduced.  Patella held with clamp. Once cement hardened, the knee was reexamined.  Again, pleased with alignment, stability, patellar tracking.  Hemovac placed.  Arthrotomy closed with #1 Vicryl.  Skin and subcutaneous tissue with Vicryl staples.  Sterile compressive dressing applied.  Tourniquet deflated and removed.  Knee immobilizer applied.  Anesthesia reversed.  Brought to recovery room.  Tolerated surgery well.  No complications.     Loreta Ave, M.D.     DFM/MEDQ  D:  10/19/2011  T:  10/19/2011  Job:  [id number]

## 2011-10-19 NOTE — Consult Note (Signed)
[facility] Medical Consultation  Valerie Stevenson UJW:[id number] DOB: December 25, 1945 DOA: 10/19/2011 PCP: Marga Melnick, MD   Requesting MD: Mckinley Jewel   Date of consultation: 10/19/11  Reason for consultation: medical management   HPI:  66 yo woman who had R TKR today. We were called for medical management for diabetes, copd, HTN, osa, GERD. Patient is worried about the insulin dose. She doesn't want to take 40 units of Lantus like at home.  She hasn't eaten much since her surgery and she is worried about hypoglycemia.   Review of Systems:  Positive for the postoperative right knee pain, no chest pain or shortness of breath, no nausea, no vomiting, no focal weakness or numbness. Was systems reviewed and negative or per history of present illness  Past Medical History  Diagnosis Date  . Diabetes mellitus      IDDM;Dr Everardo All  . Toxic multinodular goiter     Dr Everardo All  . OSA (obstructive sleep apnea)     CPAP;Dr Clint Young  . Glaucoma   . Anemia     iron deficinecy  . Asthma   . COPD (chronic obstructive pulmonary disease)   . Depression   . GERD (gastroesophageal reflux disease)   . Hyperlipidemia   . Hypertension   . Hyperthyroidism     in context of nodule; Tapazole therapy since 2007  . Skin cancer     basal cell  . Diverticulosis of colon   . Urticaria, idiopathic   . Renal disease     insufficiency   -- saw Nephrologist  in [place]..hasn't seen one in 2-3 yrs  . Arthritis   . Eczema    Past Surgical History  Procedure Date  . Total abdominal hysterectomy w/ bilateral salpingoophorectomy   . Replacement total knee 2003    left ; Dr Sullivan Lone; [place]   Social History:  reports that she has never smoked. She has never used smokeless tobacco. She reports that she does not drink alcohol or use illicit drugs.  Allergies  Allergen Reactions  . Latex     rash  . Penicillins     REACTION: Hives  . Shellfish-Derived Products     Anaphylactoid  reaction   . Morphine And Related Itching   Family History  Problem Relation Age of Onset  . Coronary artery disease Father     no MI  . Stroke Mother     in 16s  . Diabetes Mother   . Ovarian cancer Sister     twin sibling  . Alcohol abuse Maternal Grandfather   . Hyperthyroidism Sister     twin  . Goiter Sister     resected  . COPD Father     Prior to Admission medications   Medication Sig Start Date End Date Taking? Authorizing Provider  amLODipine (NORVASC) 5 MG tablet TAKE ONE TABLET BY MOUTH TWICE DAILY 08/30/11  Yes Pecola Lawless, MD  buPROPion (WELLBUTRIN XL) 150 MG 24 hr tablet Take 1 tablet (150 mg total) by mouth daily. 09/06/11  Yes Pecola Lawless, MD  cloNIDine (CATAPRES) 0.1 MG tablet Take 1 tablet (0.1 mg total) by mouth 2 (two) times daily. 09/06/11  Yes Pecola Lawless, MD  fexofenadine (ALLEGRA) 180 MG tablet Take 180 mg by mouth daily. 1 by mouth in the am   Yes Historical Provider, MD  insulin aspart (NOVOLOG FLEXPEN) 100 UNIT/ML injection Inject subcutaneously three times a day (just before each meal) 35-20-40 units 10/12/11  Yes Romero Belling, MD  insulin aspart (NOVOLOG) 100 UNIT/ML injection Inject subcutaneously three times a day (just before each meal) 35 units with breakfast, 20 units with lunch, and 40 units with dinner 10/04/11  Yes Romero Belling, MD  insulin glargine (LANTUS) 100 UNIT/ML injection Inject 40 Units into the skin at bedtime. 10/03/11  Yes Romero Belling, MD  pravastatin (PRAVACHOL) 40 MG tablet Take 40 mg by mouth daily.     Yes Historical Provider, MD  Probiotic Product (PROBIOTIC PO) Take 1 tablet by mouth daily.   Yes Historical Provider, MD  ranitidine (ZANTAC) 150 MG tablet TAKE ONE TABLET BY MOUTH TWICE DAILY 07/03/11  Yes Pecola Lawless, MD  spironolactone (ALDACTONE) 50 MG tablet TAKE ONE TABLET BY MOUTH EVERY DAY 09/27/11  Yes Pecola Lawless, MD  aspirin EC 81 MG tablet Take 81 mg by mouth daily.    Historical Provider, MD  B-D UF III  MINI PEN NEEDLES 31G X 5 MM MISC As directed 07/05/10   Historical Provider, MD  Glucose Blood (PRECISION XTRA TEST VI)     Historical Provider, MD  Insulin Syringe-Needle U-100 (INSULIN SYRINGE 1CC/31GX5/16") 31G X 5/16" 1 ML MISC Use as directed once daily 06/07/11   Romero Belling, MD  LORazepam (ATIVAN) 0.5 MG tablet Take 0.5 mg by mouth as needed. For anxiety 08/30/10   Pecola Lawless, MD  RELION PEN NEEDLE 31G/8MM 31G X 8 MM MISC THREE TIMES DAILY 09/13/11   Romero Belling, MD  RELION ULTRA THIN PLUS LANCETS MISC As directed  06/30/10   Historical Provider, MD   Physical Exam: Blood pressure 175/59, pulse 92, temperature 98 F (36.7 C), temperature source Oral, resp. rate 15, height 5' 5.5" (1.664 m), weight 121.3 kg (267 lb 6.7 oz), SpO2 97.00%. Filed Vitals:   10/19/11 1415 10/19/11 1500 10/19/11 1600 10/19/11 1845  BP: 151/86   175/59  Pulse: 83   92  Temp:      TempSrc:      Resp: 10  16 15   Height:  5' 5.5" (1.664 m)    Weight:  121.3 kg (267 lb 6.7 oz)    SpO2: 100%  100% 97%   Patient Vitals for the past 24 hrs:  BP Temp Temp src Pulse Resp SpO2 Height Weight  10/19/11 1845 175/59 mmHg - - 92  15  97 % - -  10/19/11 1600 - - - - 16  100 % - -  10/19/11 1500 - - - - - - 5' 5.5" (1.664 m) 121.3 kg (267 lb 6.7 oz)  10/19/11 1415 151/86 mmHg - - 83  10  100 % - -  10/19/11 1405 183/50 mmHg - - 89  22  97 % - -  10/19/11 1400 - - - 79  13  99 % - -  10/19/11 1358 - - - 90  14  100 % - -  10/19/11 1349 171/66 mmHg - - 92  14  99 % - -  10/19/11 1345 - - - 89  17  100 % - -  10/19/11 1334 167/63 mmHg - - 81  10  99 % - -  10/19/11 1330 - - - 93  23  100 % - -  10/19/11 1319 150/58 mmHg - - - 13  - - -  10/19/11 1315 150/58 mmHg - - 92  12  98 % - -  10/19/11 1300 - - - 93  11  97 % - -  10/19/11 1248 166/61 mmHg - -  94  18  97 % - -  10/19/11 1245 - - - 90  15  93 % - -  10/19/11 1233 154/48 mmHg - - 83  18  97 % - -  10/19/11 1230 - - - 84  9  96 % - -  10/19/11 1220 132/114  mmHg - - 88  15  99 % - -  10/19/11 1203 179/60 mmHg - - 91  10  99 % - -  10/19/11 1149 - - - 95  22  94 % - -  10/19/11 1148 182/63 mmHg - - 96  24  97 % - -  10/19/11 1147 - 98 F (36.7 C) - - - - - -  10/19/11 0941 - - - 98  14  99 % - -  10/19/11 0917 - - - 94  16  99 % - -  10/19/11 0755 146/74 mmHg 97.9 F (36.6 C) Oral 82  16  96 % - -     General:  Alert oriented x3, obese  Eyes: Pupil equal and round react to light accommodation  ENT: No pharyngeal exudate  Neck: No JVD no thyromegaly  Cardiovascular: Regular rate and rhythm without murmurs rubs or gallops  Respiratory: Clear to auscultation anterior fields, patient cannot be seated due to her recent surgery  Abdomen: Soft nontender  Skin: Pale and dry  Musculoskeletal: Status post right total knee replacement  Psychiatric: Euthymic  Neurologic: Cranial nerves 2-12 intact, sensation intact, strength intact  Labs on Admission:  Basic Metabolic Panel: No results found for this basename: NA:5,K:5,CL:5,CO2:5,GLUCOSE:5,BUN:5,CREATININE:5,CALCIUM:5,MG:5,PHOS:5 in the last 168 hours Liver Function Tests: No results found for this basename: AST:5,ALT:5,ALKPHOS:5,BILITOT:5,PROT:5,ALBUMIN:5 in the last 168 hours No results found for this basename: LIPASE:5,AMYLASE:5 in the last 168 hours No results found for this basename: AMMONIA:5 in the last 168 hours CBC: No results found for this basename: WBC:5,NEUTROABS:5,HGB:5,HCT:5,MCV:5,PLT:5 in the last 168 hours Cardiac Enzymes: No results found for this basename: CKTOTAL:5,CKMB:5,CKMBINDEX:5,TROPONINI:5 in the last 168 hours BNP: No components found with this basename: POCBNP:5 CBG:  Lab 10/19/11 1213 10/19/11 0805  GLUCAP 185* 183*    Radiological Exams on Admission: X-ray Knee Right Port  10/19/2011  *RADIOLOGY REPORT*  Clinical Data: Postop  PORTABLE RIGHT KNEE - 1-2 VIEW  Comparison: None.  Findings: The patient is status post right total knee replacement.  Femoral and tibial components appear well positioned.  Surgical drain good position.  IMPRESSION: As above.  Original Report Authenticated By: Elsie Stain, M.D.    EKG: Independently reviewed. No significant ST changes  Impression/Recommendations 1. Diabetes mellitus type 2-, will adjust the Lantus to 20 units at bedtime based on predicted basal needs dueto decrease by mouth intake. We will adjust the dose for tomorrow night based on the morning fasting CBG. I will decrease the amount of mealtime NovoLog to 10 units 3 times a day plus to calculate a sliding scale based on an insulin resistant profile.  2. Hypertension-resume home medications with Norvasc 5 mg twice a day and clonidine 0.1 mg twice a day as well as Aldactone 3. obstructive sleep apnea resume CPAP  I will followup again tomorrow. Please contact me if I can be of assistance in the meanwhile. Thank you for this consultation.  Lonia Blood, MD  [facility] Pager [phone]  If 7PM-7AM, please contact night-coverage [email or web address] Password [facility] 10/19/2011, 7:11 PM

## 2011-10-19 NOTE — Brief Op Note (Signed)
10/19/2011  12:56 PM  PATIENT:  Valerie Stevenson  66 y.o. female  PRE-OPERATIVE DIAGNOSIS:  DJD RIGHT KNEE  POST-OPERATIVE DIAGNOSIS:  DJD RIGHT KNEE  PROCEDURE:  Procedure(s) (LRB): TOTAL KNEE ARTHROPLASTY (Right)  SURGEON:  Surgeon(s) and Role:    * Loreta Ave, MD - Primary  PHYSICIAN ASSISTANT: Zonia Kief M    ANESTHESIA:   regional and general  EBL:  Total I/O In: 1300 [I.V.:1300] Out: 275 [Urine:200; Blood:75]  BLOOD ADMINISTERED:none   SPECIMEN:  No Specimen  DISPOSITION OF SPECIMEN:  N/A  COUNTS:  YES  TOURNIQUET:   Total Tourniquet Time Documented: Thigh (Right) - 64 minutes  PATIENT DISPOSITION:  PACU - hemodynamically stable.

## 2011-10-19 NOTE — Progress Notes (Signed)
Blood pressure 175/59. Notified Dr. Lavera Guise. MD gave verbal order to give clonidine 0.1mg  now instead of 2200. Will continue to monitor patient.

## 2011-10-19 NOTE — Progress Notes (Signed)
UR COMPLETED  

## 2011-10-19 NOTE — Transfer of Care (Signed)
Immediate Anesthesia Transfer of Care Note  Patient: Valerie Stevenson  Procedure(s) Performed: Procedure(s) (LRB): TOTAL KNEE ARTHROPLASTY (Right)  Patient Location: PACU  Anesthesia Type: General  Level of Consciousness: awake and alert   Airway & Oxygen Therapy: Patient Spontanous Breathing and Patient connected to nasal cannula oxygen  Post-op Assessment: Report given to PACU RN and Post -op Vital signs reviewed and stable  Post vital signs: Reviewed and stable  Complications: No apparent anesthesia complications

## 2011-10-19 NOTE — Anesthesia Procedure Notes (Signed)
Anesthesia Regional Block:  Femoral nerve block  Pre-Anesthetic Checklist: ,, timeout performed, Correct Patient, Correct Site, Correct Laterality, Correct Procedure, Correct Position, site marked, Risks and benefits discussed,  Surgical consent,  Pre-op evaluation,  At surgeon's request and post-op pain management  Laterality: Right and Lower  Prep: chloraprep       Needles:  Injection technique: Single-shot  Needle Type: Stimiplex     Needle Length:cm 9 cm Needle Gauge: 21 G    Additional Needles:  Procedures: ultrasound guided and nerve stimulator Femoral nerve block  Nerve Stimulator or Paresthesia:  Response: quadriceps, 0.5 mA,   Additional Responses:   Narrative:  Start time: 10/19/2011 9:24 AM  Performed by: Personally  Anesthesiologist: Rhyder Koegel  Additional Notes: No pain on injection. No increased resistance to injection. Motor intact immediately after block. Loss of quadriceps strength at 20 minutes.

## 2011-10-19 NOTE — Anesthesia Postprocedure Evaluation (Signed)
  Anesthesia Post-op Note  Patient: Valerie Stevenson  Procedure(s) Performed: Procedure(s) (LRB): TOTAL KNEE ARTHROPLASTY (Right)  Patient Location: PACU  Anesthesia Type: GA combined with regional for post-op pain  Level of Consciousness: awake and alert   Airway and Oxygen Therapy: Patient Spontanous Breathing  Post-op Pain: mild  Post-op Assessment: Post-op Vital signs reviewed, Patient's Cardiovascular Status Stable, Respiratory Function Stable, Patent Airway and No signs of Nausea or vomiting  Post-op Vital Signs: stable  Complications: No apparent anesthesia complications

## 2011-10-20 ENCOUNTER — Encounter (HOSPITAL_COMMUNITY): Payer: Self-pay | Admitting: Orthopedic Surgery

## 2011-10-20 DIAGNOSIS — E1165 Type 2 diabetes mellitus with hyperglycemia: Secondary | ICD-10-CM

## 2011-10-20 DIAGNOSIS — I10 Essential (primary) hypertension: Secondary | ICD-10-CM

## 2011-10-20 DIAGNOSIS — IMO0001 Reserved for inherently not codable concepts without codable children: Secondary | ICD-10-CM

## 2011-10-20 DIAGNOSIS — Z96659 Presence of unspecified artificial knee joint: Secondary | ICD-10-CM

## 2011-10-20 LAB — CBC
MCHC: 32.4 g/dL (ref 30.0–36.0)
MCV: 94 fL (ref 78.0–100.0)
Platelets: 180 10*3/uL (ref 150–400)
RDW: 13.6 % (ref 11.5–15.5)
WBC: 10.3 10*3/uL (ref 4.0–10.5)

## 2011-10-20 LAB — PROTIME-INR: INR: 1.12 (ref 0.00–1.49)

## 2011-10-20 LAB — BASIC METABOLIC PANEL
Chloride: 99 mEq/L (ref 96–112)
Creatinine, Ser: 1.6 mg/dL — ABNORMAL HIGH (ref 0.50–1.10)
GFR calc Af Amer: 38 mL/min — ABNORMAL LOW (ref >90)
GFR calc non Af Amer: 33 mL/min — ABNORMAL LOW (ref >90)
Potassium: 5 mEq/L (ref 3.5–5.1)

## 2011-10-20 LAB — GLUCOSE, CAPILLARY: Glucose-Capillary: 244 mg/dL — ABNORMAL HIGH (ref 70–99)

## 2011-10-20 MED ORDER — HYDROCODONE-ACETAMINOPHEN 5-325 MG PO TABS
1.0000 | ORAL_TABLET | ORAL | Status: DC | PRN
  Administered 2011-10-20 – 2011-10-21 (×6): 2 via ORAL
  Administered 2011-10-22: 1 via ORAL
  Administered 2011-10-22: 2 via ORAL
  Filled 2011-10-20 (×9): qty 2

## 2011-10-20 MED ORDER — INSULIN ASPART 100 UNIT/ML ~~LOC~~ SOLN
15.0000 [IU] | Freq: Three times a day (TID) | SUBCUTANEOUS | Status: DC
  Administered 2011-10-21 (×2): 15 [IU] via SUBCUTANEOUS

## 2011-10-20 MED ORDER — INSULIN GLARGINE 100 UNIT/ML ~~LOC~~ SOLN
30.0000 [IU] | Freq: Every day | SUBCUTANEOUS | Status: DC
  Administered 2011-10-20: 30 [IU] via SUBCUTANEOUS

## 2011-10-20 MED ORDER — WARFARIN SODIUM 7.5 MG PO TABS
7.5000 mg | ORAL_TABLET | Freq: Once | ORAL | Status: AC
  Administered 2011-10-20: 7.5 mg via ORAL
  Filled 2011-10-20: qty 1

## 2011-10-20 NOTE — Progress Notes (Signed)
Pt wore CPM machine for a total of 2 and half hours. Refused to keep it on and stated she wanted if off now.  I explained to benefits of the machine and why we use them. With her verbally understanding, she refused to keep it on. Will attempt later. Cont. To monitor pt.

## 2011-10-20 NOTE — Progress Notes (Signed)
Coumadin for Right TKA 10/19/11 Coumadin score = 3  Anticoag: s/p right TKA. Goal INR 2-3; INR today 1.12.  Also on lovenox 30mg  sq q12h; CBC stable  Plan: 1) Coumadin 7.5mg  po x1; daily PT/INR 2) Continue lovenox until INR > /= 1.8

## 2011-10-20 NOTE — Progress Notes (Signed)
[facility] PROGRESS NOTE  Valerie Stevenson WUJ:[id number] DOB: 22-Feb-1946 DOA: 10/19/2011 PCP: Marga Melnick, MD  Assessment/Plan: 1. DM type 1 uncontrolled - increase Lantus and Novolog with meals . Patient still with poor po intake so we will be very careful not to cause hypoglycemia . lantus 30 QHS, Novolog 15 TID 2. HTN - no change in meds . Stop iv fluids  Active Problems:  HYPERTHYROIDISM  DEPRESSION  HYPERTENSION  ASTHMA  GERD  DIVERTICULOSIS, COLON  Type I (juvenile type) diabetes mellitus with renal manifestations, uncontrolled  Leyan Branden, MD  [facility] Pager [phone]  If 7PM-7AM, please contact night-coverage [email or web address] Password [facility] 10/20/2011, 6:57 PM   LOS: 1 day    HPI/Subjective: C/o knee pain   Objective: Filed Vitals:   10/20/11 0800 10/20/11 1153 10/20/11 1425 10/20/11 1600  BP:   186/52   Pulse:   101   Temp:   98.2 F (36.8 C)   TempSrc:      Resp: 22 20 20 18   Height:      Weight:      SpO2: 96% 96% 100% 98%    Intake/Output Summary (Last 24 hours) at 10/20/11 1857 Last data filed at 10/20/11 1300  Gross per 24 hour  Intake    480 ml  Output   1050 ml  Net   -570 ml    Exam:   General:  Lethargic after ativan  Cardiovascular: RRR  Respiratory: CTAB  Abdomen: soft, Nt  Data Reviewed: Basic Metabolic Panel:  Lab 10/20/11 8657  NA 131*  K 5.0  CL 99  CO2 23  GLUCOSE 215*  BUN 26*  CREATININE 1.60*  CALCIUM 8.8  MG --  PHOS --   Liver Function Tests: No results found for this basename: AST:5,ALT:5,ALKPHOS:5,BILITOT:5,PROT:5,ALBUMIN:5 in the last 168 hours No results found for this basename: LIPASE:5,AMYLASE:5 in the last 168 hours No results found for this basename: AMMONIA:5 in the last 168 hours CBC:  Lab 10/20/11 0540  WBC 10.3  NEUTROABS --  HGB 10.7*  HCT 33.0*  MCV 94.0  PLT 180   Cardiac Enzymes: No results found for this basename: CKTOTAL:5,CKMB:5,CKMBINDEX:5,TROPONINI:5  in the last 168 hours BNP (last 3 results) No results found for this basename: PROBNP:3 in the last 8760 hours CBG:  Lab 10/20/11 1627 10/20/11 1048 10/20/11 0659 10/19/11 2124 10/19/11 1213  GLUCAP 269* 237* 186* 271* 185*    Recent Results (from the past 240 hour(s))  SURGICAL PCR SCREEN     Status: Normal   Collection Time   10/13/11 11:26 AM      Component Value Range Status Comment   MRSA, PCR NEGATIVE  NEGATIVE Final    Staphylococcus aureus NEGATIVE  NEGATIVE Final      Studies: X-ray Knee Right Port  10/19/2011  *RADIOLOGY REPORT*  Clinical Data: Postop  PORTABLE RIGHT KNEE - 1-2 VIEW  Comparison: None.  Findings: The patient is status post right total knee replacement. Femoral and tibial components appear well positioned.  Surgical drain good position.  IMPRESSION: As above.  Original Report Authenticated By: Elsie Stain, M.D.    Scheduled Meds:    . amLODipine  5 mg Oral BID  . buPROPion  150 mg Oral Daily  . cloNIDine  0.1 mg Oral BID  . diphenhydrAMINE      . docusate sodium  100 mg Oral BID  . enoxaparin  30 mg Subcutaneous Q12H  . famotidine  20 mg Oral Daily  . fentaNYL      .  HYDROmorphone      . HYDROmorphone      . insulin aspart  0-20 Units Subcutaneous TID WC  . insulin aspart  0-5 Units Subcutaneous QHS  . insulin aspart  15 Units Subcutaneous TID WC  . insulin glargine  30 Units Subcutaneous QHS  . loratadine  10 mg Oral Daily  . patient's guide to using coumadin book   Does not apply Once  . simvastatin  20 mg Oral q1800  . spironolactone  50 mg Oral Daily  . vancomycin  1,000 mg Intravenous Q12H  . warfarin  7.5 mg Oral ONCE-1800  . warfarin   Does not apply Once  . Warfarin - Pharmacist Dosing Inpatient   Does not apply q1800  . DISCONTD: insulin aspart  10 Units Subcutaneous TID WC  . DISCONTD: insulin glargine  20 Units Subcutaneous QHS   Continuous Infusions:    . DISCONTD: 0.9 % NaCl with KCl 20 mEq / L 1,000 mL (10/20/11 1428)  .  DISCONTD: lactated ringers 50 mL/hr at 10/19/11 0920

## 2011-10-20 NOTE — Progress Notes (Signed)
OT Cancellation Note  Treatment cancelled today due to Pt asking for more pain meds however pt drifting off when nurse came in to give it to her so nurse did not give her the pain meds. Applied ice to patient's knee and set her up for lunch. Pt was not able to do alot with PT earlier due to lethargy. Will re-attempt eval tomorrow.Valerie Stevenson [phone] 10/20/2011, 12:23 PM

## 2011-10-20 NOTE — Progress Notes (Signed)
Subjective: Drowsy.  Some nausea last night.     Objective: Vital signs in last 24 hours: Temp:  [97.6 F (36.4 C)-98.4 F (36.9 C)] 98.2 F (36.8 C) (06/27 0706) Pulse Rate:  [79-96] 87  (06/27 0706) Resp:  [9-24] 22  (06/27 0800) BP: (132-183)/(48-114) 155/65 mmHg (06/27 0706) SpO2:  [93 %-100 %] 96 % (06/27 0800) Weight:  [121.3 kg (267 lb 6.7 oz)] 121.3 kg (267 lb 6.7 oz) (06/26 1500)  Intake/Output from previous day: 06/26 0701 - 06/27 0700 In: 1300 [I.V.:1300] Out: 775 [Urine:700; Blood:75] Intake/Output this shift: Total I/O In: -  Out: 50 [Drains:50]   Basename 10/20/11 0540  HGB 10.7*    Basename 10/20/11 0540  WBC 10.3  RBC 3.51*  HCT 33.0*  PLT 180    Basename 10/20/11 0540  NA 131*  K 5.0  CL 99  CO2 23  BUN 26*  CREATININE 1.60*  GLUCOSE 215*  CALCIUM 8.8    Basename 10/20/11 0540  LABPT --  INR 1.12    Exam:  Dressing c/d/i.  Calf nt, nvi.    Assessment/Plan: Patient states original plan was to d/c home.  i discussed possible short snf placement.   Will see how she does with therapy today. D/c dilaudid.  Try norco for pain.  D/c foley.  **appreciate medicine assistance   Pike Scantlebury M 10/20/2011, 10:17 AM

## 2011-10-20 NOTE — Evaluation (Signed)
Physical Therapy Evaluation Patient Details Name: Valerie Stevenson MRN: [id number] DOB: [birth year] Today's Date: 10/20/2011 Time: 0935-1000 PT Time Calculation (min): 25 min  PT Assessment / Plan / Recommendation Clinical Impression  Pt is a 66 y/o female admitted s/p right TKA along with the below PT problem list.  Pt very limited this am by lethargy due to pain medication.  Pt would benefit from acute PT to maximize independence and facilitate d/c to SNF (versus home with HHPT pending progress).    PT Assessment  Patient needs continued PT services    Follow Up Recommendations  Skilled nursing facility    Barriers to Discharge Decreased caregiver support Question ability of spouse to help at d/c.  Unable to determine due to pt lethargy.    Equipment Recommendations  Defer to next venue    Recommendations for Other Services     Frequency 7X/week    Precautions / Restrictions Precautions Precautions: Knee Precaution Booklet Issued: No Required Braces or Orthoses: Knee Immobilizer - Right Knee Immobilizer - Right: On except when in CPM Restrictions Weight Bearing Restrictions: Yes RLE Weight Bearing: Weight bearing as tolerated   Pertinent Vitals/Pain 7/10 in right knee with activity.  Pt repositioned.      Mobility  Bed Mobility Bed Mobility: Supine to Sit Supine to Sit: 1: +2 Total assist;HOB flat Supine to Sit: Patient Percentage: 50% Details for Bed Mobility Assistance: Assist for right LE as well as trunk to translate to EOB due to pain and lethargy.  Max cues for sequence and arousal. Transfers Transfers: Sit to Stand;Stand to Sit Sit to Stand: 1: +2 Total assist;With upper extremity assist;From bed Sit to Stand: Patient Percentage: 50% Stand to Sit: 1: +2 Total assist;With upper extremity assist;To chair/3-in-1 Stand to Sit: Patient Percentage: 60% Details for Transfer Assistance: Assist to translate trunk anterior and slow descent to chair with max cues for  hand/right LE placement.   Ambulation/Gait Ambulation/Gait Assistance: 1: +2 Total assist Ambulation/Gait: Patient Percentage: 60% Ambulation Distance (Feet): 5 Feet Assistive device: Rolling walker Ambulation/Gait Assistance Details: Assist for balance and to off weight right LE due to pain.  Max cues for sequence with distance limited by lethargy. Gait Pattern: Step-to pattern;Decreased step length - right;Decreased stance time - right Stairs: No Wheelchair Mobility Wheelchair Mobility: No    Exercises Total Joint Exercises Ankle Circles/Pumps: AAROM;Right;10 reps;Supine Quad Sets: AROM;Right;10 reps;Supine Heel Slides: AAROM;Right;10 reps;Supine   PT Diagnosis: Difficulty walking;Acute pain  PT Problem List: Decreased strength;Decreased range of motion;Decreased activity tolerance;Decreased balance;Decreased mobility;Decreased knowledge of use of DME;Decreased knowledge of precautions;Pain PT Treatment Interventions: DME instruction;Gait training;Functional mobility training;Therapeutic activities;Therapeutic exercise;Balance training;Patient/family education   PT Goals Acute Rehab PT Goals PT Goal Formulation: With patient Time For Goal Achievement: 11/03/11 Potential to Achieve Goals: Good Pt will go Supine/Side to Sit: with supervision PT Goal: Supine/Side to Sit - Progress: Goal set today Pt will go Sit to Supine/Side: with supervision PT Goal: Sit to Supine/Side - Progress: Goal set today Pt will go Sit to Stand: with supervision PT Goal: Sit to Stand - Progress: Goal set today Pt will go Stand to Sit: with supervision PT Goal: Stand to Sit - Progress: Goal set today Pt will Ambulate: 51 - 150 feet;with supervision;with least restrictive assistive device PT Goal: Ambulate - Progress: Goal set today Pt will Perform Home Exercise Program: with supervision, verbal cues required/provided PT Goal: Perform Home Exercise Program - Progress: Goal set today  Visit Information   Last PT Received On: 10/20/11  Assistance Needed: +2    Subjective Data  Subjective: "I'm really sleepy." Patient Stated Goal: Go home.   Prior Functioning  Home Living Lives With: Spouse Available Help at Discharge: Family Type of Home: House Home Access: Level entry Home Layout: One level Home Adaptive Equipment: Walker - rolling Prior Function Level of Independence: Independent Communication Communication: No difficulties    Cognition  Overall Cognitive Status: Impaired Area of Impairment: Attention Arousal/Alertness: Lethargic Orientation Level: Appears intact for tasks assessed Behavior During Session: Lethargic Current Attention Level: Focused (Due to pain medication.)    Extremity/Trunk Assessment Right Upper Extremity Assessment RUE ROM/Strength/Tone: Within functional levels RUE Sensation: WFL - Light Touch RUE Coordination: WFL - gross/fine motor Left Upper Extremity Assessment LUE ROM/Strength/Tone: Within functional levels LUE Sensation: WFL - Light Touch LUE Coordination: WFL - gross/fine motor Right Lower Extremity Assessment RLE ROM/Strength/Tone: Deficits RLE ROM/Strength/Tone Deficits: 2/5 throughout.  AA/ROM right knee 0-60 degrees. RLE Sensation: WFL - Light Touch RLE Coordination: WFL - gross/fine motor Left Lower Extremity Assessment LLE ROM/Strength/Tone: Within functional levels LLE Sensation: WFL - Light Touch LLE Coordination: WFL - gross/fine motor Trunk Assessment Trunk Assessment: Normal   Balance Balance Balance Assessed: No  End of Session PT - End of Session Equipment Utilized During Treatment: Gait belt;Right knee immobilizer Activity Tolerance: Patient tolerated treatment well;Treatment limited secondary to medication Patient left: in chair;with call bell/phone within reach Nurse Communication: Mobility status CPM Right Knee CPM Right Knee: Off  GP     Cephus Shelling 10/20/2011, 11:05 AM  10/20/2011 Cephus Shelling, PT,  DPT [phone]

## 2011-10-20 NOTE — Progress Notes (Signed)
CARE MANAGEMENT NOTE 10/20/2011  Patient:  Valerie Stevenson,Valerie Stevenson   Account Number:  [id number]  Date Initiated:  10/20/2011  Documentation initiated by:  Vance Peper  Subjective/Objective Assessment:   66 yr old female s/p right total knee arthroplasty     Action/Plan:   CM spoke with patient regarding Home Health. patient preoperatively setup with [facility], no changes. Social worker to see patient, she may need shortterm rehab. Will follow.   Anticipated DC Date:     Anticipated DC Plan:           Choice offered to / List presented to:             Status of service:  In process, will continue to follow

## 2011-10-20 NOTE — Progress Notes (Signed)
Physical Therapy Treatment Patient Details Name: Valerie Stevenson MRN: [id number] DOB: [birth year] Today's Date: 10/20/2011 Time: 3086-5784 PT Time Calculation (min): 25 min  PT Assessment / Plan / Recommendation Comments on Treatment Session  Pt admitted s/p right TKA and was ability to tolerate BID ambulation today although distance greatly limited by pain.  Pt much more awake/alert during this treatment.  Would benefit from d/c to SNF to continue to increase independence and safety prior to d/c home.    Follow Up Recommendations  Skilled nursing facility    Barriers to Discharge Decreased caregiver support Question ability of spouse to help at d/c.  Unable to determine due to pt lethargy.    Equipment Recommendations  Defer to next venue    Recommendations for Other Services    Frequency 7X/week   Plan Discharge plan remains appropriate;Frequency remains appropriate    Precautions / Restrictions Precautions Precautions: Knee Precaution Booklet Issued: No Required Braces or Orthoses: Knee Immobilizer - Right Knee Immobilizer - Right: On except when in CPM Restrictions Weight Bearing Restrictions: Yes RLE Weight Bearing: Weight bearing as tolerated   Pertinent Vitals/Pain 8/10 in right knee.  Pt repositioned and RN made aware.    Mobility  Bed Mobility Bed Mobility: Sit to Supine Supine to Sit: 1: +2 Total assist;HOB flat Supine to Sit: Patient Percentage: 50% Sit to Supine: 1: +2 Total assist;HOB flat Sit to Supine: Patient Percentage: 60% Details for Bed Mobility Assistance: Assist to bilateral LEs to facilitate motion onto bed as well as trunk to slow descent to surface.  Max cues for sequence. Transfers Transfers: Sit to Stand;Stand to Sit Sit to Stand: 1: +2 Total assist;With upper extremity assist;From chair/3-in-1 Sit to Stand: Patient Percentage: 60% Stand to Sit: 1: +2 Total assist;With upper extremity assist;To bed Stand to Sit: Patient Percentage: 70% Details  for Transfer Assistance: Assist to translate trunk anterior over BOS and slow descent to bed.  Cues for hand/right LE placement. Ambulation/Gait Ambulation/Gait Assistance: 1: +2 Total assist Ambulation/Gait: Patient Percentage: 70% Ambulation Distance (Feet): 8 Feet Assistive device: Rolling walker Ambulation/Gait Assistance Details: Assist for balance and to off weight right LE due to pain.  Max cues for safe sequence with pt very distracted by pain.  Distance limited by pain. Gait Pattern: Step-to pattern;Decreased step length - right;Decreased stance time - right Stairs: No Wheelchair Mobility Wheelchair Mobility: No    Exercises Total Joint Exercises Ankle Circles/Pumps: AROM;Right;10 reps;Seated Quad Sets: AROM;Right;10 reps;Supine Heel Slides: AAROM;Right;10 reps;Supine   PT Diagnosis: Difficulty walking;Acute pain  PT Problem List: Decreased strength;Decreased range of motion;Decreased activity tolerance;Decreased balance;Decreased mobility;Decreased knowledge of use of DME;Decreased knowledge of precautions;Pain PT Treatment Interventions: DME instruction;Gait training;Functional mobility training;Therapeutic activities;Therapeutic exercise;Balance training;Patient/family education   PT Goals Acute Rehab PT Goals PT Goal Formulation: With patient Time For Goal Achievement: 11/03/11 Potential to Achieve Goals: Good Pt will go Supine/Side to Sit: with supervision PT Goal: Supine/Side to Sit - Progress: Goal set today Pt will go Sit to Supine/Side: with supervision PT Goal: Sit to Supine/Side - Progress: Progressing toward goal Pt will go Sit to Stand: with supervision PT Goal: Sit to Stand - Progress: Progressing toward goal Pt will go Stand to Sit: with supervision PT Goal: Stand to Sit - Progress: Progressing toward goal Pt will Ambulate: 51 - 150 feet;with supervision;with least restrictive assistive device PT Goal: Ambulate - Progress: Progressing toward goal Pt will  Perform Home Exercise Program: with supervision, verbal cues required/provided PT Goal: Perform Home Exercise Program -  Progress: Progressing toward goal  Visit Information  Last PT Received On: 10/20/11 Assistance Needed: +2    Subjective Data  Subjective: "I need to get back to bed." Patient Stated Goal: Go home.   Cognition  Overall Cognitive Status: Appears within functional limits for tasks assessed/performed Area of Impairment: Attention Arousal/Alertness: Awake/alert Orientation Level: Appears intact for tasks assessed Behavior During Session: [facility] for tasks performed Current Attention Level: Focused (Due to pain medication.)    Balance  Balance Balance Assessed: No  End of Session PT - End of Session Equipment Utilized During Treatment: Gait belt;Right knee immobilizer Activity Tolerance: Patient tolerated treatment well Patient left: in bed;in CPM;with call bell/phone within reach;with family/visitor present (CPM 0-50 degrees.  CNA aware.) Nurse Communication: Mobility status;Patient requests pain meds CPM Right Knee CPM Right Knee: Off   GP     Cephus Shelling 10/20/2011, 1:21 PM  10/20/2011 Cephus Shelling, PT, DPT [phone]

## 2011-10-21 DIAGNOSIS — Z96659 Presence of unspecified artificial knee joint: Secondary | ICD-10-CM

## 2011-10-21 DIAGNOSIS — E1165 Type 2 diabetes mellitus with hyperglycemia: Secondary | ICD-10-CM

## 2011-10-21 DIAGNOSIS — I10 Essential (primary) hypertension: Secondary | ICD-10-CM

## 2011-10-21 DIAGNOSIS — IMO0001 Reserved for inherently not codable concepts without codable children: Secondary | ICD-10-CM

## 2011-10-21 LAB — CBC
Platelets: 169 10*3/uL (ref 150–400)
RBC: 3.48 MIL/uL — ABNORMAL LOW (ref 3.87–5.11)
WBC: 12.6 10*3/uL — ABNORMAL HIGH (ref 4.0–10.5)

## 2011-10-21 LAB — PROTIME-INR
INR: 1.16 (ref 0.00–1.49)
Prothrombin Time: 15 seconds (ref 11.6–15.2)

## 2011-10-21 LAB — BASIC METABOLIC PANEL
CO2: 26 mEq/L (ref 19–32)
Calcium: 9.2 mg/dL (ref 8.4–10.5)
Chloride: 97 mEq/L (ref 96–112)
Potassium: 4.8 mEq/L (ref 3.5–5.1)
Sodium: 130 mEq/L — ABNORMAL LOW (ref 135–145)

## 2011-10-21 LAB — GLUCOSE, CAPILLARY
Glucose-Capillary: 250 mg/dL — ABNORMAL HIGH (ref 70–99)
Glucose-Capillary: 291 mg/dL — ABNORMAL HIGH (ref 70–99)
Glucose-Capillary: 235 mg/dL — ABNORMAL HIGH (ref 70–99)
Glucose-Capillary: 152 mg/dL — ABNORMAL HIGH (ref 70–99)

## 2011-10-21 MED ORDER — WARFARIN SODIUM 7.5 MG PO TABS
7.5000 mg | ORAL_TABLET | Freq: Once | ORAL | Status: AC
  Administered 2011-10-21: 7.5 mg via ORAL
  Filled 2011-10-21: qty 1

## 2011-10-21 MED ORDER — WARFARIN SODIUM 5 MG PO TABS: 5.0000 mg | ORAL_TABLET | Freq: Every day | ORAL | Status: DC

## 2011-10-21 MED ORDER — ENOXAPARIN SODIUM 30 MG/0.3ML ~~LOC~~ SOLN: 30.0000 mg | Freq: Two times a day (BID) | SUBCUTANEOUS | Status: DC

## 2011-10-21 MED ORDER — INSULIN ASPART 100 UNIT/ML ~~LOC~~ SOLN
25.0000 [IU] | Freq: Three times a day (TID) | SUBCUTANEOUS | Status: DC
  Administered 2011-10-21 – 2011-10-22 (×3): 25 [IU] via SUBCUTANEOUS

## 2011-10-21 MED ORDER — HYDROCODONE-ACETAMINOPHEN 5-325 MG PO TABS: 1.0000 | ORAL_TABLET | ORAL | Status: AC | PRN

## 2011-10-21 MED ORDER — INSULIN GLARGINE 100 UNIT/ML ~~LOC~~ SOLN
40.0000 [IU] | Freq: Every day | SUBCUTANEOUS | Status: DC
  Administered 2011-10-21: 40 [IU] via SUBCUTANEOUS

## 2011-10-21 MED ORDER — METHOCARBAMOL 500 MG PO TABS: 500.0000 mg | ORAL_TABLET | Freq: Four times a day (QID) | ORAL | Status: AC

## 2011-10-21 NOTE — Discharge Summary (Signed)
Valerie Stevenson, Valerie Stevenson NO.:  [id number]  MEDICAL RECORD NO.:  [id number]  LOCATION:  [id number]                        FACILITY:  [facility]  PHYSICIAN:  Loreta Ave, M.D. DATE OF BIRTH:  [birth year]  DATE OF ADMISSION:  10/19/2011 DATE OF DISCHARGE:                              DISCHARGE SUMMARY   FINAL DIAGNOSES: 1. Status post right total knee replacement for end-stage degenerative     joint disease. 2. Diabetes. 3. Hypertension. 4. Depression. 5. Hypothyroidism. 6. Hysterectomy. 7. Morbid obesity. 8. Toxic multinodular goiter. 9. Obstructive sleep apnea. 10.Raynaud disease. 11.Anemia. 12.Asthma/chronic obstructive pulmonary disease. 13.Gastroesophageal reflux disease. 14.Diverticulosis. 15.Idiopathic urticaria.  HISTORY OF PRESENT ILLNESS:  A 66 year old white female with history of end-stage DJD, right knee and chronic pain, who presented to our office for preop evaluation for total knee replacement.  She had progressively worsening pain with failed response with conservative treatment. Significant decrease in her daily activities due to the ongoing complaint.  Received preop clearances from primary care physician, Dr. Alwyn Ren; endocrinologist, Dr. Everardo All; and pulmonologist, Dr. Jetty Duhamel.  HOSPITAL COURSE:  On October 19, 2011, the patient was taken to the [facility] OR and a right total knee replacement procedure was performed.  SURGEON:  Loreta Ave, M.D.  ASSISTANT:  Genene Churn. Denton Meek.  ANESTHESIA:  General with femoral nerve block.  ESTIMATED BLOOD LOSS:  Minimal.  SPECIMENS AND CULTURES:  None.  TOURNIQUET TIME:  64 minutes.  COUNTS:  All counts correct.  COMPLICATIONS:  There were no surgical or anesthesia complications, and the patient was transferred to recovery in stable condition.  After a surgery, I did ask the [facility] hospitalist to follow along to help manage medical issues, and they agreed.  After arriving to  the Orthopedic Unit, pharmacy protocol Coumadin, Lovenox started for DVT prophylaxis.  On October 20, 2011, the patient appears drowsy, using Dilaudid IV for pain, some nausea.  Labs; hemoglobin 10.7, hematocrit 33.0, INR 1.12.  Dressing clean, dry, intact.  Calf nontender. Neurovascular intact.  Skin warm and dry.  Discontinued Dilaudid IV and ordered Norco 5/325 for pain.  Discontinued Foley.  Discussed possible short skilled nursing facility placement for rehab, but the patient stated she would rather go home.  I told her that we will see how she does with therapy.  On October 21, 2011, the patient looks much better, less drowsy, more alert.  Good pain control.  She made slow progress with therapy and now she agrees to short skilled nursing facility placement for rehab at [facility].  Hemoglobin 10.5, hematocrit 32.1, creatinine 1.40, glucose 247.  INR 1.16.  Right knee wound looks good. Staples intact.  No drainage signs or infection.  Calf nontender. Neurovascular intact.  Skin warm and dry.  We will begin paperwork for transfer to [facility] Saturday.  DISPOSITION:  Transfer to [facility].  CONDITION:  Stable.  DISCHARGE MEDICATIONS:  See detailed AVS in chart.  INSTRUCTIONS:  While at the facility, the patient will continue work with PT/OT to improve ambulation and knee range of motion and strengthening.  Weightbear as tolerated with walker and can progress to a single prong cane as tolerated.  Okay to  shower, but no tub soaking. Do not apply any creams or ointments to her incision.  Daily dressing changes with 4 x 4 gauze and apply TED hose over this.  Coumadin x3-4 weeks postop for DVT prophylaxis and discontinue Lovenox when Coumadin is therapeutic with INR 2-3.  CPM 0-70 degrees 6-8 hours daily and increase by 10 degrees daily as tolerated.  Follow up visit with Dr. Eulah Pont 2 weeks postop for recheck and we will remove staples at that time.  Call our office immediately if  there are any questions or concerns.     Genene Churn. Denton Meek.   ______________________________ Loreta Ave, M.D.    JMO/MEDQ  D:  10/21/2011  T:  10/21/2011  Job:  [id number]

## 2011-10-21 NOTE — Progress Notes (Signed)
Patient ID: Valerie Stevenson, female   DOB: [birth year], 66 y.o.   MRN: [id number] Priority d/c summary # [phone]

## 2011-10-21 NOTE — Progress Notes (Signed)
Pt had CPM on x4 with a total of 2 hrs.  Pt would request CPM on, 20-64min later, would request it off.  PRN antianxiety administered, med effective.  Pt has rested quietly most of shift.  Will continue to observe.

## 2011-10-21 NOTE — Progress Notes (Signed)
Physical Therapy Treatment Patient Details Name: Valerie Stevenson MRN: [id number] DOB: 23-Apr-1946 Today's Date: 10/21/2011 Time: 8295-6213 PT Time Calculation (min): 29 min  PT Assessment / Plan / Recommendation Comments on Treatment Session  Pt admitted s/p right TKA and continues to progress.  Pt able to increase ambulation distance today as well as independence as well as complete therapeutic exercises.    Follow Up Recommendations  Skilled nursing facility    Barriers to Discharge        Equipment Recommendations  Defer to next venue    Recommendations for Other Services    Frequency 7X/week   Plan Discharge plan remains appropriate;Frequency remains appropriate    Precautions / Restrictions Precautions Precautions: Knee Precaution Booklet Issued: No Required Braces or Orthoses: Knee Immobilizer - Right Knee Immobilizer - Right: On except when in CPM Restrictions Weight Bearing Restrictions: Yes RLE Weight Bearing: Weight bearing as tolerated   Pertinent Vitals/Pain 5/10 in right knee.  Pt repositioned.    Mobility  Bed Mobility Bed Mobility: Not assessed Transfers Transfers: Sit to Stand;Stand to Sit (2 trials.) Sit to Stand: 3: Mod assist;With upper extremity assist;From chair/3-in-1 (Up to mod assist progressing to min assist.) Stand to Sit: 4: Min assist;With upper extremity assist;To chair/3-in-1 Details for Transfer Assistance: Assist to translate trunk anterior over BOS with cues for safest hand/right LE placement as well as safety with sequence. Ambulation/Gait Ambulation/Gait Assistance: 4: Min assist Ambulation Distance (Feet): 40 Feet (15 feet and then 25 feet.) Assistive device: Rolling walker Ambulation/Gait Assistance Details: Assist for balance and to off weight right LE due to pain with cues for tall posture and sequencing during gait. Gait Pattern: Step-to pattern;Decreased step length - right;Decreased stance time - right Stairs: No Wheelchair  Mobility Wheelchair Mobility: No    Exercises Total Joint Exercises Ankle Circles/Pumps: AROM;Right;10 reps;Seated Quad Sets: AROM;Right;10 reps;Supine Heel Slides: AAROM;Right;10 reps;Seated Hip ABduction/ADduction: AAROM;Right;10 reps;Seated Straight Leg Raises: AAROM;Right;10 reps;Seated   PT Diagnosis:    PT Problem List:   PT Treatment Interventions:     PT Goals Acute Rehab PT Goals PT Goal Formulation: With patient Time For Goal Achievement: 11/03/11 Potential to Achieve Goals: Good PT Goal: Sit to Stand - Progress: Progressing toward goal PT Goal: Stand to Sit - Progress: Progressing toward goal PT Goal: Ambulate - Progress: Progressing toward goal PT Goal: Perform Home Exercise Program - Progress: Progressing toward goal  Visit Information  Last PT Received On: 10/21/11 Assistance Needed: +1    Subjective Data  Subjective: "Let's try." Patient Stated Goal: Go home.   Cognition  Overall Cognitive Status: Appears within functional limits for tasks assessed/performed Arousal/Alertness: Awake/alert Orientation Level: Appears intact for tasks assessed Behavior During Session: [facility] for tasks performed    Balance  Balance Balance Assessed: No  End of Session PT - End of Session Equipment Utilized During Treatment: Gait belt;Right knee immobilizer Activity Tolerance: Patient tolerated treatment well Patient left: in chair;with call bell/phone within reach Nurse Communication: Mobility status   GP     Cephus Shelling 10/21/2011, 12:34 PM  10/21/2011 Cephus Shelling, PT, DPT [phone]

## 2011-10-21 NOTE — Progress Notes (Signed)
[facility] PROGRESS NOTE  DULCE MARTIAN ZOX:[id number] DOB: 07/26/1945 DOA: 10/19/2011 PCP: Marga Melnick, MD  Assessment/Plan: 1. DM type 1 uncontrolled - we shall increase the Lantus to 40 units at bedtime to match the home dose. Increase NovoLog at mealtime to 20 units 3 times a day 2. HTN - no change in current medications  Active Problems:  HYPERTHYROIDISM  DEPRESSION  HYPERTENSION  ASTHMA  GERD  DIVERTICULOSIS, COLON  Type I (juvenile type) diabetes mellitus with renal manifestations, uncontrolled  Esteven Overfelt, MD  [facility] Pager [phone]  If 7PM-7AM, please contact night-coverage [email or web address] Password [facility] 10/21/2011, 8:39 AM   LOS: 2 days    HPI/Subjective: Right knee pain  Objective: Filed Vitals:   10/20/11 2319 10/21/11 0000 10/21/11 0400 10/21/11 0619  BP: 162/63   157/74  Pulse: 98   97  Temp: 98.7 F (37.1 C)   97.9 F (36.6 C)  TempSrc:      Resp: 20 14 14 16   Height:      Weight:      SpO2:  99% 98% 97%    Intake/Output Summary (Last 24 hours) at 10/21/11 0839 Last data filed at 10/21/11 8295  Gross per 24 hour  Intake    480 ml  Output   1050 ml  Net   -570 ml    Exam:  Heart regular the rhythm without murmurs rubs or gallops Lungs clear to auscultation bilateral Data Reviewed: Basic Metabolic Panel:  Lab 10/21/11 6213 10/20/11 0540  NA 130* 131*  K 4.8 5.0  CL 97 99  CO2 26 23  GLUCOSE 247* 215*  BUN 19 26*  CREATININE 1.40* 1.60*  CALCIUM 9.2 8.8  MG -- --  PHOS -- --   Liver Function Tests: No results found for this basename: AST:5,ALT:5,ALKPHOS:5,BILITOT:5,PROT:5,ALBUMIN:5 in the last 168 hours No results found for this basename: LIPASE:5,AMYLASE:5 in the last 168 hours No results found for this basename: AMMONIA:5 in the last 168 hours CBC:  Lab 10/21/11 0525 10/20/11 0540  WBC 12.6* 10.3  NEUTROABS -- --  HGB 10.5* 10.7*  HCT 32.1* 33.0*  MCV 92.2 94.0  PLT 169 180   Cardiac  Enzymes: No results found for this basename: CKTOTAL:5,CKMB:5,CKMBINDEX:5,TROPONINI:5 in the last 168 hours BNP (last 3 results) No results found for this basename: PROBNP:3 in the last 8760 hours CBG:  Lab 10/21/11 0651 10/20/11 2157 10/20/11 1627 10/20/11 1048 10/20/11 0659  GLUCAP 250* 244* 269* 237* 186*    Recent Results (from the past 240 hour(s))  SURGICAL PCR SCREEN     Status: Normal   Collection Time   10/13/11 11:26 AM      Component Value Range Status Comment   MRSA, PCR NEGATIVE  NEGATIVE Final    Staphylococcus aureus NEGATIVE  NEGATIVE Final      Studies: X-ray Knee Right Port  10/19/2011  *RADIOLOGY REPORT*  Clinical Data: Postop  PORTABLE RIGHT KNEE - 1-2 VIEW  Comparison: None.  Findings: The patient is status post right total knee replacement. Femoral and tibial components appear well positioned.  Surgical drain good position.  IMPRESSION: As above.  Original Report Authenticated By: Elsie Stain, M.D.    Scheduled Meds:    . amLODipine  5 mg Oral BID  . buPROPion  150 mg Oral Daily  . cloNIDine  0.1 mg Oral BID  . docusate sodium  100 mg Oral BID  . enoxaparin  30 mg Subcutaneous Q12H  . famotidine  20 mg Oral Daily  .  insulin aspart  0-20 Units Subcutaneous TID WC  . insulin aspart  0-5 Units Subcutaneous QHS  . insulin aspart  15 Units Subcutaneous TID WC  . insulin glargine  30 Units Subcutaneous QHS  . loratadine  10 mg Oral Daily  . simvastatin  20 mg Oral q1800  . spironolactone  50 mg Oral Daily  . vancomycin  1,000 mg Intravenous Q12H  . warfarin  7.5 mg Oral ONCE-1800  . warfarin   Does not apply Once  . Warfarin - Pharmacist Dosing Inpatient   Does not apply q1800  . DISCONTD: insulin aspart  10 Units Subcutaneous TID WC  . DISCONTD: insulin glargine  20 Units Subcutaneous QHS   Continuous Infusions:    . DISCONTD: 0.9 % NaCl with KCl 20 mEq / L 1,000 mL (10/20/11 1428)  . DISCONTD: lactated ringers 50 mL/hr at 10/19/11 0920

## 2011-10-21 NOTE — Progress Notes (Signed)
PT Treatment Note:   10/21/11 1235  PT Visit Information  Last PT Received On 10/21/11  Assistance Needed +1  PT Time Calculation  PT Start Time 1127  PT Stop Time 1144  PT Time Calculation (min) 17 min  Subjective Data  Subjective "I will do what I can do."  Patient Stated Goal Go home.  Precautions  Precautions Knee  Precaution Booklet Issued No  Required Braces or Orthoses Knee Immobilizer - Right  Knee Immobilizer - Right On except when in CPM  Restrictions  Weight Bearing Restrictions Yes  RLE Weight Bearing WBAT  Cognition  Overall Cognitive Status Appears within functional limits for tasks assessed/performed  Arousal/Alertness Awake/alert  Orientation Level Appears intact for tasks assessed  Behavior During Session [facility] for tasks performed  Bed Mobility  Bed Mobility Not assessed  Transfers  Transfers Sit to Stand;Stand to Sit  Sit to Stand 3: Mod assist;With upper extremity assist;From chair/3-in-1  Stand to Sit 4: Min assist;With upper extremity assist;To chair/3-in-1  Details for Transfer Assistance Assist still for balance and in translating trunk anterior while off weighting right LE due to pain.  Cues for safest hand/right LE placement.  Ambulation/Gait  Ambulation/Gait Assistance 4: Min assist  Ambulation Distance (Feet) 50 Feet  Assistive device Rolling walker  Ambulation/Gait Assistance Details Assist to off weight right LE due to pain with cues for tall posture and safe sequence inside RW.  Gait Pattern Step-to pattern;Decreased step length - right;Decreased stance time - right  Stairs No  Engineering geologist No  Balance  Balance Assessed No  PT - End of Session  Equipment Utilized During Treatment Gait belt;Right knee immobilizer  Activity Tolerance Patient tolerated treatment well  Patient left in chair;with call bell/phone within reach;with family/visitor present  Nurse Communication Mobility status  PT - Assessment/Plan    Comments on Treatment Session Pt admitted s/p right TKA and remains motivated.  Pt able to increase ambulation distance during second treatment.  PT Plan Discharge plan remains appropriate;Frequency remains appropriate  PT Frequency 7X/week  Follow Up Recommendations Skilled nursing facility  Equipment Recommended Defer to next venue  Acute Rehab PT Goals  PT Goal Formulation With patient  Time For Goal Achievement 11/03/11  Potential to Achieve Goals Good  PT Goal: Sit to Stand - Progress Progressing toward goal  PT Goal: Stand to Sit - Progress Progressing toward goal  PT Goal: Ambulate - Progress Progressing toward goal  PT General Charges  $$ ACUTE PT VISIT 1 Procedure  PT Treatments  $Gait Training 8-22 mins    Pain:  8/10 in right knee.  Pt repositioned.  10/21/2011 Cephus Shelling, PT, DPT [phone]

## 2011-10-21 NOTE — Clinical Social Work Placement (Addendum)
    Clinical Social Work Department CLINICAL SOCIAL WORK PLACEMENT NOTE 10/21/2011  Patient:  Wehrli,Valerie Stevenson  Account Number:  [id number] Admit date:  10/19/2011  Clinical Social Worker:  Lupita Leash Lamyia Cdebaca, BSW  Date/time:  10/21/2011 02:54 PM  Clinical Social Work is seeking post-discharge placement for this patient at the following level of care:   SKILLED NURSING   (*CSW will update this form in Epic as items are completed)     Patient/family provided with [facility] Department of Clinical Social Work's list of facilities offering this level of care within the geographic area requested by the patient (or if unable, by the patient's family).    Patient/family informed of their freedom to choose among providers that offer the needed level of care, that participate in Medicare, Medicaid or managed care program needed by the patient, have an available bed and are willing to accept the patient.    Patient/family informed of [facility]' ownership interest in [facility], as well as of the fact that they are under no obligation to receive care at this facility.  PASARR submitted to EDS on 10/21/2011 PASARR number received from EDS on 10/21/2011  FL2 transmitted to all facilities in geographic area requested by pt/family on  10/21/2011 FL2 transmitted to all facilities within larger geographic area on   Patient informed that his/her managed care company has contracts with or will negotiate with  certain facilities, including the following:   NA- Has Medicare     Patient/family informed of bed offers received:  10/21/2011 Patient chooses bed at [facility] Physician recommends and patient chooses bed at    Patient to be transferred to [facility] on  10/22/2011  (JB) Patient to be transferred to facility by Darnell Level)  The following physician request were entered in Epic:   Additional Comments:

## 2011-10-21 NOTE — Progress Notes (Signed)
Checking on patient today for evaluation. Noted to D/C to [facility] tomorrow (10/22/2011). Will defer OT eval to that facility. Acute OT will sign off.  Ignacia Palma, [place] [phone] 10/21/2011

## 2011-10-21 NOTE — Progress Notes (Signed)
ANTICOAGULATION CONSULT NOTE - Follow Up Consult  Pharmacy Consult for Coumadin Indication: VTE prophylaxis s/o right TKA  Allergies  Allergen Reactions  . Latex     rash  . Penicillins     REACTION: Hives  . Shellfish-Derived Products     Anaphylactoid reaction   . Morphine And Related Itching    Patient Measurements: Height: 5' 5.5" (166.4 cm) Weight: 267 lb 6.7 oz (121.3 kg) IBW/kg (Calculated) : 58.15  Heparin Dosing Weight:   Vital Signs: Temp: 97.9 F (36.6 C) (06/28 0619) BP: 157/74 mmHg (06/28 0619) Pulse Rate: 97  (06/28 0619)  Labs:  Basename 10/21/11 0525 10/20/11 0540  HGB 10.5* 10.7*  HCT 32.1* 33.0*  PLT 169 180  APTT -- --  LABPROT 15.0 14.6  INR 1.16 1.12  HEPARINUNFRC -- --  CREATININE 1.40* 1.60*  CKTOTAL -- --  CKMB -- --  TROPONINI -- --    Estimated Creatinine Clearance: 52 ml/min (by C-G formula based on Cr of 1.4).   Medications:  Scheduled:    . amLODipine  5 mg Oral BID  . buPROPion  150 mg Oral Daily  . cloNIDine  0.1 mg Oral BID  . docusate sodium  100 mg Oral BID  . enoxaparin  30 mg Subcutaneous Q12H  . famotidine  20 mg Oral Daily  . insulin aspart  0-20 Units Subcutaneous TID WC  . insulin aspart  0-5 Units Subcutaneous QHS  . insulin aspart  15 Units Subcutaneous TID WC  . insulin glargine  40 Units Subcutaneous QHS  . loratadine  10 mg Oral Daily  . simvastatin  20 mg Oral q1800  . spironolactone  50 mg Oral Daily  . vancomycin  1,000 mg Intravenous Q12H  . warfarin  7.5 mg Oral ONCE-1800  . warfarin   Does not apply Once  . Warfarin - Pharmacist Dosing Inpatient   Does not apply q1800  . DISCONTD: insulin aspart  10 Units Subcutaneous TID WC  . DISCONTD: insulin glargine  20 Units Subcutaneous QHS  . DISCONTD: insulin glargine  30 Units Subcutaneous QHS    Assessment: 65 YOF s/p R TKA.  Coumadin for VTE prophylaxis.  INR = 1.16. Hgb = 10.5, plts = 169 (both stable). No bleeding noted.  INR slow to increase.   Patient also on lovenox 30mg  SQ q12h  Goal of Therapy:  INR 2-3 Monitor platelets by anticoagulation protocol: Yes   Plan:  1. Coumadin 7.5mg  today 2. Daily INR 3. D/C [facility]WH when INR >=1.8 as per order comments.  Dannielle Huh 10/21/2011,8:49 AM

## 2011-10-21 NOTE — Progress Notes (Signed)
Subjective: Less drowsy this morning.  Pain controlled.  Slow progress with therapy.  Agrees to short snf placement for rehab.     Objective: Vital signs in last 24 hours: Temp:  [97.9 F (36.6 C)-98.7 F (37.1 C)] 97.9 F (36.6 C) (06/28 0619) Pulse Rate:  [97-101] 97  (06/28 0619) Resp:  [14-20] 16  (06/28 0619) BP: (157-186)/(52-74) 157/74 mmHg (06/28 0619) SpO2:  [97 %-100 %] 97 % (06/28 0619)  Intake/Output from previous day: 06/27 0701 - 06/28 0700 In: 480 [P.O.:480] Out: 1100 [Urine:1000; Drains:100] Intake/Output this shift:     Basename 10/21/11 0525 10/20/11 0540  HGB 10.5* 10.7*    Basename 10/21/11 0525 10/20/11 0540  WBC 12.6* 10.3  RBC 3.48* 3.51*  HCT 32.1* 33.0*  PLT 169 180    Basename 10/21/11 0525 10/20/11 0540  NA 130* 131*  K 4.8 5.0  CL 97 99  CO2 26 23  BUN 19 26*  CREATININE 1.40* 1.60*  GLUCOSE 247* 215*  CALCIUM 9.2 8.8    Basename 10/21/11 0525 10/20/11 0540  LABPT -- --  INR 1.16 1.12    Exam:  Wound looks good.  Staples intact.  Drain removed. Calf nt, nvi.    Assessment/Plan: Arrange transfer to [facility] Saturday.  Continue present care.     Valerie Stevenson 10/21/2011, 12:48 PM

## 2011-10-21 NOTE — Clinical Social Work Psychosocial (Signed)
     Clinical Social Work Department BRIEF PSYCHOSOCIAL ASSESSMENT 10/21/2011  Patient:  Valerie Stevenson,Valerie Stevenson     Account Number:  [id number]     Admit date:  10/19/2011  Clinical Social Worker:  Burnard Hawthorne  Date/Time:  10/21/2011 02:41 PM  Referred by:  Physician  Date Referred:  10/20/2011 Referred for  SNF Placement   Other Referral:   Interview type:  Other - See comment Other interview type:   Met with patient and husband    PSYCHOSOCIAL DATA Living Status:  HUSBAND Admitted from facility:   Level of care:   Primary support name:  Valerie Stevenson Primary support relationship to patient:  SPOUSE Degree of support available:   Husband is involved    CURRENT CONCERNS Current Concerns  Post-Acute Placement   Other Concerns:   Patient is very reluctant to go to SNF but it is encouraged by PT and MD for short term.    SOCIAL WORK ASSESSMENT / PLAN CSW referred to see this patient for short term SNF rehab. Patient really wants to return home but after talking wiht MD has reluctantly agreed to short term stay.She lives at home with her husband but states that she has concerns over his ability to assist with her home care needs.  Discussed bed search process and patient states that she would like placement at [facility] if possible. Contacted Donne Hazel at [place] and she agreed to review referral which was sent. Will be stable for d/c tomorrow per MD.   Assessment/plan status:  Psychosocial Support/Ongoing Assessment of Needs Other assessment/ plan:   Information/referral to community resources:   SNF offered but declined as patient only wants [facility] and tentative bed is available.    PATIENTS/FAMILYS RESPONSE TO PLAN OF CARE: Patient is alert, oriented and involved in her d/c planning process. While she is agreeable to short term state- she remains quite reluctant.  After talking with her husband however, she agreed to proceed with placement.

## 2011-10-22 LAB — CBC
Hemoglobin: 10.3 g/dL — ABNORMAL LOW (ref 12.0–15.0)
MCHC: 32.8 g/dL (ref 30.0–36.0)
RDW: 13.2 % (ref 11.5–15.5)
WBC: 11.9 10*3/uL — ABNORMAL HIGH (ref 4.0–10.5)

## 2011-10-22 LAB — BASIC METABOLIC PANEL
Chloride: 100 mEq/L (ref 96–112)
GFR calc Af Amer: 40 mL/min — ABNORMAL LOW (ref >90)
GFR calc non Af Amer: 35 mL/min — ABNORMAL LOW (ref >90)
Potassium: 4.6 mEq/L (ref 3.5–5.1)
Sodium: 136 mEq/L (ref 135–145)

## 2011-10-22 LAB — PROTIME-INR
INR: 1.47 (ref 0.00–1.49)
Prothrombin Time: 18.1 seconds — ABNORMAL HIGH (ref 11.6–15.2)

## 2011-10-22 LAB — GLUCOSE, CAPILLARY
Glucose-Capillary: 189 mg/dL — ABNORMAL HIGH (ref 70–99)
Glucose-Capillary: 265 mg/dL — ABNORMAL HIGH (ref 70–99)

## 2011-10-22 MED ORDER — INSULIN ASPART 100 UNIT/ML ~~LOC~~ SOLN: SUBCUTANEOUS | Status: DC

## 2011-10-22 NOTE — Progress Notes (Signed)
Pt to transfer to [facility] today via [facility].  Pt, pt's husband, and SNF aware of d/c.  D/C packet complete with chart copy, signed FL2, and signed hard scripts. CSW signing off as no other CSW needs identified. Dellie Burns, MSW, [place] [phone] (weekend)

## 2011-10-22 NOTE — Progress Notes (Signed)
Subjective:  No complaints, ready for SNF.  Objective:   VITALS:   Filed Vitals:   10/21/11 1500 10/21/11 2000 10/21/11 2210 10/22/11 0551  BP: 171/60  160/43 156/49  Pulse: 92  96 84  Temp: 97.8 F (36.6 C)  99.9 F (37.7 C) 98.3 F (36.8 C)  TempSrc:      Resp: 17 18 20 20   Height:      Weight:      SpO2: 92% 100% 96% 93%    Neurologically intact Dorsiflexion/Plantar flexion intact  LABS  Results for orders placed during the hospital encounter of 10/19/11 (from the past 24 hour(s))  GLUCOSE, CAPILLARY     Status: Abnormal   Collection Time   10/21/11  1:04 PM      Component Value Range   Glucose-Capillary 291 (*) 70 - 99 mg/dL   Comment 1 Documented in Chart     Comment 2 Notify RN    GLUCOSE, CAPILLARY     Status: Abnormal   Collection Time   10/21/11  4:28 PM      Component Value Range   Glucose-Capillary 235 (*) 70 - 99 mg/dL   Comment 1 Documented in Chart     Comment 2 Notify RN    GLUCOSE, CAPILLARY     Status: Abnormal   Collection Time   10/21/11  9:41 PM      Component Value Range   Glucose-Capillary 152 (*) 70 - 99 mg/dL  PROTIME-INR     Status: Abnormal   Collection Time   10/22/11  5:04 AM      Component Value Range   Prothrombin Time 18.1 (*) 11.6 - 15.2 seconds   INR 1.47  0.00 - 1.49  CBC     Status: Abnormal   Collection Time   10/22/11  5:04 AM      Component Value Range   WBC 11.9 (*) 4.0 - 10.5 K/uL   RBC 3.42 (*) 3.87 - 5.11 MIL/uL   Hemoglobin 10.3 (*) 12.0 - 15.0 g/dL   HCT 16.1 (*) 09.6 - 04.5 %   MCV 91.8  78.0 - 100.0 fL   MCH 30.1  26.0 - 34.0 pg   MCHC 32.8  30.0 - 36.0 g/dL   RDW 40.9  81.1 - 91.4 %   Platelets 169  150 - 400 K/uL  BASIC METABOLIC PANEL     Status: Abnormal   Collection Time   10/22/11  5:04 AM      Component Value Range   Sodium 136  135 - 145 mEq/L   Potassium 4.6  3.5 - 5.1 mEq/L   Chloride 100  96 - 112 mEq/L   CO2 26  19 - 32 mEq/L   Glucose, Bld 169 (*) 70 - 99 mg/dL   BUN 24 (*) 6 - 23  mg/dL   Creatinine, Ser 7.82 (*) 0.50 - 1.10 mg/dL   Calcium 9.0  8.4 - 95.6 mg/dL   GFR calc non Af Amer 35 (*) >90 mL/min   GFR calc Af Amer 40 (*) >90 mL/min  GLUCOSE, CAPILLARY     Status: Abnormal   Collection Time   10/22/11  6:55 AM      Component Value Range   Glucose-Capillary 189 (*) 70 - 99 mg/dL    No results found.  Assessment/Plan: 3 Days Post-Op TKA  Active Problems:  HYPERTHYROIDISM  DEPRESSION  HYPERTENSION  ASTHMA  GERD  DIVERTICULOSIS, COLON  Type I (juvenile type) diabetes mellitus  with renal manifestations, uncontrolled   Advance diet Discharge to SNF   Avery Eustice P 10/22/2011, 10:56 AM   Teryl Lucy, MD [phone] pager

## 2011-10-22 NOTE — Progress Notes (Signed)
ANTICOAGULATION CONSULT NOTE - Follow Up Consult  Pharmacy Consult for Coumadin Indication: VTE prophylaxis s/o right TKA  Allergies  Allergen Reactions  . Latex     rash  . Penicillins     REACTION: Hives  . Shellfish-Derived Products     Anaphylactoid reaction   . Morphine And Related Itching    Patient Measurements: Height: 5' 5.5" (166.4 cm) Weight: 267 lb 6.7 oz (121.3 kg) IBW/kg (Calculated) : 58.15  Heparin Dosing Weight:   Vital Signs: Temp: 98.3 F (36.8 C) (06/29 0551) BP: 156/49 mmHg (06/29 0551) Pulse Rate: 84  (06/29 0551)  Labs:  Basename 10/22/11 0504 10/21/11 0525 10/20/11 0540  HGB 10.3* 10.5* --  HCT 31.4* 32.1* 33.0*  PLT 169 169 180  APTT -- -- --  LABPROT 18.1* 15.0 14.6  INR 1.47 1.16 1.12  HEPARINUNFRC -- -- --  CREATININE 1.52* 1.40* 1.60*  CKTOTAL -- -- --  CKMB -- -- --  TROPONINI -- -- --    Estimated Creatinine Clearance: 47.9 ml/min (by C-G formula based on Cr of 1.52).   Medications:  Scheduled:     . amLODipine  5 mg Oral BID  . buPROPion  150 mg Oral Daily  . cloNIDine  0.1 mg Oral BID  . docusate sodium  100 mg Oral BID  . enoxaparin  30 mg Subcutaneous Q12H  . famotidine  20 mg Oral Daily  . insulin aspart  0-20 Units Subcutaneous TID WC  . insulin aspart  0-5 Units Subcutaneous QHS  . insulin aspart  25 Units Subcutaneous TID WC  . insulin glargine  40 Units Subcutaneous QHS  . loratadine  10 mg Oral Daily  . simvastatin  20 mg Oral q1800  . spironolactone  50 mg Oral Daily  . warfarin  7.5 mg Oral ONCE-1800  . warfarin   Does not apply Once  . Warfarin - Pharmacist Dosing Inpatient   Does not apply q1800  . DISCONTD: insulin aspart  15 Units Subcutaneous TID WC    Assessment: Valerie Stevenson s/p R TKA.  Coumadin for VTE prophylaxis.  INR = 1.47. Hgb = 10.3, plts = 169 (both stable). No bleeding noted.  INR slow to increase.  Patient also on lovenox 30mg  SQ q12h  Goal of Therapy:  INR 2-3 Monitor platelets by  anticoagulation protocol: Yes   Plan:  1. Orders for coumadin 5mg  daily on discharge, this appears appropriate until next follow-up INR.  2. Daily INR while in hospital 3. D/C LMWH when INR >=1.8 as per order comments.  Dannielle Huh 10/22/2011,10:41 AM

## 2011-10-22 NOTE — Progress Notes (Signed)
Physical Therapy Treatment Patient Details Name: Valerie Stevenson MRN: [id number] DOB: 06-21-1945 Today's Date: 10/22/2011 Time: 4098-1191 PT Time Calculation (min): 31 min  PT Assessment / Plan / Recommendation Comments on Treatment Session  Pt admitted s/p right TKA and continues to progress.  Limited by pain this am, but still willing/able to increase ambulation independence.    Follow Up Recommendations  Skilled nursing facility    Barriers to Discharge        Equipment Recommendations  Defer to next venue    Recommendations for Other Services    Frequency 7X/week   Plan Discharge plan remains appropriate;Frequency remains appropriate    Precautions / Restrictions Precautions Precautions: Knee Precaution Booklet Issued: No Required Braces or Orthoses: Knee Immobilizer - Right Knee Immobilizer - Right: On except when in CPM Restrictions Weight Bearing Restrictions: Yes RLE Weight Bearing: Weight bearing as tolerated   Pertinent Vitals/Pain 8/10 in right knee.  Pt premedicated with RN aware as well as repositioned.    Mobility  Bed Mobility Bed Mobility: Supine to Sit Supine to Sit: 4: Min assist Details for Bed Mobility Assistance: Assist for right LE due to pain with cues for sequence. Transfers Transfers: Sit to Stand;Stand to Sit (2 trials.) Sit to Stand: 4: Min assist;With upper extremity assist;From bed;From chair/3-in-1 Stand to Sit: 4: Min assist;With upper extremity assist;To chair/3-in-1 Details for Transfer Assistance: Assist for balance and to off weight right LE due to pain.  Cues for safest hand/right LE placement. Ambulation/Gait Ambulation/Gait Assistance: 4: Min guard Ambulation Distance (Feet): 50 Feet (10 feet and then 40 feet.) Assistive device: Rolling walker Ambulation/Gait Assistance Details: Guarding for balance with cues for tall posture and attempted step-through sequence with initial contact on right heel. Gait Pattern: Step-to  pattern;Decreased step length - right;Decreased stance time - right Stairs: No Wheelchair Mobility Wheelchair Mobility: No    Exercises Total Joint Exercises Ankle Circles/Pumps: AROM;Right;10 reps;Supine Quad Sets: AROM;Right;10 reps;Supine Heel Slides: AAROM;Right;10 reps;Supine Goniometric ROM: AA/ROM right knee 0-60 degrees.   PT Diagnosis:    PT Problem List:   PT Treatment Interventions:     PT Goals Acute Rehab PT Goals PT Goal Formulation: With patient Time For Goal Achievement: 11/03/11 Potential to Achieve Goals: Good PT Goal: Supine/Side to Sit - Progress: Progressing toward goal PT Goal: Sit to Stand - Progress: Progressing toward goal PT Goal: Stand to Sit - Progress: Progressing toward goal PT Goal: Ambulate - Progress: Progressing toward goal PT Goal: Perform Home Exercise Program - Progress: Progressing toward goal  Visit Information  Last PT Received On: 10/22/11 Assistance Needed: +1    Subjective Data  Subjective: "I am so glad you are here." Patient Stated Goal: Go home.   Cognition  Overall Cognitive Status: Appears within functional limits for tasks assessed/performed Arousal/Alertness: Awake/alert Orientation Level: Appears intact for tasks assessed Behavior During Session: [facility] for tasks performed    Balance  Balance Balance Assessed: No  End of Session PT - End of Session Equipment Utilized During Treatment: Gait belt;Right knee immobilizer Activity Tolerance: Patient tolerated treatment well Patient left: in chair;with call bell/phone within reach Nurse Communication: Mobility status   GP     Cephus Shelling 10/22/2011, 9:12 AM  10/22/2011 Cephus Shelling, PT, DPT [phone]

## 2011-11-02 ENCOUNTER — Telehealth: Payer: Self-pay

## 2011-11-02 NOTE — Telephone Encounter (Signed)
Facility called to report elevated blood sugars and request a call back to discuss.

## 2011-11-03 NOTE — Telephone Encounter (Signed)
Left message on machine for pt to return my call  

## 2011-11-04 NOTE — Telephone Encounter (Signed)
I have called pt's living facility back several times and after being transferred to nursing, the phone disconnects. I have informed facility of same and was told that nurse will call back. There has been no return call. Closing phone until further contact regarding this pt.

## 2011-11-14 ENCOUNTER — Telehealth: Payer: Self-pay | Admitting: *Deleted

## 2011-11-14 MED ORDER — INSULIN GLARGINE 100 UNIT/ML ~~LOC~~ SOLN: 30.0000 [IU] | Freq: Every day | SUBCUTANEOUS | Status: DC

## 2011-11-14 MED ORDER — INSULIN ASPART 100 UNIT/ML ~~LOC~~ SOLN: SUBCUTANEOUS | Status: DC

## 2011-11-14 NOTE — Telephone Encounter (Signed)
[facility] RN called to inform MD that pt was recently discharged from rehab facility after knee replacement surgery and to update him on her insulin regimen. The rehab facility wanted to pt take Novolog tid ac 11-29-10 units, but pt's CBGs were elevated so pt went back to regimen before she went in for her surgery. Pt is currently taking Novolog tid ac 37-22-and between 30-40 units with dinner. Pt is also taking Lantus 60 units qhs. CBG on 6/24 was 169 at breakfast, 106 at lunch, and 210 at dinner. 6/25-198 at breakfast and 78 at [facility]. 7/20-155 at breakfast and 115 at lunch. 7/21-130 at breakfast, 75 at lunch and 87 at dinner. CBG this AMwas 164. Pt is asking for MD's advisement regarding CBG's.

## 2011-11-14 NOTE — Telephone Encounter (Signed)
Take lantus 30 units at bedtime, and novolog to (just before each meal) 30-20-35 units

## 2011-11-14 NOTE — Telephone Encounter (Signed)
Pt informed of MD's advisement regarding insulin adjustment.  

## 2011-11-22 ENCOUNTER — Telehealth: Payer: Self-pay

## 2011-11-22 NOTE — Telephone Encounter (Signed)
Increase lantus to 40 units qhs

## 2011-11-22 NOTE — Telephone Encounter (Signed)
Pt called stating that her morning fasting CBG are ranging between 170-190 despite increase in Lantus. Pt is requesting advisement from MD with possible insulin adjustments.

## 2011-11-23 ENCOUNTER — Other Ambulatory Visit: Payer: Self-pay | Admitting: Internal Medicine

## 2011-11-23 MED ORDER — INSULIN GLARGINE 100 UNIT/ML ~~LOC~~ SOLN: 40.0000 [IU] | Freq: Every day | SUBCUTANEOUS | Status: DC

## 2011-11-23 NOTE — Telephone Encounter (Signed)
Pt informed of MD's advisement regarding insulin adjustment.  

## 2011-12-01 ENCOUNTER — Other Ambulatory Visit: Payer: Self-pay | Admitting: *Deleted

## 2011-12-01 ENCOUNTER — Telehealth: Payer: Self-pay

## 2011-12-01 MED ORDER — INSULIN GLARGINE 100 UNIT/ML ~~LOC~~ SOLN: 50.0000 [IU] | Freq: Every day | SUBCUTANEOUS | Status: DC

## 2011-12-01 MED ORDER — CLONIDINE HCL 0.1 MG PO TABS: 0.2000 mg | ORAL_TABLET | Freq: Two times a day (BID) | ORAL | Status: DC

## 2011-12-01 NOTE — Telephone Encounter (Signed)
Pt informed of MD's advisement regarding insulin adjustment.  

## 2011-12-01 NOTE — Telephone Encounter (Signed)
Increase lantus to 50 units qhs

## 2011-12-01 NOTE — Telephone Encounter (Signed)
Pt states that during re-hab for knee surgery her clonidine (CATAPRES) 0.1 MG tablet was increased to 0.2 mg bid. Pt is now calling because she will run out of med since she has increase dose. Pt notes med was increase due to her BP running high. .Please advise ok to fill with new instruction or would you prefer OV

## 2011-12-01 NOTE — Telephone Encounter (Signed)
Clonidine 0.2 mg twice a day, dispense 180

## 2011-12-01 NOTE — Telephone Encounter (Signed)
Pt called stating that AM blood sugars continue to be elevated - 170-190, while CBG throughout the day are normal. Pt is requesting advisement from MD on medication adjustment, please advise.

## 2011-12-01 NOTE — Telephone Encounter (Signed)
Pt aware, Rx sent. 

## 2011-12-20 ENCOUNTER — Encounter: Payer: Self-pay | Admitting: Internal Medicine

## 2011-12-20 ENCOUNTER — Other Ambulatory Visit: Payer: Self-pay | Admitting: Internal Medicine

## 2011-12-20 ENCOUNTER — Ambulatory Visit (INDEPENDENT_AMBULATORY_CARE_PROVIDER_SITE_OTHER): Payer: Medicare Other | Admitting: Internal Medicine

## 2011-12-20 VITALS — BP 140/72 | HR 84 | Temp 98.1°F | Wt 260.0 lb

## 2011-12-20 DIAGNOSIS — J358 Other chronic diseases of tonsils and adenoids: Secondary | ICD-10-CM

## 2011-12-20 MED ORDER — AZITHROMYCIN 250 MG PO TABS: ORAL_TABLET | ORAL | Status: AC

## 2011-12-20 NOTE — Assessment & Plan Note (Signed)
Findings consistent with an asymptomatic tonsillith, I was actually able to remove most of the left side one and showed to the patient. The patient is quite concerned about a tonsil infection given recent total knee replacement. To err in the side of caution, I went ahead and prescribed a Z-Pak. Recommend to continue gargling, call if she has fever, chills or sore throat. Otherwise no treatment will be needed.

## 2011-12-20 NOTE — Patient Instructions (Addendum)
Take the antibiotic as prescribed The condition you have is called Tonsilolith

## 2011-12-20 NOTE — Progress Notes (Signed)
  Subjective:    Patient ID: Valerie Stevenson, female    DOB: 09/19/1945, 66 y.o.   MRN: [id number]  HPI Acute visit Few days ago noted a white spot on the left tonsil, she is quite concerned because he had a total knee replacement 2 months ago and likes to avoid any infection.  Past Medical History  Diagnosis Date  . Diabetes mellitus      IDDM;Dr Everardo All  . Toxic multinodular goiter     Dr Everardo All  . OSA (obstructive sleep apnea)     CPAP;Dr Clint Young  . Glaucoma   . Anemia     iron deficinecy  . Asthma   . COPD (chronic obstructive pulmonary disease)   . Depression   . GERD (gastroesophageal reflux disease)   . Hyperlipidemia   . Hypertension   . Hyperthyroidism     in context of nodule; Tapazole therapy since 2007  . Skin cancer     basal cell  . Diverticulosis of colon   . Urticaria, idiopathic   . Renal disease     insufficiency   -- saw Nephrologist  in [place]..hasn't seen one in 2-3 yrs  . Arthritis   . Eczema       Review of Systems No fever or chills No sore throat right nose No cough or shortness of breath.    Objective:   Physical Exam  Constitutional: She appears well-developed. No distress.  HENT:  Head: Normocephalic and atraumatic.  Right Ear: External ear normal.  Left Ear: External ear normal.  Nose: Nose normal.  Mouth/Throat: Oropharynx is clear and moist.       Tonsils normal size, on the left one she has a 4x4 mm tonsillith. On the right tonsil he has a smaller but deeper tonsillith.  Skin: She is not diaphoretic.          Assessment & Plan:

## 2011-12-21 ENCOUNTER — Encounter: Payer: Self-pay | Admitting: Endocrinology

## 2011-12-21 ENCOUNTER — Other Ambulatory Visit (INDEPENDENT_AMBULATORY_CARE_PROVIDER_SITE_OTHER): Payer: Medicare Other

## 2011-12-21 ENCOUNTER — Ambulatory Visit (INDEPENDENT_AMBULATORY_CARE_PROVIDER_SITE_OTHER): Payer: Medicare Other | Admitting: Endocrinology

## 2011-12-21 VITALS — BP 130/68 | HR 68 | Temp 98.1°F | Wt 261.0 lb

## 2011-12-21 DIAGNOSIS — N058 Unspecified nephritic syndrome with other morphologic changes: Secondary | ICD-10-CM

## 2011-12-21 DIAGNOSIS — E1029 Type 1 diabetes mellitus with other diabetic kidney complication: Secondary | ICD-10-CM

## 2011-12-21 DIAGNOSIS — E1065 Type 1 diabetes mellitus with hyperglycemia: Secondary | ICD-10-CM

## 2011-12-21 LAB — HEMOGLOBIN A1C: Hgb A1c MFr Bld: 7.6 % — ABNORMAL HIGH (ref 4.6–6.5)

## 2011-12-21 NOTE — Progress Notes (Signed)
Subjective:    Patient ID: Valerie Stevenson, female    DOB: Mar 14, 1946, 66 y.o.   MRN: [id number]  HPI Pt returns for f/u of insulin-requiring DM (dx'ed; complicated by retinopathy and renal insufficiency).  she brings a record of her cbg's which i have reviewed today.  cbg's are often over 200.  It is most consistently high in am.  It is higher in am than at hs.  She attributes this to inactivity after her recent TKR.   In 2012, pt had i-131 rx for hyperthyroidism, due to multinodular goiter.   She reports ongoing weight gain Past Medical History  Diagnosis Date  . Diabetes mellitus      IDDM;Dr Everardo All  . Toxic multinodular goiter     Dr Everardo All  . OSA (obstructive sleep apnea)     CPAP;Dr Clint Young  . Glaucoma   . Anemia     iron deficinecy  . Asthma   . COPD (chronic obstructive pulmonary disease)   . Depression   . GERD (gastroesophageal reflux disease)   . Hyperlipidemia   . Hypertension   . Hyperthyroidism     in context of nodule; Tapazole therapy since 2007  . Skin cancer     basal cell  . Diverticulosis of colon   . Urticaria, idiopathic   . Renal disease     insufficiency   -- saw Nephrologist  in [place]..hasn't seen one in 2-3 yrs  . Arthritis   . Eczema     Past Surgical History  Procedure Date  . Total abdominal hysterectomy w/ bilateral salpingoophorectomy   . Replacement total knee 2003    left ; Dr Sullivan Lone; [place]  . Total knee arthroplasty 10/19/2011    Procedure: TOTAL KNEE ARTHROPLASTY;  Surgeon: Loreta Ave, MD;  Location: [facility] OR;  Service: Orthopedics;  Laterality: Right;  DR MURPHY WANTS 90 MINUTES FOR THIS CASE OSTEONICS    History   Social History  . Marital Status: Married    Spouse Name: N/A    Number of Children: N/A  . Years of Education: N/A   Occupational History  . RN-Retired    Social History Main Topics  . Smoking status: Never Smoker   . Smokeless tobacco: Never Used  . Alcohol Use: No  . Drug Use: No  .  Sexually Active: Not on file   Other Topics Concern  . Not on file   Social History Narrative  . No narrative on file    Current Outpatient Prescriptions on File Prior to Visit  Medication Sig Dispense Refill  . amLODipine (NORVASC) 5 MG tablet TAKE ONE TABLET BY MOUTH TWICE DAILY  50 tablet  5  . B-D UF III MINI PEN NEEDLES 31G X 5 MM MISC As directed      . buPROPion (WELLBUTRIN XL) 150 MG 24 hr tablet Take 1 tablet (150 mg total) by mouth daily.  90 tablet  1  . cloNIDine (CATAPRES) 0.1 MG tablet Take 2 tablets (0.2 mg total) by mouth 2 (two) times daily.  180 tablet  0  . Glucose Blood (PRECISION XTRA TEST VI)       . Insulin Syringe-Needle U-100 (INSULIN SYRINGE 1CC/31GX5/16") 31G X 5/16" 1 ML MISC Use as directed once daily  100 each  3  . LORazepam (ATIVAN) 0.5 MG tablet Take 0.5 mg by mouth as needed. For anxiety      . pravastatin (PRAVACHOL) 40 MG tablet Take 40 mg by mouth daily.        Marland Kitchen  Probiotic Product (PROBIOTIC PO) Take 1 tablet by mouth daily.      . ranitidine (ZANTAC) 150 MG tablet TAKE ONE TABLET BY MOUTH TWICE DAILY  60 tablet  5  . RELION PEN NEEDLE 31G/8MM 31G X 8 MM MISC THREE TIMES DAILY  100 each  10  . RELION ULTRA THIN PLUS LANCETS MISC As directed       . spironolactone (ALDACTONE) 50 MG tablet TAKE ONE TABLET BY MOUTH EVERY DAY  30 tablet  3  . azithromycin (ZITHROMAX Z-PAK) 250 MG tablet As directed  6 each  0  . insulin aspart (NOVOLOG FLEXPEN) 100 UNIT/ML injection Inject subcutaneously three times a day (just before each meal) 30-20-35 units  30 mL  4  . insulin glargine (LANTUS) 100 UNIT/ML injection Inject 60 Units into the skin at bedtime.  18 mL  4    Allergies  Allergen Reactions  . Latex     rash  . Penicillins     REACTION: Hives  . Shellfish-Derived Products     Anaphylactoid reaction   . Morphine And Related Itching    Family History  Problem Relation Age of Onset  . Coronary artery disease Father     no MI  . Stroke Mother      in 75s  . Diabetes Mother   . Ovarian cancer Sister     twin sibling  . Alcohol abuse Maternal Grandfather   . Hyperthyroidism Sister     twin  . Goiter Sister     resected  . COPD Father     BP 130/68  Pulse 68  Temp 98.1 F (36.7 C) (Oral)  Wt 261 lb (118.389 kg)  SpO2 96%   Review of Systems denies hypoglycemia and numbness    Objective:   Physical Exam VITAL SIGNS:  See vs page GENERAL: no distress NECK: There is no palpable thyroid enlargement.  No thyroid nodule is palpable.  No palpable lymphadenopathy at the anterior neck. Pulses: dorsalis pedis intact bilat.   Feet: no deformity.  no ulcer on the feet.  feet are of normal color and temp.  no edema Neuro: sensation is intact to touch on the feet.    Lab Results  Component Value Date   HGBA1C 7.3* 09/21/2011   Lab Results  Component Value Date   TSH 1.38 09/21/2011       Assessment & Plan:  DM: needs increased rx Hyperthyroidism, better after i-131 rx, but she is at risk for recurrence Multinodular goiter, s/p i-131 rx: i am unable to palpate

## 2011-12-21 NOTE — Patient Instructions (Addendum)
blood tests are being requested for you today.  You will receive a letter with results.   pending the test results, please continue lantus 50 units at bedtime, and reduce novolog to (just before each meal) 30-20-35.  Please make a follow-up appointment in 3 months.   check your blood sugar 2 times a day.  vary the time of day when you check, between before the 3 meals, and at bedtime.  also check if you have symptoms of your blood sugar being too high or too low.  please keep a record of the readings and bring it to your next appointment here.  please call us sooner if your blood sugar goes below 70, or if it stays over 200.   most of the time, a "lumpy thyroid" will eventually become overactive again.  this is usually a slow process, happening over the span of many years.

## 2011-12-23 ENCOUNTER — Telehealth: Payer: Self-pay | Admitting: *Deleted

## 2011-12-23 MED ORDER — INSULIN ASPART 100 UNIT/ML ~~LOC~~ SOLN: SUBCUTANEOUS | Status: DC

## 2011-12-23 MED ORDER — INSULIN GLARGINE 100 UNIT/ML ~~LOC~~ SOLN: 60.0000 [IU] | Freq: Every day | SUBCUTANEOUS | Status: DC

## 2011-12-23 NOTE — Telephone Encounter (Signed)
Called pt to inform of lab results, pt informed via VM and to callback office with any question/concerns (letter also mailed to pt).

## 2011-12-27 ENCOUNTER — Other Ambulatory Visit: Payer: Self-pay | Admitting: Internal Medicine

## 2012-02-13 ENCOUNTER — Other Ambulatory Visit: Payer: Self-pay | Admitting: Internal Medicine

## 2012-02-23 ENCOUNTER — Telehealth: Payer: Self-pay | Admitting: Internal Medicine

## 2012-02-23 MED ORDER — CLONIDINE HCL 0.1 MG PO TABS: 0.2000 mg | ORAL_TABLET | Freq: Two times a day (BID) | ORAL | Status: DC

## 2012-02-23 NOTE — Telephone Encounter (Signed)
RX sent (90 day supply) , please contact patient for CPX appointment

## 2012-02-23 NOTE — Telephone Encounter (Signed)
refill CloNIDine (Tab) 0.2 MG Take 2 tablets (0.2 mg total) by mouth 2 (two) times daily.  #60 last fill 7.19.13-last ov 8.27.13 acute NOTE LAST WRT 8.8.13 AS 0.1MG 

## 2012-02-24 NOTE — Telephone Encounter (Signed)
lmovm for pt to call office. °

## 2012-02-27 NOTE — Telephone Encounter (Signed)
called to schedule with CPE 1.24.14 at 230pm

## 2012-03-14 ENCOUNTER — Other Ambulatory Visit: Payer: Self-pay | Admitting: Internal Medicine

## 2012-03-14 NOTE — Telephone Encounter (Signed)
Rx sent.    MW 

## 2012-03-26 ENCOUNTER — Other Ambulatory Visit: Payer: Self-pay | Admitting: Internal Medicine

## 2012-03-28 ENCOUNTER — Encounter: Payer: Self-pay | Admitting: Endocrinology

## 2012-03-28 ENCOUNTER — Ambulatory Visit (INDEPENDENT_AMBULATORY_CARE_PROVIDER_SITE_OTHER): Payer: Medicare Other | Admitting: Endocrinology

## 2012-03-28 VITALS — BP 152/70 | HR 100 | Temp 98.4°F | Wt 264.0 lb

## 2012-03-28 DIAGNOSIS — E119 Type 2 diabetes mellitus without complications: Secondary | ICD-10-CM

## 2012-03-28 DIAGNOSIS — E0590 Thyrotoxicosis, unspecified without thyrotoxic crisis or storm: Secondary | ICD-10-CM

## 2012-03-28 NOTE — Progress Notes (Signed)
Subjective:    Patient ID: Valerie Stevenson, female    DOB: August 03, 1945, 66 y.o.   MRN: [id number]  HPI Pt returns for f/u of insulin-requiring DM (dx'ed; complicated by retinopathy and renal insufficiency).  she brings a record of her cbg's which i have reviewed today.  It varies from 95-200, but most are in the mid-100's   She does not check at hs.  It is lowest in the afternoon, and highest in am.   In 2012, pt had i-131 rx for hyperthyroidism, due to multinodular goiter.  Past Medical History  Diagnosis Date  . Diabetes mellitus      IDDM;Dr Everardo All  . Toxic multinodular goiter     Dr Everardo All  . OSA (obstructive sleep apnea)     CPAP;Dr Clint Young  . Glaucoma(365)   . Anemia     iron deficinecy  . Asthma   . COPD (chronic obstructive pulmonary disease)   . Depression   . GERD (gastroesophageal reflux disease)   . Hyperlipidemia   . Hypertension   . Hyperthyroidism     in context of nodule; Tapazole therapy since 2007  . Skin cancer     basal cell  . Diverticulosis of colon   . Urticaria, idiopathic   . Renal disease     insufficiency   -- saw Nephrologist  in [place]..hasn't seen one in 2-3 yrs  . Arthritis   . Eczema     Past Surgical History  Procedure Date  . Total abdominal hysterectomy w/ bilateral salpingoophorectomy   . Replacement total knee 2003    left ; Dr Sullivan Lone; [place]  . Total knee arthroplasty 10/19/2011    Procedure: TOTAL KNEE ARTHROPLASTY;  Surgeon: Loreta Ave, MD;  Location: [facility] OR;  Service: Orthopedics;  Laterality: Right;  DR MURPHY WANTS 90 MINUTES FOR THIS CASE OSTEONICS    History   Social History  . Marital Status: Married    Spouse Name: N/A    Number of Children: N/A  . Years of Education: N/A   Occupational History  . RN-Retired    Social History Main Topics  . Smoking status: Never Smoker   . Smokeless tobacco: Never Used  . Alcohol Use: No  . Drug Use: No  . Sexually Active: Not on file   Other Topics  Concern  . Not on file   Social History Narrative  . No narrative on file    Current Outpatient Prescriptions on File Prior to Visit  Medication Sig Dispense Refill  . amLODipine (NORVASC) 5 MG tablet TAKE ONE TABLET BY MOUTH TWICE DAILY  50 tablet  5  . B-D UF III MINI PEN NEEDLES 31G X 5 MM MISC As directed      . buPROPion (WELLBUTRIN XL) 150 MG 24 hr tablet TAKE ONE TABLET BY MOUTH EVERY DAY  90 tablet  1  . cloNIDine (CATAPRES) 0.1 MG tablet Take 2 tablets (0.2 mg total) by mouth 2 (two) times daily.  180 tablet  0  . Glucose Blood (PRECISION XTRA TEST VI)       . insulin aspart (NOVOLOG FLEXPEN) 100 UNIT/ML injection Inject subcutaneously three times a day (just before each meal) 30-20-35 units  30 mL  4  . insulin glargine (LANTUS) 100 UNIT/ML injection Inject 65 Units into the skin at bedtime.      . Insulin Syringe-Needle U-100 (INSULIN SYRINGE 1CC/31GX5/16") 31G X 5/16" 1 ML MISC Use as directed once daily  100 each  3  .  LORazepam (ATIVAN) 0.5 MG tablet Take 0.5 mg by mouth as needed. For anxiety      . pravastatin (PRAVACHOL) 40 MG tablet Take 40 mg by mouth daily.        . pravastatin (PRAVACHOL) 40 MG tablet TAKE ONE TABLET BY MOUTH EVERY DAY  30 tablet  0  . Probiotic Product (PROBIOTIC PO) Take 1 tablet by mouth daily.      . ranitidine (ZANTAC) 150 MG tablet TAKE ONE TABLET BY MOUTH TWICE DAILY  60 tablet  5  . RELION PEN NEEDLE 31G/8MM 31G X 8 MM MISC THREE TIMES DAILY  100 each  10  . RELION ULTRA THIN PLUS LANCETS MISC As directed       . spironolactone (ALDACTONE) 50 MG tablet TAKE ONE TABLET BY MOUTH EVERY DAY  30 tablet  5    Allergies  Allergen Reactions  . Latex     rash  . Penicillins     REACTION: Hives  . Shellfish-Derived Products     Anaphylactoid reaction   . Morphine And Related Itching    Family History  Problem Relation Age of Onset  . Coronary artery disease Father     no MI  . Stroke Mother     in 15s  . Diabetes Mother   . Ovarian  cancer Sister     twin sibling  . Alcohol abuse Maternal Grandfather   . Hyperthyroidism Sister     twin  . Goiter Sister     resected  . COPD Father     BP 152/70  Pulse 100  Temp 98.4 F (36.9 C) (Oral)  Wt 264 lb (119.75 kg)  SpO2 97%  Review of Systems denies hypoglycemia    Objective:   Physical Exam VITAL SIGNS:  See vs page GENERAL: no distress Neck: i think i can feel the top of a large multinodular goiter, L>R.   Lab Results  Component Value Date   HGBA1C 8.1* 03/28/2012   Lab Results  Component Value Date   TSH 1.900 03/28/2012      Assessment & Plan:  DM: needs increased rx Hypothyroidism, well-replaced

## 2012-03-28 NOTE — Patient Instructions (Addendum)
blood tests are being requested for you today.  We'll contact you with results.   pending the test results, please continue lantus to 65 units at bedtime, and continue novolog, (just before each meal) 30-20-35.  Please make a follow-up appointment in 3 months.   check your blood sugar 2 times a day.  vary the time of day when you check, between before the 3 meals, and at bedtime.  also check if you have symptoms of your blood sugar being too high or too low.  please keep a record of the readings and bring it to your next appointment here.  please call us sooner if your blood sugar goes below 70, or if it stays over 200.   most of the time, a "lumpy thyroid" will eventually become overactive again.  this is usually a slow process, happening over the span of many years.

## 2012-04-11 ENCOUNTER — Encounter: Payer: Self-pay | Admitting: Internal Medicine

## 2012-04-11 ENCOUNTER — Ambulatory Visit (INDEPENDENT_AMBULATORY_CARE_PROVIDER_SITE_OTHER): Payer: Medicare Other | Admitting: Internal Medicine

## 2012-04-11 VITALS — BP 182/80 | HR 99 | Temp 98.0°F | Wt 266.0 lb

## 2012-04-11 DIAGNOSIS — I10 Essential (primary) hypertension: Secondary | ICD-10-CM

## 2012-04-11 DIAGNOSIS — F329 Major depressive disorder, single episode, unspecified: Secondary | ICD-10-CM

## 2012-04-11 DIAGNOSIS — F3289 Other specified depressive episodes: Secondary | ICD-10-CM

## 2012-04-11 MED ORDER — CLONIDINE HCL 0.2 MG PO TABS: 0.4000 mg | ORAL_TABLET | Freq: Two times a day (BID) | ORAL | Status: DC

## 2012-04-11 MED ORDER — LORAZEPAM 0.5 MG PO TABS: 0.5000 mg | ORAL_TABLET | Freq: Every evening | ORAL | Status: DC | PRN

## 2012-04-11 MED ORDER — CITALOPRAM HYDROBROMIDE 20 MG PO TABS: 20.0000 mg | ORAL_TABLET | Freq: Every day | ORAL | Status: DC

## 2012-04-11 NOTE — Assessment & Plan Note (Addendum)
Depression seems to be well-controlled but she has been anxious for the last several months, no obvious source for anxiety. Plan: Add citalopram 20 mg at night. Continue Wellbutrin for now. Ativan at night as needed Reassessment PCP in 4 weeks, discontinue Wellbutrin?. Recommend to see a counselor.

## 2012-04-11 NOTE — Progress Notes (Signed)
Subjective:    Patient ID: Valerie Stevenson, female    DOB: 10/30/1945, 66 y.o.   MRN: [id number]  HPI Acute visit Her chief complaint is: "I have not been feeling well for a while, I think I'm anxious". Reports anxiety for months, no obvious source of anxiety, no problems at home, no financial issues. She has been taking Wellbutrin for a while and she denies any depression or suicidal ideas. She rarely takes Ativan for insomnia.  Also for the last few days her BP has been elevated up to 180/70. The diastolic blood pressure is usually her 50s so her DBP is also high. She's not taking any new medicines, has no change her medications, diet is about the same.  Past Medical History  Diagnosis Date  . Diabetes mellitus      IDDM;Dr Everardo All  . Toxic multinodular goiter     Dr Everardo All  . OSA (obstructive sleep apnea)     CPAP;Dr Clint Young  . Glaucoma(365)   . Anemia     iron deficinecy  . Asthma   . COPD (chronic obstructive pulmonary disease)   . Depression   . GERD (gastroesophageal reflux disease)   . Hyperlipidemia   . Hypertension   . Hyperthyroidism     in context of nodule; Tapazole therapy since 2007  . Skin cancer     basal cell  . Diverticulosis of colon   . Urticaria, idiopathic   . Renal disease     insufficiency   -- saw Nephrologist  in [place]..hasn't seen one in 2-3 yrs  . Arthritis   . Eczema    Past Surgical History  Procedure Date  . Total abdominal hysterectomy w/ bilateral salpingoophorectomy   . Replacement total knee 2003    left ; Dr Sullivan Lone; [place]  . Total knee arthroplasty 10/19/2011    Procedure: TOTAL KNEE ARTHROPLASTY;  Surgeon: Loreta Ave, MD;  Location: [facility] OR;  Service: Orthopedics;  Laterality: Right;  DR MURPHY WANTS 90 MINUTES FOR THIS CASE OSTEONICS   History   Social History  . Marital Status: Married    Spouse Name: N/A    Number of Children: 2  . Years of Education: N/A   Occupational History  . RN-Retired     Social History Main Topics  . Smoking status: Never Smoker   . Smokeless tobacco: Never Used  . Alcohol Use: No  . Drug Use: No  . Sexually Active: Not on file   Other Topics Concern  . Not on file   Social History Narrative  . No narrative on file    Review of Systems Denies chest pains. Occasional palpitation No fever or chills. No headaches or nausea. Long history of left lower extremity edema, at the end of the day; looks normal in the morning .no calf tenderness      Objective:   Physical Exam General -- alert, well-developed, and overweight appearing.  Lungs -- normal respiratory effort, no intercostal retractions, no accessory muscle use, and normal breath sounds.   Heart-- normal rate, regular rhythm, no murmur, and no gallop.   Extremities--  distal left leg slightly swollen compared to the right by approximately 0.8 inches Neurologic-- alert & oriented X3 and strength normal in all extremities. Psych-- Cognition and judgment appear intact. Alert and cooperative with normal attention span and concentration.  Anxious but not depressed appearing.       Assessment & Plan:  Today , I spent more than 25 min with the patient, >  50% of the time counseling

## 2012-04-11 NOTE — Assessment & Plan Note (Signed)
Needs better control. She has a history of on and off left leg edema for a while, worse at the end of the day. Doubt DVT. Will increase clonidine from 0.2 twice a day to 0.4 twice a day. Avoid increase amlodipine due to edema. Low-salt diet discussed  Check BMP and CBC

## 2012-04-11 NOTE — Patient Instructions (Addendum)
Start citalopram 20 mg: Half tablet  every night for the first week, after the first week one tablet every night. Increase clonidine to : 0.2 mg 2 tablets twice a day Ativan as needed for difficulty sleeping Low-salt diet Check the  blood pressure 2 or 3 times a week, be sure it is between 110/60 and 140/85. If it is consistently higher or lower, let me know Schedule appointment to see Dr. Alwyn Ren in one month

## 2012-04-12 ENCOUNTER — Encounter: Payer: Self-pay | Admitting: Internal Medicine

## 2012-04-12 LAB — BASIC METABOLIC PANEL
BUN: 31 mg/dL — ABNORMAL HIGH (ref 6–23)
CO2: 23 mEq/L (ref 19–32)
Chloride: 106 mEq/L (ref 96–112)
GFR: 35.71 mL/min — ABNORMAL LOW (ref >60.00)
Glucose, Bld: 113 mg/dL — ABNORMAL HIGH (ref 70–99)
Potassium: 4.7 mEq/L (ref 3.5–5.1)
Sodium: 138 mEq/L (ref 135–145)

## 2012-04-12 LAB — CBC WITH DIFFERENTIAL/PLATELET
Basophils Absolute: 0 10*3/uL (ref 0.0–0.1)
HCT: 42 % (ref 36.0–46.0)
Hemoglobin: 13.9 g/dL (ref 12.0–15.0)
Lymphs Abs: 2 10*3/uL (ref 0.7–4.0)
MCV: 90.8 fl (ref 78.0–100.0)
Monocytes Absolute: 0.8 10*3/uL (ref 0.1–1.0)
Monocytes Relative: 4.8 % (ref 3.0–12.0)
Neutro Abs: 12.4 10*3/uL — ABNORMAL HIGH (ref 1.4–7.7)
Platelets: 223 10*3/uL (ref 150.0–400.0)
RDW: 13.5 % (ref 11.5–14.6)

## 2012-04-20 ENCOUNTER — Telehealth: Payer: Self-pay | Admitting: *Deleted

## 2012-04-20 DIAGNOSIS — D72829 Elevated white blood cell count, unspecified: Secondary | ICD-10-CM

## 2012-04-20 NOTE — Telephone Encounter (Signed)
Pt left VM that since increasing clonidine to 0.2 mg 2 tablets twice a day she constantly feels sleepy and funny. Pt notes that BP was 124/51 the other day when she checked it. Med changed at OV on 04-11-12

## 2012-04-23 ENCOUNTER — Telehealth: Payer: Self-pay | Admitting: Endocrinology

## 2012-04-23 NOTE — Telephone Encounter (Signed)
Pt called to check status of message. Advise Pt message has been sent to provider awaiting a response will call once he response is given    Discuss with patient, lab results.  Notes Recorded by Wanda Plump, MD on 04/13/2012 at 3:55 PM Advise patient, kidney function is stable White blood cells elevated throat is visit last year, please arrange a hematology referral. Needs to followup with PCP, Dr. Alwyn Ren as recommended

## 2012-04-23 NOTE — Telephone Encounter (Signed)
Called pt. She saw Dr. Everardo All on 12/04 and Lantus was increased form 60 to 75 units qhs. She had good am sugars afterwards: 106-160, but on  12/24: 66 12/27: 108 12/29: 46 12/30: 32 Advised to decrease the Lantus by 10 units, to 65 units and call me in next few days if still low, then will need to also reduce dinner Novolog. She is now taking 30-20-35 units with the 3 meals. Her sugars are WNL during the day (yesterday: 46-91-120, but skipped am Novolog)

## 2012-04-23 NOTE — Telephone Encounter (Signed)
Patient Information:  Caller Name: Paysen  Phone: [phone]  Patient: Valerie Stevenson, Soderman  Gender: Female  DOB: Oct 14, 1945  Age: 66 Years  PCP: Romero Belling (Adults only)  Office Follow Up:  Does the office need to follow up with this patient?: Yes  Instructions For The Office: discuss with pcp and callback by nurse today krs/can  RN Note:  Blood sugars on arising AM 04/15/12 50, 04/22/12 46, 04/23/12 32; recovers well after orange juice to 90's.  Per low blood sugar protocol,  disposition "Discuss with PCP and callback today."  Info to office/Dr. Everardo All for review/callback.  May reach patient at [phone].  krs/can  Symptoms  Reason For Call & Symptoms: low blood sugars AM 04/22/12 and 04/23/12.  Medication changes include increase in clonidine and addition of celexa at hs.  Reviewed Health History In EMR: Yes  Reviewed Medications In EMR: Yes  Reviewed Allergies In EMR: Yes  Reviewed Surgeries / Procedures: Yes  Date of Onset of Symptoms: 04/22/2012  Guideline(s) Used:  Diabetes - Low Blood Sugar  Disposition Per Guideline:   Discuss with PCP and Callback by Nurse Today  Reason For Disposition Reached:   Morning (before breakfast) blood glucose < 80 mg/dl (4.5 mmol/L) and more than once in past week  Advice Given:  N/A

## 2012-04-24 NOTE — Telephone Encounter (Signed)
Discussed with pt, she stated she did not feel like she could wait until next week to be seen. Scheduled pt 1.2.14 @ 2p with Dr. Alwyn Ren.

## 2012-04-24 NOTE — Telephone Encounter (Signed)
Patient reports feeling sleepy- funny since we increased clonodine dose. In reviewing the chart, she was started on SSRIs, also endocrinology just decrease her dose of insulin. Unclear if feeling sleepy related to clonidine or SSRIs or low CBGs. Plan: Advise patient to continue with same BP medications for now, please schedule a visit with PCP next week

## 2012-04-26 ENCOUNTER — Telehealth: Payer: Self-pay | Admitting: Internal Medicine

## 2012-04-26 ENCOUNTER — Ambulatory Visit (INDEPENDENT_AMBULATORY_CARE_PROVIDER_SITE_OTHER): Payer: Medicare Other | Admitting: Internal Medicine

## 2012-04-26 ENCOUNTER — Encounter: Payer: Self-pay | Admitting: Internal Medicine

## 2012-04-26 VITALS — BP 144/88 | HR 75 | Temp 97.5°F | Wt 267.6 lb

## 2012-04-26 DIAGNOSIS — I10 Essential (primary) hypertension: Secondary | ICD-10-CM

## 2012-04-26 MED ORDER — CARVEDILOL 3.125 MG PO TABS: 3.1250 mg | ORAL_TABLET | Freq: Two times a day (BID) | ORAL | Status: DC

## 2012-04-26 NOTE — Telephone Encounter (Signed)
The patient was calling Dr. Elvera Lennox back concerning her low blood sugars.  The patient states that her blood sugar is staying low around 50-70 and will spike up after meals.  The patient is concerned and would like a return call from Dr. Elvera Lennox in Dr. George Hugh absence.  Please call the patient at [phone].

## 2012-04-26 NOTE — Assessment & Plan Note (Signed)
She feels the clonidine is causing some mental status changes. Carvedilol will be initiated and titrated until blood pressure is controlled. At that point clonidine can be weaned and discontinued if possible.

## 2012-04-26 NOTE — Progress Notes (Signed)
  Subjective:    Patient ID: Valerie Stevenson, female    DOB: 08/10/1945, 67 y.o.   MRN: [id number]  HPI CHRONIC HYPERTENSION: Disease Monitoring  Blood pressure range: 132/56-158/79   Chest pain:no  Dyspnea: slight  Claudication:no   Medication compliance:yes Medication Side Effects  Lightheadedness:no but lethargic with increased Clonidine dose   Urinary frequency: no  Edema: no     Preventitive Healthcare:  Exercise: not for 2 mos  Diet Pattern: no pattern  Salt Restriction: "trying to"      Review of Systems  Recently she's been having hypoglycemia with fasting blood sugars as low as the mid-40s. She was instructed to decrease her Lantus from 75 units to 65 units. Since that time she's continued to have hypoglycemia in the 50s.    Objective:   Physical Exam She appears  well-nourished; she is in no acute distress  No definite carotid bruits are present.  Heart rhythm and rate are normal with no significant murmurs or gallops.Accentuated S1  Chest is clear with no increased work of breathing  There is no evidence of aortic aneurysm or renal artery bruits  She has no clubbing or edema.   Pedal pulses are intact   No ischemic skin changes are present        Assessment & Plan:

## 2012-04-26 NOTE — Patient Instructions (Addendum)
Blood Pressure Goal = < 140/90, Ideal is an AVERAGE < 135/85. This AVERAGE should be calculated from @ least 5-7 BP readings taken @ different times of day on different days of week. You should not respond to isolated BP readings , but rather the AVERAGE for that week .Please bring your  blood pressure cuff to office visits to verify that it is reliable.It  can also be checked against the blood pressure device at the pharmacy. Finger or wrist cuffs are not dependable; an arm cuff is   Eat a low-fat diet with lots of fruits and vegetables, up to 7-9 servings per day. Consume less than 30 (preferably ZERO) grams of sugar per day from foods & drinks with High Fructose Corn Syrup (HFCS) sugar as #1,2,3 or # 4 on label.Whole Foods, [employer] & [employer] do not carry products with HFCS. Follow a  low carb nutrition program such as [place] or The [employer] Sugar [employer]  to prevent Diabetes progression . White carbohydrates (potatoes, rice, bread, and pasta) have a high spike of sugar and a high load of sugar. For example a  baked potato has a cup of sugar and a  french fry  2 teaspoons of sugar. Yams, wild  rice, whole grained bread &  wheat pasta have been much lower spike and load of  sugar. Portions should be the size of a deck of cards or your palm.

## 2012-04-28 ENCOUNTER — Other Ambulatory Visit: Payer: Self-pay | Admitting: Internal Medicine

## 2012-04-30 ENCOUNTER — Telehealth: Payer: Self-pay | Admitting: Endocrinology

## 2012-04-30 ENCOUNTER — Other Ambulatory Visit: Payer: Self-pay | Admitting: Internal Medicine

## 2012-04-30 NOTE — Telephone Encounter (Signed)
The patient called to report very low blood sugars in the mornings and that she has decreased her dinner time insulin to 30u and on Thursday 04/26/12, decreased her Lantus to 50u.  The patient wants Dr. Everardo All to be aware that when her blood sugars are low she does not take her insulin.  Please call the patient to discuss her glucose issues at [phone].

## 2012-04-30 NOTE — Telephone Encounter (Signed)
PATIENT NOTIFIED OF INSTRUCTION TO DECREASE LANTUS TO 30 UNITS AT BED TIME. PATIENT STATES SHE HAD SEEN DR. HOPPER THIS AM AND HE SUGGESTED TO ALSO DECREASE SUPPER EVENING DOSE TO 30 UNITS ALSO. PATIENT STATES THIS IS WHAT SHE HAS BEEN DOING AND WILL CONTINUE THIS.

## 2012-04-30 NOTE — Telephone Encounter (Signed)
please call patient: Decrease lantus to 30 units qhs

## 2012-05-16 ENCOUNTER — Ambulatory Visit: Payer: Medicare Other

## 2012-05-16 ENCOUNTER — Ambulatory Visit (HOSPITAL_BASED_OUTPATIENT_CLINIC_OR_DEPARTMENT_OTHER): Payer: Medicare Other | Admitting: Hematology & Oncology

## 2012-05-16 ENCOUNTER — Other Ambulatory Visit (HOSPITAL_BASED_OUTPATIENT_CLINIC_OR_DEPARTMENT_OTHER): Payer: Medicare Other | Admitting: Lab

## 2012-05-16 VITALS — BP 143/42 | HR 56 | Temp 97.9°F | Resp 18 | Ht 64.0 in | Wt 265.0 lb

## 2012-05-16 DIAGNOSIS — D72829 Elevated white blood cell count, unspecified: Secondary | ICD-10-CM

## 2012-05-16 LAB — CHCC SATELLITE - SMEAR

## 2012-05-16 LAB — CBC WITH DIFFERENTIAL (CANCER CENTER ONLY)
BASO%: 0.4 % (ref 0.0–2.0)
EOS%: 6.6 % (ref 0.0–7.0)
LYMPH%: 19 % (ref 14.0–48.0)
MCHC: 32.9 g/dL (ref 32.0–36.0)
MCV: 91 fL (ref 81–101)
MONO#: 1 10*3/uL — ABNORMAL HIGH (ref 0.1–0.9)
MONO%: 10 % (ref 0.0–13.0)
Platelets: 192 10*3/uL (ref 145–400)
RDW: 13 % (ref 11.1–15.7)
WBC: 10 10*3/uL (ref 3.9–10.0)

## 2012-05-16 NOTE — Progress Notes (Signed)
This office note has been dictated.

## 2012-05-17 ENCOUNTER — Encounter: Payer: Self-pay | Admitting: Lab

## 2012-05-17 NOTE — Progress Notes (Signed)
CC:   Valerie Stevenson. Hopper, MD,FACP,FCCP  DIAGNOSIS:  Transient leukocytosis.  HISTORY OF PRESENT ILLNESS:  Valerie Stevenson is a very nice 67 year old white female.  She is followed by Dr. Marga Melnick.  She has multiple medical problems.  She has insulin-dependent diabetes.  She is a former Engineer, civil (consulting).  She has been seeing Dr. Alwyn Ren.  He has noted that she has had some leukocytosis.  Going through the medical record, her white cell count has been going up and down for at least a couple of years.  She has had some transient anemia associated with this.  From the labs that were sent over, going back to April 2012 her white cell count was 16.6, hemoglobin 12, hematocrit 36, and platelet count 167.  MCV was 91.  In June 2013, her white cell count was 10.3.  She was mildly anemic with a hemoglobin of 10.7, hematocrit 33, and platelet count was 180.  In June 2013, white cell count was up to 11.9.  Hemoglobin was 10.3 and platelet count 169.  In December, her white cell count was 15.8, hemoglobin 13.9, hematocrit 42, platelet count 223.  The MCV was 91.  White cell differential was 78 segs, 12 lymphs, and 5 monos.  She had transient BUN and creatinine elevations.  She does have some renal insufficiency.  It is likely this is from the diabetes.  Because of the white cell elevation, it was felt that a hematologic evaluation was indicated.  She has not changed medications.  She has had no kind of travel.  She does not smoke.  She really does not drink.  She denies any rashes.  There has been no leg swelling.  She had no change in bowel or bladder habits.  There has been no cough.  Her last mammogram was I think a couple of years ago.  She has had no dysphagia or odynophagia.  There has been no nausea or vomiting.  PAST MEDICAL HISTORY:  Remarkable for:  1. Hypothyroidism. 2. Hyperlipidemia. 3. Depression. 4. Hypertension. 5. Asthma 6. GERD. 7. Insulin-dependent diabetes. 8.  Obesity.  ALLERGIES: 1. Latex. 2. Penicillin. 3. Shellfish. 4. Morphine. 5. Zestril.  MEDICATIONS:  Norvasc 5 mg p.o. daily, aspirin 81 mg p.o. daily, Wellbutrin XL 150 mg p.o. daily, Coreg 3.125 mg p.o. b.i.d., Catapres 0.4 mg p.o. b.i.d., Lantus insulin 30 units subcu q.h.s., Ativan 0.5 mg p.o. q.h.s. p.r.n., Pravachol 40 mg p.o. daily, Zantac 150 mg p.o. b.i.d., Aldactone 50 mg p.o. daily.  SOCIAL HISTORY:  Negative for tobacco use.  There is rare alcohol use. She has no obvious occupational exposures.  She is a retired Engineer, civil (consulting).  FAMILY HISTORY:  Unremarkable.  There is no obvious hematologic issue in the family.  REVIEW OF SYSTEMS:  As stated in the history of present illness.  No additional findings noted on 12-system review.  PHYSICAL EXAMINATION:  General:  This is an obese white female in no obvious distress.  She is alert and oriented x3.  Vital signs: Temperature of 97.9, pulse 56, respiratory rate 18, blood pressure 143/42.  Weight is 265.  Head and neck:  Normocephalic, atraumatic skull.  There are no ocular or oral lesions.  There are no palpable cervical or supraclavicular lymph nodes.  Lungs:  Clear bilaterally. Cardiac:  Regular rate and rhythm with a normal S1 and S2.  There are no murmurs, rubs, or bruits.  Abdomen:  Soft.  She is somewhat obese.  She has good bowel sounds.  There is no  fluid wave.  There is no palpable hepatosplenomegaly.  Axillary:  No bilateral axillary adenopathy. Extremities:  Some diabetic skin changes in her lower legs.  She has good range of motion of her joints.  Neurologic:  No focal neurological deficits.  LABORATORY STUDIES:  White cell count is 10, hemoglobin 13.1, hematocrit 39.8, platelet count 192.  MCV is 91.  White cell differential shows 64 segs, 19 lymphs, 10 monos.  Peripheral smear shows a normochromic, normocytic population of red blood cells.  There are no nucleated red blood cells.  There are no teardrop cells.  I  see no rouleaux formation.  There are no target cells.  White cells appear normal in morphology and maturation.  I see no immature myeloid or lymphoid forms.  There are no atypical lymphocytes.  She may have a couple of hypersegmented polys.  I see no blasts.  Platelets were adequate in number and size.  IMPRESSION:  Valerie Stevenson is a very charming 67 year old white female with transient leukocytosis.  I looked at her blood smear.  I certainly did not see anything that looked as if this was a marrow problem.  I have to believe that the transient leukocytosis might be related to some type of inflammation within the body.  She is on a lot of different medications.  I suppose that this also could be medication related.  Her blood smear is very reassuring to me that there is no underlying marrow disorder.  I do not see any evidence of a myeloproliferative neoplasm.  I do not believe that we need to send off a JAK2 or BCR/ABL.  I just do not see that chronic myeloid leukemia or polycythemia are issues.  I do not find anything on physical exam that would suggest a hematologic issue.  There is no lymphadenopathy.  I could not palpate her spleen.  I just feel that the leukocytosis may be reactive.  Again, the white cells have been going up and down for a couple years.  This may be a "normal" for Valerie Stevenson.  I had a nice meeting with her.  She is awful nice.  She is very eloquent.  We had good fellowship.  I spent about an hour or so with her.  I told Valerie Stevenson that I just do not think we have to see her back.  I am not sure how much we would really be adding to her medical care.  If there are any issues from the hematologic point of view in the future, please let us know and we will be more than happy to see her.    ______________________________ Josph Macho, M.D. PRE/MEDQ  D:  05/16/2012  T:  05/17/2012  Job:  [id number]

## 2012-05-18 ENCOUNTER — Encounter: Payer: Self-pay | Admitting: Internal Medicine

## 2012-05-18 ENCOUNTER — Ambulatory Visit (INDEPENDENT_AMBULATORY_CARE_PROVIDER_SITE_OTHER): Payer: Medicare Other | Admitting: Internal Medicine

## 2012-05-18 VITALS — BP 122/64 | HR 65 | Temp 97.5°F | Resp 14 | Ht 65.03 in | Wt 265.0 lb

## 2012-05-18 DIAGNOSIS — N058 Unspecified nephritic syndrome with other morphologic changes: Secondary | ICD-10-CM

## 2012-05-18 DIAGNOSIS — Z0000 Encounter for general adult medical examination without abnormal findings: Secondary | ICD-10-CM

## 2012-05-18 DIAGNOSIS — E1129 Type 2 diabetes mellitus with other diabetic kidney complication: Secondary | ICD-10-CM

## 2012-05-18 DIAGNOSIS — IMO0002 Reserved for concepts with insufficient information to code with codable children: Secondary | ICD-10-CM

## 2012-05-18 DIAGNOSIS — G4733 Obstructive sleep apnea (adult) (pediatric): Secondary | ICD-10-CM

## 2012-05-18 DIAGNOSIS — E1165 Type 2 diabetes mellitus with hyperglycemia: Secondary | ICD-10-CM

## 2012-05-18 DIAGNOSIS — K5730 Diverticulosis of large intestine without perforation or abscess without bleeding: Secondary | ICD-10-CM

## 2012-05-18 DIAGNOSIS — Z85828 Personal history of other malignant neoplasm of skin: Secondary | ICD-10-CM

## 2012-05-18 DIAGNOSIS — H3500 Unspecified background retinopathy: Secondary | ICD-10-CM

## 2012-05-18 DIAGNOSIS — J45909 Unspecified asthma, uncomplicated: Secondary | ICD-10-CM

## 2012-05-18 DIAGNOSIS — Z23 Encounter for immunization: Secondary | ICD-10-CM

## 2012-05-18 MED ORDER — CLONIDINE HCL 0.2 MG PO TABS: ORAL_TABLET | ORAL | Status: DC

## 2012-05-18 MED ORDER — ALBUTEROL SULFATE HFA 108 (90 BASE) MCG/ACT IN AERS: 2.0000 | INHALATION_SPRAY | RESPIRATORY_TRACT | Status: AC | PRN

## 2012-05-18 NOTE — Progress Notes (Signed)
Subjective:    Patient ID: Valerie Stevenson, female    DOB: 25-Dec-1945, 67 y.o.   MRN: [id number]  HPI Medicare Wellness Visit:  Psychosocial & medical history were reviewed as required by Medicare (abuse,antisocial behavioral risks,firearm risk).  Social history: caffeine: no  , alcohol: no  ,  tobacco use:  never Exercise :  no No home & personal  safety / fall risk Activities of daily living: no limitations  Seatbelt  and smoke alarm employed. Power of Attorney/Living Will status : in place Ophthalmology exam current Hearing evaluation not current Orientation :oriented X 3  Memory & recall :good Spelling  testing:good Mood & affect : normal . Depression / anxiety: seasonally only , off Celexa Travel history : last 23s [place]  Immunization status : PNA today; Shingles needed Transfusion history:  With TAH/BSO Preventive health surveillance ( colonoscopies, BMD , mammograms,PAP as per protocol/ [facility]): colonoscopy & mammogram  due Dental care:  Every 12 mos. Chart reviewed &  Updated. Active issues reviewed & addressed.       Review of Systems She describes excess gas , constipation  & mucoid stool w/o abdominal pain since mid 12/13 when she D/Ced probiotic.  Symptoms better with resumption of probiotic.She has past medical history of IBS.   Nausea ,vomiting,  diarrhea, melana, or rectal bleeding were not described. There was no associated dyspepsia, dysphagia, anorexia, or hematemesis.   Fever, chills,  sweats, and weight loss were not present.  Dysuria, pyuria, and hematuria were absent         Objective:   Physical Exam Gen.:  well-nourished in appearance but weight excess. Alert, appropriate and cooperative throughout exam.  Head: Normocephalic without obvious abnormalities Eyes: No corneal or conjunctival inflammation noted. Extraocular motion intact. Vision grossly normal. Ears: External  ear exam reveals no significant lesions or deformities. Canals clear .TMs  normal. Hearing is grossly normal bilaterally. Nose: External nasal exam reveals no deformity or inflammation. Nasal mucosa are pink and moist. No lesions or exudates noted.   Mouth: Oral mucosa and oropharynx reveal no lesions or exudates. Teeth in good repair. Neck: No deformities, masses, or tenderness noted. Range of motion & Thyroid normal Lungs: Normal respiratory effort; chest expands symmetrically. Lungs are clear to auscultation without rales, wheezes, or increased work of breathing. Heart: Normal rate and rhythm. Normal S1 and S2. No gallop, click, or rub. Grade 1/6 systolic murmur R base with R carotid radiation Abdomen: Bowel sounds normal; abdomen soft and nontender. No masses, organomegaly or hernias noted.Protuberant Genitalia: Dr Duane Lope, Nestor Ramp ([facility] of T[facility]/BSO)                                    Musculoskeletal/extremities:Accentuated curvature of upper thoracic  spine. . No clubbing, cyanosis, edema, or significant extremity  deformity noted. Range of motion normal .Tone & strength  normal.Joints normal but crepitus knees.. Nail health good.  Vascular: Carotid, radial artery, dorsalis pedis and  posterior tibial pulses are full and equal. No definite bruits present ( see murmur). Neurologic: Alert and oriented x3. Deep tendon reflexes symmetrical and normal.  Skin: scattered eczematoid lesions . Lymph: No cervical, axillary lymphadenopathy present. Psych: Mood and affect are normal. Normally interactive  Assessment & Plan:  #1 Medicare Wellness Exam; criteria met ; data entered #2 IBS Plan: see Orders

## 2012-05-18 NOTE — Patient Instructions (Addendum)
Please  schedule fasting Lipids . PLEASE TAKE THESE INSTRUCTIONS TO FOLLOW UP  LAB APPOINTMENT.This will guarantee correct labs are drawn, eliminating need for repeat blood sampling ( needle sticks ! ). Diagnoses /Codes: 277.7. Minimal Blood Pressure Goal= AVERAGE < 140/90;  Ideal is an AVERAGE < 135/85. This AVERAGE should be calculated from @ least 5-7 BP readings taken @ different times of day on different days of week. You should not respond to isolated BP readings , but rather the AVERAGE for that week .Please bring your  blood pressure cuff to office visits to verify that it is reliable.It  can also be checked against the blood pressure device at the pharmacy. Finger or wrist cuffs are not dependable; an arm cuff is.   Miralax every 3 rd day as needed for constipation. Review and correct the record as indicated. Please share record with all medical staff seen.

## 2012-05-24 ENCOUNTER — Telehealth: Payer: Self-pay

## 2012-05-24 NOTE — Telephone Encounter (Signed)
Pt called stating her blood sugar has been elevated this am 230, yesterday 284, pt feels Lantus needs to be increase, please advise [phone].

## 2012-05-24 NOTE — Telephone Encounter (Signed)
Please increase lantus to 40 units qhs Please come in for an appointment < 1 week

## 2012-05-24 NOTE — Telephone Encounter (Signed)
Pt advised of results and states an understanding, please call with an appt.

## 2012-05-30 ENCOUNTER — Ambulatory Visit (INDEPENDENT_AMBULATORY_CARE_PROVIDER_SITE_OTHER): Payer: Medicare Other | Admitting: Endocrinology

## 2012-05-30 VITALS — BP 128/74 | HR 104 | Wt 264.0 lb

## 2012-05-30 DIAGNOSIS — E1129 Type 2 diabetes mellitus with other diabetic kidney complication: Secondary | ICD-10-CM

## 2012-05-30 DIAGNOSIS — N058 Unspecified nephritic syndrome with other morphologic changes: Secondary | ICD-10-CM

## 2012-05-30 DIAGNOSIS — IMO0002 Reserved for concepts with insufficient information to code with codable children: Secondary | ICD-10-CM

## 2012-05-30 DIAGNOSIS — E1165 Type 2 diabetes mellitus with hyperglycemia: Secondary | ICD-10-CM

## 2012-05-30 NOTE — Patient Instructions (Addendum)
please increase lantus back up to 50 units at bedtime.   Please make a follow-up appointment in 6 weeks.   check your blood sugar 2 times a day.  vary the time of day when you check, between before the 3 meals, and at bedtime.  also check if you have symptoms of your blood sugar being too high or too low.  please keep a record of the readings and bring it to your next appointment here.  please call us sooner if your blood sugar goes below 70, or if it stays over 200.

## 2012-05-30 NOTE — Progress Notes (Signed)
Subjective:    Patient ID: Valerie Stevenson, female    DOB: 1945-12-14, 67 y.o.   MRN: [id number]  HPI Pt returns for f/u of insulin-requiring DM (dx'ed; complicated by retinopathy and renal insufficiency).  She feels the celexa caused her cbg to go low.  Since she has been off it, she has increased her lantus back up to 40 units qhs.  she brings a record of her cbg's which i have reviewed today.  It varies from 73-200's, but most are in the 100's.   Past Medical History  Diagnosis Date  . Diabetes mellitus      IDDM;Dr Everardo All  . Toxic multinodular goiter     Dr Everardo All  . OSA (obstructive sleep apnea)     CPAP;Dr Clint Young  . Glaucoma(365)   . Anemia     iron deficinecy  . Asthma   . COPD (chronic obstructive pulmonary disease)   . Depression   . GERD (gastroesophageal reflux disease)   . Hyperlipidemia   . Hypertension   . Hyperthyroidism     in context of nodule; Tapazole therapy since 2007  . Skin cancer     basal cell  . Diverticulosis of colon 2009  . Urticaria, idiopathic   . Renal disease     insufficiency   -- saw Nephrologist  in [place]..hasn't seen one in 2-3 yrs  . Arthritis   . Eczema   . DM (diabetes mellitus)     Past Surgical History  Procedure Date  . Total abdominal hysterectomy w/ bilateral salpingoophorectomy     sister with ovarian cancer  . Replacement total knee 2003    left ; Dr Sullivan Lone; [place]  . Total knee arthroplasty 10/19/2011    Procedure: TOTAL KNEE ARTHROPLASTY;  Surgeon: Loreta Ave, MD;  Location: [facility] OR;  Service: Orthopedics;  Laterality: Right;  DR MURPHY WANTS 90 MINUTES FOR THIS CASE   . Colonoscopy 2009    History   Social History  . Marital Status: Married    Spouse Name: N/A    Number of Children: 2  . Years of Education: N/A   Occupational History  . RN-Retired    Social History Main Topics  . Smoking status: Never Smoker   . Smokeless tobacco: Never Used  . Alcohol Use: No  . Drug Use: No  .  Sexually Active: Not on file   Other Topics Concern  . Not on file   Social History Narrative  . No narrative on file    Current Outpatient Prescriptions on File Prior to Visit  Medication Sig Dispense Refill  . albuterol (PROVENTIL HFA;VENTOLIN HFA) 108 (90 BASE) MCG/ACT inhaler Inhale 2 puffs into the lungs every 4 (four) hours as needed for wheezing.  1 Inhaler  2  . amLODipine (NORVASC) 5 MG tablet 1 by mouth twice daily   60 tablet  3  . aspirin 81 MG tablet Take 81 mg by mouth daily.      . B-D UF III MINI PEN NEEDLES 31G X 5 MM MISC As directed      . buPROPion (WELLBUTRIN XL) 150 MG 24 hr tablet TAKE ONE TABLET BY MOUTH EVERY DAY  90 tablet  1  . carvedilol (COREG) 3.125 MG tablet Take 1 tablet (3.125 mg total) by mouth 2 (two) times daily with a meal.  60 tablet  3  . cloNIDine (CATAPRES) 0.2 MG tablet 1 bid  120 tablet  3  . insulin aspart (NOVOLOG FLEXPEN) 100 UNIT/ML injection  Inject subcutaneously three times a day (just before each meal) 30-20-35 units  30 mL  4  . insulin glargine (LANTUS) 100 UNIT/ML injection Inject 50 Units into the skin at bedtime.       . Insulin Syringe-Needle U-100 (INSULIN SYRINGE 1CC/31GX5/16") 31G X 5/16" 1 ML MISC Use as directed once daily  100 each  3  . LORazepam (ATIVAN) 0.5 MG tablet Take 1 tablet (0.5 mg total) by mouth at bedtime as needed.  30 tablet  0  . pravastatin (PRAVACHOL) 40 MG tablet TAKE ONE TABLET BY MOUTH EVERY DAY  30 tablet  5  . Probiotic Product (PROBIOTIC PO) Take 1 tablet by mouth daily.      . ranitidine (ZANTAC) 150 MG tablet TAKE ONE TABLET BY MOUTH TWICE DAILY  60 tablet  5  . RELION PEN NEEDLE 31G/8MM 31G X 8 MM MISC THREE TIMES DAILY  100 each  10  . RELION ULTRA THIN PLUS LANCETS MISC As directed       . spironolactone (ALDACTONE) 50 MG tablet TAKE ONE TABLET BY MOUTH EVERY DAY  30 tablet  5    Allergies  Allergen Reactions  . Latex     rash  . Penicillins     REACTION: Hives  . Shellfish-Derived Products      Anaphylactoid reaction   . Zestril (Lisinopril)     ? hives  . Morphine And Related Itching    Family History  Problem Relation Age of Onset  . Coronary artery disease Father     no MI  . Stroke Mother     in 16s  . Diabetes Mother   . Ovarian cancer Sister     twin sibling  . Alcohol abuse Maternal Grandfather   . Hypothyroidism Sister     twin  . Goiter Sister     resected  . COPD Father   . Breast cancer Sister     BP 128/74  Pulse 104  Wt 264 lb (119.75 kg)  SpO2 97%  Review of Systems denies recent hypoglycemia    Objective:   Physical Exam VITAL SIGNS:  See vs page GENERAL: no distress PSYCH: Alert and oriented x 3.  Does not appear anxious nor depressed.     Assessment & Plan:  DM: the reason for her decreased insulin requirement is unclear.  At any rate, her requirement seems to be increasing again.

## 2012-06-01 ENCOUNTER — Telehealth: Payer: Self-pay | Admitting: *Deleted

## 2012-06-01 NOTE — Telephone Encounter (Signed)
Spoke to Dr hopper who advise Pt to take 2 tab of the coreg every 12 hours and continue to monitor BP. Pt advise if she become symptomatic with CP, SOB, numbness or tingling of extremity, blurred vision or headache she needs to be evaluated in ED immediately. Pt ok and verbalized understanding.

## 2012-06-01 NOTE — Telephone Encounter (Signed)
Pt states that [facility] got BP at 220/70. Pt states that she has a slight headache but denies any other symptoms.

## 2012-06-01 NOTE — Telephone Encounter (Signed)
RETURED your call pt stated she went to [facility] bp with their cuff as well as the manual she bought is 2240/40--pt was advised no available appts and se needed tp speak to another triage nurse in Felicia's absence -- pt refused please call pt back at hom

## 2012-06-01 NOTE — Telephone Encounter (Signed)
Pt states that BP today 191/75, 220/90, and at 11 am today 161/61, BP on yesterday 173/74, 165/62. Pt denies any symptoms as of now. Offer Pt appt decline to come in stating that she was just in. Pt indicated that she will go to fire station to have them check BP and call us back with reading because she know her BP monitor is a little off. .Please advise

## 2012-06-01 NOTE — Telephone Encounter (Signed)
Verify BP cuff valid @ [facility]

## 2012-06-04 ENCOUNTER — Encounter: Payer: Self-pay | Admitting: Internal Medicine

## 2012-06-04 ENCOUNTER — Ambulatory Visit (INDEPENDENT_AMBULATORY_CARE_PROVIDER_SITE_OTHER): Payer: Medicare Other | Admitting: Internal Medicine

## 2012-06-04 ENCOUNTER — Telehealth: Payer: Self-pay | Admitting: *Deleted

## 2012-06-04 VITALS — BP 190/78 | HR 82 | Temp 98.0°F | Wt 264.0 lb

## 2012-06-04 DIAGNOSIS — N289 Disorder of kidney and ureter, unspecified: Secondary | ICD-10-CM | POA: Insufficient documentation

## 2012-06-04 DIAGNOSIS — I10 Essential (primary) hypertension: Secondary | ICD-10-CM

## 2012-06-04 MED ORDER — CARVEDILOL 12.5 MG PO TABS: ORAL_TABLET | ORAL | Status: DC

## 2012-06-04 NOTE — Patient Instructions (Addendum)
Complete your present supply of carvedilol 3.125 mg by taking 3 pills twice a day. When completed; fill the new prescription for 12.5 mg twice a day. This can be titrated to 12.5 mg one half every 12 hours after 72 hours if blood pressure remains over 140/90 on average. After 72 hours additionally; it can be increased to 2 pills twice a day for a maximum dose of 25 mg twice a day if needed. Minimal Blood Pressure Goal= AVERAGE < 140/90;  Ideal is an AVERAGE < 135/85. This AVERAGE should be calculated from @ least 5-7 BP readings taken @ different times of day on different days of week. You should not respond to isolated BP readings , but rather the AVERAGE for that week .Please bring your  blood pressure cuff to office visits to verify that it is reliable.It  can also be checked against the blood pressure device at the pharmacy. Finger or wrist cuffs are not dependable; an arm cuff is.

## 2012-06-04 NOTE — Telephone Encounter (Signed)
Message copied by Verdene Rio on Mon Jun 04, 2012 10:08 AM ------      Message from: Arvilla Meres P      Created: Mon Jun 04, 2012  9:15 AM      Contact: [phone]       Pt states her bp is still high. She would like to speak with you to give you her readings. ------

## 2012-06-04 NOTE — Telephone Encounter (Signed)
Pt called stating BP is still elevated Saturday 06-01-12 9:30 am155/63, 1:10 pm200/80, 7 pm177/75  220/70,yesterday 220/90 164/52 today 184/70,200/70. Pt denies any symptoms as of now other than a slight headache. Pt scheduled for OV today but advise if she becomes symptpmatic prior to appt she must be seen in ED. Pt ok verbalized understanding

## 2012-06-04 NOTE — Telephone Encounter (Signed)
See other encounter.

## 2012-06-04 NOTE — Progress Notes (Signed)
  Subjective:    Patient ID: Valerie Stevenson, female    DOB: Sep 08, 1945, 67 y.o.   MRN: [id number]  HPI  HYPERTENSION follow-up:  Home blood pressure range: 142/56-220/90 Patient is compliant with medications No adverse effects noted from medication No exercise program  To start Weight Watchers'; on low-salt diet.    Some mild dull headache in anterior temples, not treated.        Review of Systems  No chest pain, palpitations, dyspnea, claudication,edema or paroxysmal nocturnal dyspnea described. No significant lightheadedness,  epistaxis, or syncope.     Objective:   Physical Exam Well-nourished & in no acute distress; weight excess  No carotid bruits are present.No neck pain distention present at 10 - 15 degrees. Thyroid normal to palpation  Heart rhythm and rate are normal with Grade 1/6 systolic murmur.  Chest: scattered low grade wheezing & rhonchi; no increased work of breathing  There is no evidence of aortic aneurysm or renal artery bruits  Abdomen soft with no organomegaly or masses but protuberant. No HJR  No clubbing, cyanosis or edema present.  Pedal pulses are intact   No ischemic skin changes are present .   Alert and oriented.          Assessment & Plan:

## 2012-06-04 NOTE — Assessment & Plan Note (Signed)
Carvedilol will be titrated every 72 hours to a maximum dose of 25 mg twice a day if blood pressure fails to be 140/90 or less on average.

## 2012-06-05 ENCOUNTER — Encounter: Payer: Self-pay | Admitting: Endocrinology

## 2012-06-05 ENCOUNTER — Other Ambulatory Visit: Payer: Self-pay | Admitting: Internal Medicine

## 2012-06-13 ENCOUNTER — Telehealth: Payer: Self-pay | Admitting: *Deleted

## 2012-06-13 NOTE — Telephone Encounter (Signed)
Pt states that since restarting coreg she has developed whelps and itching on her hands. Pt notes that whelps appear on various parts of her body periodically. Pt indicated that she has not had any symptoms today. Pt states that symptoms began once she increases to the 3 tab (3.125 mg) Pt is now on the 12.5 mg. Pt denies any other lifestyle changes that could have cause the symptoms

## 2012-06-13 NOTE — Telephone Encounter (Signed)
Discuss with patient, appt schedule

## 2012-06-13 NOTE — Telephone Encounter (Signed)
Needs appt with ALL meds & supplements

## 2012-06-14 ENCOUNTER — Ambulatory Visit (INDEPENDENT_AMBULATORY_CARE_PROVIDER_SITE_OTHER): Payer: Medicare Other | Admitting: Internal Medicine

## 2012-06-14 ENCOUNTER — Encounter: Payer: Self-pay | Admitting: Internal Medicine

## 2012-06-14 VITALS — BP 150/64 | HR 77 | Temp 97.3°F | Wt 266.0 lb

## 2012-06-14 DIAGNOSIS — I10 Essential (primary) hypertension: Secondary | ICD-10-CM

## 2012-06-14 DIAGNOSIS — L501 Idiopathic urticaria: Secondary | ICD-10-CM

## 2012-06-14 MED ORDER — HYDROXYZINE HCL 10 MG PO TABS: 10.0000 mg | ORAL_TABLET | ORAL | Status: DC | PRN

## 2012-06-14 NOTE — Patient Instructions (Signed)
Hydroxyzine 10 mg one  every 4 hours as needed for itching in place of Benadryl. Caution :May cause drowsiness or affect balance

## 2012-06-14 NOTE — Progress Notes (Signed)
  Subjective:    Patient ID: Valerie Stevenson, female    DOB: 1945/07/07, 67 y.o.   MRN: [id number]  HPI  Skin lesions began 2 weeks ago as hands itching with intermittent hives Treatment with Benadryl  & Gold Bond was partially effective.  She has been evaluated by Dr. Jetty Duhamel as well as at [facility] for the urticaria. No definite etiology has been established  She questioned whether this exacerbation may have been related to carvedilol which is being titrated for blood pressure control.On this medication blood pressures have ranged from 140/57 to a high of 190/70. Prior to titration of the carvedilol systolics were as high as the 200s.  There was no other associated trigger or context such as exposure to animals, new medications, clothing or topical cosmetics .  Patient is compliant with BP  medications                Review of Systems   No chest pain, palpitations, dyspnea, claudication,edema or paroxysmal nocturnal dyspnea described  No significant lightheadedness, headache, epistaxis, or syncope                                          Objective:   Physical Exam  Appears well-nourished & in no acute distress. Weight excess  No carotid bruits are present.No neck pain distention present at 10 - 15 degrees. Thyroid normal to palpation  Split S1. Grade 1/2 systolic murmur at the base. Blood pressure with her cuff was 189/70; our office cuff reveal blood pressure 150/64.   Chest is clear with no increased work of breathing  There is no evidence of aortic aneurysm or renal artery bruits  Abdomen protuberant but soft with no organomegaly or masses. No HJR  No clubbing, cyanosis or edema present.  Pedal pulses are intact   No ischemic skin changes are present . Eczematoid lesions over the lower extremities  Negative dermatographia with stimulation of the skin of the abdomen  Alert and oriented.          Assessment & Plan:   #1 urticaria, idiopathic  #2 hypertension; control still suboptimal  Plan: Hydroxyzine 10 mg every 6 hours as needed for the urticaria. Reassessment by [facility] may be necessary.  She'll continue to titrate the Carvedilol which is now 12.5 mg twice a day up to a max of 25 mg twice a day . Titration will be by 1/ 2 pill twice a day every 7-10 days if the average blood pressure is  over 140/90.

## 2012-06-18 ENCOUNTER — Encounter: Payer: Self-pay | Admitting: Internal Medicine

## 2012-06-20 ENCOUNTER — Ambulatory Visit (INDEPENDENT_AMBULATORY_CARE_PROVIDER_SITE_OTHER): Payer: Medicare Other | Admitting: Internal Medicine

## 2012-06-20 ENCOUNTER — Encounter: Payer: Self-pay | Admitting: Internal Medicine

## 2012-06-20 VITALS — BP 140/62 | HR 74 | Ht 66.0 in | Wt 270.8 lb

## 2012-06-20 DIAGNOSIS — L501 Idiopathic urticaria: Secondary | ICD-10-CM

## 2012-06-20 DIAGNOSIS — G4733 Obstructive sleep apnea (adult) (pediatric): Secondary | ICD-10-CM

## 2012-06-20 MED ORDER — MONTELUKAST SODIUM 10 MG PO TABS: 10.0000 mg | ORAL_TABLET | Freq: Every day | ORAL | Status: DC

## 2012-06-20 NOTE — Assessment & Plan Note (Signed)
Dermatologist dx'd "eczema", i.e. Dry thickened skin/ dyshydrosis. I don't see hives now.  Plan- Allegra/ Zantac/ Singulair

## 2012-06-20 NOTE — Progress Notes (Signed)
Patient ID: Valerie Stevenson, female    DOB: 1945/05/27, 67 y.o.   MRN: [id number]  HPI 09/02/10- 54 yoF w/ hx urticaria complicated by asthma/ COPD, food allergy to shellfish, RAI for thyroid Last here 08/17/10 for initial visit for Dr Linna Darner, with onset of hives 3 weeks previously. Discussed chronic lisinopril. Better with hives but still getting some especially as she gets up in the morning- shower makes them more visible. She has been taking Allegra 180, with Zantac 300mg  bid. She remains itchy. Had tried doxepin 10- no help and didn't like it. Dr Linna Darner gave Ativan.  Allergy profile-  IgE 296, but no specific elevations, WBC 16,600; No eos. No overt infection.  12/15/10- 35 yoF w/ hx urticaria complicated by asthma/ COPD, food allergy to shellfish, RAI for thyroid Continues to complain of itching with visible hives every day. Dr Loanne Drilling felt her thyroid hormone levels were not the problem, but the underlying cause of her thyroid disease may be related. She has tried zantac now at 150 twice daily, with Singulair. She came off allegra, which wasn't helping. Doxepin didn't help and not well tolerated. She has used Ativan to reduce irritability but it makes her too sleepy. I explained that urticaria may not be allergic, may be related to any of her medications, and may be related to her thyroid or diabetes disorders. There has not been evidence of malignancy. CT chest was negative except for thyroid nodule. Sedimentation rate was elevated at 57. ANA was negative. IgE was elevated at 266.1 but food profile was negative. Specific seafood profile showed mild elevations for crab and shrimp. IgG food profile results are being sought.  12/29/20-64 yoF w/ hx urticaria complicated by asthma/ COPD, food allergy to shellfish, RAI for thyroid Here today for new problem- concern of sleep disorder Documented hx OSA on CPAP. Needs to establish here for update Rx for machine and supplies. Her current CPAP at 10 is used  all night, every night. She had a sleep study in [place] to establish the diagnosis. Her current machine is 80 or 68 years old and needs to be replaced. She has not been using a humidifier. Nasal pillows mask. She is satisfied with her treatment and only asks that we help get her replacement machine and maintain followup through this office. Sleep hygiene: Bedtime between 9 and 10 PM, sleep latency 10-15 minutes, waking 2 or 3 times each night for finally up between 7:30 and 8 AM. Weight has gone up 25 pounds in the last 2 years. She feels restless at night and will get up to sit on the side of the bed. This is mostly do to her ongoing urticaria. No history of ENT surgery.  Problem of urticaria is ongoing. She is pending allergy evaluation at [facility]. I suspect relationship to her thyroid disease. Other medical problems include kidney disease elevated cholesterol diabetes palpitations and hypertension. There is no history of heart or lung disease otherwise. Lab- IgG food allergy panel showed elevations for milk and egg white. She understands this information is not firmly validated, and is of value only to raise awareness. She will try to avoid these for a month and see if that affects her urticaria. She does still believe that some food is the basis for her hives, although we have discussed other etiologies.   06/21/11- 60 yoF w/ hx urticaria complicated by asthma/ COPD, food allergy to shellfish, RAI for thyroid, OSA/ CPAP Likes her new CPAP machine which is smaller.  Wears it all night every night and can't sleep without it. Using nasal pillows mask/Advanced/10 cwp. Has not had urticaria in the past month, off meds.. I had sent her to [facility] where evaluation there put her on similar medication-Allegra, Singulair, Zantac. He told her basically the same things that I have told her.  06/20/12-  66 yoF w/ hx urticaria complicated by asthma/ COPD, food allergy to shellfish, RAI for thyroid, OSA/  CPAP FOLLOWS FOR: wears CPAP every night for about 8 hours; has not worn in past 2 nights as she has stayed at the hospital with her sister in law due to surgery; would also like to know what could be causing her hives/itching--questionable reaction to new medicine (coreg) Little sleep for last 2 nights, helping family after knee surgery. Has had to miss CPAP 10/ Advanced for this.Very tired. Hives/ itching flared again after restarted on Coreg 3-4 weeks ago, which seemed to trigger before. We and [facility] had not found specific trigger 2 years ago. Dermatologist told her she has eczema- take Aveeno.   Review of Systems- see HPI Constitutional:   No weight loss, night sweats,  Fevers, chills, +fatigue, lassitude. HEENT:   No headaches,  Difficulty swallowing,  Tooth/dental problems,  Sore throat,                No sneezing,, ear ache, nasal congestion, post nasal drip,  CV:  No chest pain,  Orthopnea, PND, swelling in lower extremities, anasarca, dizziness, palpitations GI  No heartburn, indigestion, abdominal pain, nausea, vomiting,  Resp: No shortness of breath with exertion or at rest.  No excess mucus, no productive cough,  No non-productive cough,  No coughing up of blood.  No change in color of mucus.  No wheezing.  Skin:HPI and prior visits. GU: . MS:  No joint pain or swelling.  No decreased range of motion.  No back pain. Psych:  No change in mood or affect. No depression or anxiety.  No memory loss.  Objective:   Physical Exam General- Alert, Oriented, Affect-appropriate, Distress- none acute  Obese, looks tired Skin- +dry skin w/o obvious hives. Excoriated arms. Lymphadenopathy- none Head- atraumatic            Eyes- Gross vision intact, PERRLA, conjunctivae clear secretions            Ears- Hearing, canals normal            Nose- Clear, no-Septal dev, mucus, polyps, erosion, perforation             Throat- Mallampati III , mucosa clear , drainage- none, tonsils- atrophic Neck-  flexible , trachea midline, no stridor , thyroid nl, carotid no bruit Chest - symmetrical excursion , unlabored           Heart/CV- RRR , no murmur , no gallop  , no rub, nl s1 s2                           - JVD- none , edema- none, stasis changes- none, varices- none           Lung- clear to P&A, wheeze- none, cough- none , dullness-none, rub- none           Chest wall-  Abd- Br/ Gen/ Rectal- Not done, not indicated Extrem- cyanosis- none, clubbing, none, atrophy- none, strength- nl Neuro- grossly intact to observation except chronic droop right corner of mouth

## 2012-06-20 NOTE — Patient Instructions (Addendum)
Now is a good time to be taking triple therapy- Zyrtec or Allegra/ Zantac/ Singulair for the itching and hives  Script for singulair  sent    Get back to your CPAP 10/Advanced as soon as you can.

## 2012-06-22 ENCOUNTER — Telehealth: Payer: Self-pay

## 2012-06-22 NOTE — Telephone Encounter (Signed)
Called patient to inquire about the GI hx she has from 2 years ago at unidentified Dr.'s office; Patient said she was seen and had procedures done at Dr. Manuella Ghazi' office in [place].  She stated she had signed a medical release form earlier this week while there seeing another doctor.  I told her I would call them to follow up on records we would need for her appointment.

## 2012-06-22 NOTE — Assessment & Plan Note (Signed)
Realizes the importance of CPAP again after skipping it for 2 nights while staying overnight with a family member at the hospital. Has not been able to lose weight. She is encouraged to restart CPAP now. The pressure has been adequate

## 2012-06-25 ENCOUNTER — Telehealth: Payer: Self-pay | Admitting: Internal Medicine

## 2012-06-25 MED ORDER — EPINEPHRINE 0.3 MG/0.3ML IJ DEVI: 0.3000 mg | Freq: Once | INTRAMUSCULAR | Status: AC

## 2012-06-25 NOTE — Telephone Encounter (Signed)
Per CY-add Vitamin D3 4000IU daily-can get over the counter; this has been shown to help with her symptoms.

## 2012-06-25 NOTE — Telephone Encounter (Signed)
Per Florentina Addison, yes; we can send rx for epipen.    Rx has been sent.  Pt aware.

## 2012-06-25 NOTE — Telephone Encounter (Signed)
Called, spoke wit pt.  Informed her of below recs per Dr. Maple Hudson.  She verbalized understanding of this and will pick up vit d3 4000iu at pharmacy [facility].  Pt states she was given rx for epipen a few years ago but the epipen is now expired.  This is not on her current med list.  Dr. Maple Hudson, are you ok with refilling this for pt.  Pls advise.  Thank you.  [facility] in [place]

## 2012-06-25 NOTE — Telephone Encounter (Signed)
Called and spoke with pt and she stated that she is not any better since last week.  Itching is all over and keeps moving.  meds are not helping.  Pt stated that she has been going crazy all weekend. Pt is requesting further recs from CY.  Please advise. Thanks   Allergies  Allergen Reactions  . Latex     rash  . Penicillins     REACTION: Hives  . Shellfish-Derived Products     Anaphylactoid reaction   . Zestril (Lisinopril)     ? hives  . Morphine And Related Itching

## 2012-06-26 ENCOUNTER — Ambulatory Visit: Payer: Medicare Other | Admitting: Endocrinology

## 2012-06-28 ENCOUNTER — Telehealth: Payer: Self-pay

## 2012-06-28 NOTE — Telephone Encounter (Signed)
Let patient know that a final decision had not yet been made regarding inclement weather for in the morning.  I advised her to either watch News 2 or call our weather line (which I provided) before coming to her 9:30am appointment.  Patient agreed.

## 2012-06-29 ENCOUNTER — Ambulatory Visit: Payer: Medicare Other | Admitting: Internal Medicine

## 2012-07-02 ENCOUNTER — Telehealth: Payer: Self-pay | Admitting: *Deleted

## 2012-07-02 NOTE — Telephone Encounter (Signed)
Pt is still having hives and itching and swelling that started when she started taking Coreg. Pt is requesting an alternative medication.

## 2012-07-02 NOTE — Telephone Encounter (Signed)
Spoke with patient, patient went to [facility] in [place]. Patient was given benadryl, pepcid, and solu medrol in house.  Patient was given rx for Prednisone 10 mg: 2 by mouth x 5 days and Vistrail 50 mg 1 by mouth at bedtime and Benadryl as needed.   Patient aware of Dr.Hopper's instructions and verbalized understanding, patient to call if B/P above 140/90 on average

## 2012-07-02 NOTE — Telephone Encounter (Signed)
Hold Coreg & monitor the blood pressure as well as the urticaria. If the urticaria does not resolve; resume the carvedilol after 48-72 hours if blood pressure remains over 140/90 on average.

## 2012-07-03 ENCOUNTER — Telehealth: Payer: Self-pay | Admitting: Endocrinology

## 2012-07-03 NOTE — Telephone Encounter (Signed)
Pt seen in ER last night d/t hives. Was given steroid injection and started on Prednisone. Her blood sugar is running high this morning. Please call patient, does she need to adjust insulin dose and how much? Please call at [phone] / Sherri S.

## 2012-07-03 NOTE — Telephone Encounter (Signed)
Called and discussed with patient. She is on a regimen of Lantus 60 units, and NovoLog 30-20-35 units. She has been having hives for the last 2 weeks, finally went to the emergency room yesterday before lunch, received a Solu-Medrol injection, Pepcid, Benadryl, and prednisone pack, to take 40 mg daily x5 days. She noticed that her sugars were high last night, at 282 (she did not take her lunch insulin because she was in the emergency room at that time - She does mention that her sugars were 51 in the emergency room). She took 65 units of Lantus last night, and took 40 units of NovoLog with dinner. This morning her sugar was 482, and she did not have breakfast yet.   I advised her to keep the Lantus to 65 units, and increase the NovoLog to 40-25-40 units. Also advised her to try to stay as hydrated as she can. I advised her to call me today or tomorrow before lunch if her sugars stay high. I also advised her to keep increasing the insulin if her sugars stay high, but not forget to decrease the doses when she stops the prednisone.

## 2012-07-04 ENCOUNTER — Telehealth: Payer: Self-pay | Admitting: Internal Medicine

## 2012-07-04 NOTE — Telephone Encounter (Signed)
I reviewed EMR to verify that patient was seen in ER, no records. I called patient and LM for her to return call when available to verify that she did seek medical attention

## 2012-07-04 NOTE — Telephone Encounter (Signed)
Valerie Stevenson, her urticarial illness continues to flare. What would be the next step? Obviously the steroids are going to exacerbate her metabolic syndrome/diabetes. Thanks for any suggestions. [employer]

## 2012-07-04 NOTE — Telephone Encounter (Signed)
Could she tolerate being off her BP meds and welbutrin sequentially, at least for 10 days or so each, one at a time?

## 2012-07-04 NOTE — Telephone Encounter (Signed)
Patient Information:  Caller Name: Meral  Phone: [phone]  Patient: Valerie Stevenson  Gender: Female  DOB: 1945/11/26  Age: 67 Years  PCP: Marga Melnick  Office Follow Up:  Does the office need to follow up with this patient?: No- caller will go to Emergency Department.  Instructions For The Office: N/A  RN Note:  Caller seen in the Emergency Department on 07/02/12 for hives and facial swelling. Caller has stopped Coreg as directed. Caller has noticed shortness of breath since being given the Prednisone and Benadryl in the hospital for hives. Shortness of breath is worse when up and moving. Shortness of breath when at rest. Caller is answering with short responses. Caller states she can barely talk in a complete sentence. Caller states intermittent wheezing. Wheezing is present currently. Pulse is at 77 at 1244 on 07/04/12. Hives are still present and itching. [facility].  Symptoms  Reason For Call & Symptoms: shortness of breath  Reviewed Health History In EMR: Yes  Reviewed Medications In EMR: Yes  Reviewed Allergies In EMR: Yes  Reviewed Surgeries / Procedures: Yes  Date of Onset of Symptoms: 07/02/2012  Guideline(s) Used:  Breathing Difficulty  Disposition Per Guideline:   Go to ED Now  Reason For Disposition Reached:   Moderate difficulty breathing (e.g., speaks in phrases, SOB even at rest, pulse 100-120) of new onset or worse than normal  Advice Given:  Call Back If:  You become worse.

## 2012-07-05 NOTE — Telephone Encounter (Signed)
Spoke with patient, patient in ER at [facility], patient off Carvedilol since Monday. Patient aware to d/c wellbutrin. Patient scheduled follow-up appointment for Monday 07/09/2012.

## 2012-07-05 NOTE — Telephone Encounter (Signed)
The carvedilol was to have been decreased to half of the prior dose twice a day. I recommend stopping it. Also stop the Wellbutrin. These are the recommendations of Dr. Maple Hudson, allergist. Monitor blood pressure off the carvedilol.

## 2012-07-05 NOTE — Telephone Encounter (Signed)
Patient states she is returned Chrae's call. She is at [facility] and can be reached on her mobile # [phone].

## 2012-07-06 ENCOUNTER — Encounter: Payer: Self-pay | Admitting: Lab

## 2012-07-09 ENCOUNTER — Ambulatory Visit (INDEPENDENT_AMBULATORY_CARE_PROVIDER_SITE_OTHER): Payer: Medicare Other | Admitting: Internal Medicine

## 2012-07-09 ENCOUNTER — Encounter: Payer: Self-pay | Admitting: Internal Medicine

## 2012-07-09 VITALS — BP 144/68 | HR 98 | Temp 98.5°F | Wt 262.0 lb

## 2012-07-09 DIAGNOSIS — E0590 Thyrotoxicosis, unspecified without thyrotoxic crisis or storm: Secondary | ICD-10-CM

## 2012-07-09 DIAGNOSIS — I10 Essential (primary) hypertension: Secondary | ICD-10-CM

## 2012-07-09 DIAGNOSIS — J45909 Unspecified asthma, uncomplicated: Secondary | ICD-10-CM

## 2012-07-09 DIAGNOSIS — N289 Disorder of kidney and ureter, unspecified: Secondary | ICD-10-CM

## 2012-07-09 DIAGNOSIS — L501 Idiopathic urticaria: Secondary | ICD-10-CM

## 2012-07-09 LAB — BASIC METABOLIC PANEL
GFR: 37.36 mL/min — ABNORMAL LOW (ref >60.00)
Potassium: 4.7 mEq/L (ref 3.5–5.1)
Sodium: 133 mEq/L — ABNORMAL LOW (ref 135–145)

## 2012-07-09 MED ORDER — NEBIVOLOL HCL 5 MG PO TABS: 5.0000 mg | ORAL_TABLET | Freq: Every day | ORAL | Status: DC

## 2012-07-09 NOTE — Assessment & Plan Note (Signed)
Medical Center reassessment would be indicated if urticaria persists. The burst of steroids being employed for this and reactive airways disease is aggravating her diabetic control

## 2012-07-09 NOTE — Patient Instructions (Addendum)
Bystolic 5 mg one half pill daily x5 days then one daily using the samples. It will be titrated every 7-10 days by half a pill if blood pressure remains over 140/90 on average.Do not go beyond 10 mg daily max. Please sign a release of records to [employer] for records related to free T4 results

## 2012-07-09 NOTE — Assessment & Plan Note (Signed)
As noted spironolactone will be discontinued. Potassium and creatinine will be rechecked

## 2012-07-09 NOTE — Assessment & Plan Note (Signed)
Asthma is quiescent at this time; Bystolic preferred to carvedilol.

## 2012-07-09 NOTE — Progress Notes (Signed)
  Subjective:    Patient ID: Valerie Stevenson, female    DOB: 01-19-1946, 67 y.o.   MRN: [id number]  HPI The emergency room records 3/12-3/13/14 were reviewed. She was treated for acute asthma exacerbation and ongoing flare of urticaria. This was actually her second visit in 48 hour period. The urticaria and reactive airways symptoms were persisting despite prednisone, Zantac, Allegra.  She did receive Solu-Medrol parenterally.  She received IV fluids for acute renal insufficiency; serial creatinines did improve. At time of discharge it was 1.38. Aldactone was decreased from 50 mg to one half pill daily. Additionally she received Kayexalate for hyperkalemia. Potassium did drop to 4.8.    Her blood sugars have been poorly controlled in the context of the steroids burst. A1c in the urgent in the emergency room 7.8; fasting glucose was 299 at discharge.  Her TSH was found to be markedly low at 0.230;  free T4 was ordered; those results are not back yet.   Review of Systems The carvedilol was discontinued as it was concerned that this might be causing urticaria. The symptoms have persisted despite coming off of medication 3/10. Off the medication her blood pressures range from 140/60-159/72. Average has been 150-65.     Objective:   Physical Exam Gen.: appears fatigued but adequately nourished in appearance. Alert, appropriate and cooperative throughout exam.  Head: Normocephalic without obvious abnormalities  Eyes: No corneal or conjunctival inflammation noted.  Nose: External nasal exam reveals no deformity or inflammation. Nasal mucosa are dry. No lesions or exudates noted.  Mouth: Oral mucosa and oropharynx reveal no lesions or exudates. Teeth in good repair.Oropharynx crowded w/o angioedema Neck: No deformities, masses, or tenderness noted.  Thyroid L lobe > R. Lungs: Normal respiratory effort; chest expands symmetrically. Lungs are clear to auscultation without rales, wheezes, or  increased work of breathing.Dcreasd BS Heart: Normal rate and rhythm. Normal S1 and S2. No gallop, click, or rub. S4 w/o murmur. Abdomen: Bowel sounds normal; abdomen soft and nontender. No masses, organomegaly or hernias noted.No renal artery bruits. Protuberant                               Musculoskeletal/extremities: Accentuated curvature of upper thoracic  Spine. No clubbing, cyanosis, edema, or significant extremity  deformity noted. Joints normal. Nail health good. Able to lie down & sit up w/o help.  Vascular: Carotid, radial artery, dorsalis pedis and  posterior tibial pulses are full and equal. No bruits present. Neurologic: Alert and oriented x3. Deep tendon reflexes symmetrical and normal.           Skin: Scattered urticarial lesions on the face,neck,trunk and extremities Lymph: No cervical, axillary lymphadenopathy present. Psych: Mood and affect are flat but normally interactive                                                                                        Assessment & Plan:

## 2012-07-09 NOTE — Assessment & Plan Note (Signed)
07/04/12 TSH was 0.230. Free T4 from emergency room is pending

## 2012-07-09 NOTE — Assessment & Plan Note (Signed)
Carvedilol and is not appear to be playing a role in her urticaria; but it might aggravate reactive airways disease.Bystolic will be substituted.  Spironolactone will be discontinued because of renal insufficiency and hyperkalemia

## 2012-07-11 NOTE — Progress Notes (Signed)
  Subjective:    Patient ID: Valerie Stevenson, female    DOB: Jul 19, 1945, 67 y.o.   MRN: [id number]  HPI   Additional FAXed records from [facility] 07/05/12 were reviewed. White count was 26,400. As noted she has been on steroids orally and parenterally. D-dimer was high normal at 225; normal is less than 230. Creatinine 1.38; BUN 37; and GFR 38. CK 84; CK-MB 3.1; troponin less than 0.010. Her BNP was 3373. Chest x-ray revealed no cardiopulmonary process. As previously noted TSH was 0.23; free T4 was 1.13. A1c was 7.8%.    Review of Systems     Objective:   Physical Exam        Assessment & Plan:  It is difficult to explain the elevated BNP; clinically there was no sign of heart failure and chest x-ray revealed no acute process.  BNP should be monitored.

## 2012-07-13 ENCOUNTER — Ambulatory Visit (INDEPENDENT_AMBULATORY_CARE_PROVIDER_SITE_OTHER): Payer: Medicare Other | Admitting: Endocrinology

## 2012-07-13 ENCOUNTER — Encounter: Payer: Self-pay | Admitting: Endocrinology

## 2012-07-13 VITALS — BP 136/82 | HR 80 | Wt 264.0 lb

## 2012-07-13 DIAGNOSIS — N058 Unspecified nephritic syndrome with other morphologic changes: Secondary | ICD-10-CM

## 2012-07-13 DIAGNOSIS — E785 Hyperlipidemia, unspecified: Secondary | ICD-10-CM

## 2012-07-13 DIAGNOSIS — E1129 Type 2 diabetes mellitus with other diabetic kidney complication: Secondary | ICD-10-CM

## 2012-07-13 DIAGNOSIS — IMO0002 Reserved for concepts with insufficient information to code with codable children: Secondary | ICD-10-CM

## 2012-07-13 LAB — LDL CHOLESTEROL, DIRECT: Direct LDL: 108.8 mg/dL

## 2012-07-13 LAB — LIPID PANEL
Total CHOL/HDL Ratio: 6
Triglycerides: 228 mg/dL — ABNORMAL HIGH (ref 0.0–149.0)

## 2012-07-13 NOTE — Patient Instructions (Addendum)
Please come back for a follow-up appointment in 3 months.   Please continue the same insulins.   check your blood sugar 2 times a day.  vary the time of day when you check, between before the 3 meals, and at bedtime.  also check if you have symptoms of your blood sugar being too high or too low.  please keep a record of the readings and bring it to your next appointment here.  please call us sooner if your blood sugar goes below 70, or if it stays over 200.

## 2012-07-13 NOTE — Progress Notes (Signed)
  Subjective:    Patient ID: Valerie Stevenson, female    DOB: [birth year], 67 y.o.   MRN: [id number]  HPI Pt returns for f/u of insulin-requiring DM (dx'ed; complicated by retinopathy and renal insufficiency).  She has been back on steroids for urticaria.  she brings a record of her cbg's which i have reviewed today.  It is improved since she started the solumedrol, but it stil varies from 65-330.  There is no trend throughout the day.   Review of Systems Denies weight change    Objective:   Physical Exam VITAL SIGNS:  See vs page GENERAL: no distress Pulses: dorsalis pedis intact bilat.   Feet: no deformity.  no ulcer on the feet.  feet are of normal color and temp.  no edema.  There is an eczematous rash on the legs. Neuro: sensation is intact to touch on the feet.    (pt says a1c was 7.8 last week).    Assessment & Plan:  In view if steroid rx, this a1c is pretty good

## 2012-07-17 ENCOUNTER — Other Ambulatory Visit: Payer: Self-pay | Admitting: Endocrinology

## 2012-07-18 ENCOUNTER — Other Ambulatory Visit (INDEPENDENT_AMBULATORY_CARE_PROVIDER_SITE_OTHER): Payer: Medicare Other

## 2012-07-18 DIAGNOSIS — D72829 Elevated white blood cell count, unspecified: Secondary | ICD-10-CM

## 2012-07-18 LAB — CBC WITH DIFFERENTIAL/PLATELET
Basophils Absolute: 0.1 10*3/uL (ref 0.0–0.1)
Eosinophils Relative: 5 % (ref 0.0–5.0)
Hemoglobin: 12.4 g/dL (ref 12.0–15.0)
Lymphocytes Relative: 20 % (ref 12.0–46.0)
Monocytes Relative: 8 % (ref 3.0–12.0)
Neutro Abs: 6.2 10*3/uL (ref 1.4–7.7)
RDW: 14.1 % (ref 11.5–14.6)
WBC: 9.3 10*3/uL (ref 4.5–10.5)

## 2012-07-18 LAB — BRAIN NATRIURETIC PEPTIDE: Pro B Natriuretic peptide (BNP): 198 pg/mL — ABNORMAL HIGH (ref 0.0–100.0)

## 2012-07-29 ENCOUNTER — Other Ambulatory Visit: Payer: Self-pay | Admitting: Adult Health

## 2012-07-30 ENCOUNTER — Telehealth: Payer: Self-pay | Admitting: *Deleted

## 2012-07-30 ENCOUNTER — Encounter: Payer: Self-pay | Admitting: Endocrinology

## 2012-07-30 DIAGNOSIS — I10 Essential (primary) hypertension: Secondary | ICD-10-CM

## 2012-07-30 MED ORDER — FUROSEMIDE 40 MG PO TABS: 40.0000 mg | ORAL_TABLET | Freq: Every day | ORAL | Status: DC | PRN

## 2012-07-30 MED ORDER — NEBIVOLOL HCL 5 MG PO TABS: 10.0000 mg | ORAL_TABLET | Freq: Every day | ORAL | Status: DC

## 2012-07-30 NOTE — Telephone Encounter (Addendum)
Pt states that she is up to 10 mg of the bystolic but BP remains high at average 160/60. Pt also c/o some legs swelling especially in the left leg. Pt also c/o some episode of SOB with exertion but feels it is due to the fluid. Pt denies any SOB at the moment or at rest. Pt would like to know if it would be possible to get a fluid pill. Pt notes that she has been trying to drink at least 40 ounces of water a day and when she is able to it does get rid of some of the fluid.Marland KitchenPlease advise

## 2012-07-30 NOTE — Telephone Encounter (Signed)
Discuss with patient, Rx sent. 

## 2012-07-30 NOTE — Telephone Encounter (Signed)
And furosemide 40 mg one daily as needed for edema. This should help lower blood pressure also. Please refill x2

## 2012-07-31 ENCOUNTER — Telehealth: Payer: Self-pay | Admitting: Endocrinology

## 2012-07-31 MED ORDER — INSULIN GLARGINE 100 UNIT/ML ~~LOC~~ SOLN: 50.0000 [IU] | Freq: Every day | SUBCUTANEOUS | Status: DC

## 2012-07-31 MED ORDER — "INSULIN SYRINGE-NEEDLE U-100 31G X 5/16"" 1 ML MISC": Status: DC

## 2012-07-31 NOTE — Telephone Encounter (Signed)
Needs Lantus refill called in at [facility] / Sherri S.

## 2012-08-01 ENCOUNTER — Encounter: Payer: Self-pay | Admitting: Internal Medicine

## 2012-08-01 ENCOUNTER — Telehealth: Payer: Self-pay | Admitting: Internal Medicine

## 2012-08-01 ENCOUNTER — Ambulatory Visit (INDEPENDENT_AMBULATORY_CARE_PROVIDER_SITE_OTHER): Payer: Medicare Other | Admitting: Internal Medicine

## 2012-08-01 VITALS — BP 142/60 | HR 60 | Ht 65.0 in | Wt 270.1 lb

## 2012-08-01 DIAGNOSIS — K219 Gastro-esophageal reflux disease without esophagitis: Secondary | ICD-10-CM

## 2012-08-01 DIAGNOSIS — R159 Full incontinence of feces: Secondary | ICD-10-CM

## 2012-08-01 DIAGNOSIS — K589 Irritable bowel syndrome without diarrhea: Secondary | ICD-10-CM

## 2012-08-01 NOTE — Telephone Encounter (Signed)
rec'd from Dr. Manuella Ghazi forward 2 pages to Dr.Perry 08/01/12

## 2012-08-01 NOTE — Patient Instructions (Addendum)
We will get a copy of your last colonoscopy from Dr. Yetta Barre.  Dr. Marina Goodell will decide how to proceed after reviewing it.

## 2012-08-01 NOTE — Progress Notes (Signed)
HISTORY OF PRESENT ILLNESS:  Valerie Stevenson is a 67 y.o. female with multiple medical problems including morbid obesity, COPD, sleep apnea, diabetes, hypertension, hyperthyroidism, IBS, and GERD she is new to the practice. She refers herself today, upon the recommendation of the patient, regarding change in her bowel habits. Patient reports problems with flatus and leakage of fluid or mucus per rectum 2 weeks prior to Christmas. He equates this with discontinuing probiotics. Also did constipated for which she has tried Senokot, MiraLax, and Metamucil. Problem continued for a few weeks. However she's had no difficulties over the past month. She is resume probiotics. She denies rectal bleeding. No abdominal pain. She does have occasional reflux symptoms. She is taking Zantac for reflux. She was wondering if she is due for colonoscopy. We did not have her colonoscopy report at the time of the office encounter, but did receive it after she left the office. Patient had complete colonoscopy 10/18/2004 with Dr. Ander Purpura in [facility]. The exam was carried out to the level of the ileocecal valve. She was noted to have scattered left-sided diverticulosis and redundant colon. No polyps.  REVIEW OF SYSTEMS:  All non-GI ROS negative except for anxiety, arthritis, back pain, depression, fatigue, heart murmur, itching, shortness of breath, swelling of ankles and feet, skin rash  Past Medical History  Diagnosis Date  . Diabetes mellitus      IDDM;Dr Everardo All  . Toxic multinodular goiter     Dr Everardo All  . OSA (obstructive sleep apnea)     CPAP;Dr Clint Young  . Glaucoma(365)   . Anemia     iron deficinecy  . Asthma   . COPD (chronic obstructive pulmonary disease)   . Depression   . GERD (gastroesophageal reflux disease)   . Hyperlipidemia   . Hypertension   . Hyperthyroidism     in context of nodule; Tapazole therapy since 2007  . Skin cancer     basal cell  . Diverticulosis of colon  2009  . Urticaria, idiopathic   . Renal disease     insufficiency   -- saw Nephrologist  in [place]..hasn't seen one in 2-3 yrs  . Arthritis   . Eczema   . DM (diabetes mellitus)   . Anxiety   . IBS (irritable bowel syndrome)     Past Surgical History  Procedure Laterality Date  . Total abdominal hysterectomy w/ bilateral salpingoophorectomy      sister with ovarian cancer  . Replacement total knee Left 2003    left ; Dr Sullivan Lone; [place]  . Total knee arthroplasty  10/19/2011    Procedure: TOTAL KNEE ARTHROPLASTY;  Surgeon: Loreta Ave, MD;  Location: [facility] OR;  Service: Orthopedics;  Laterality: Right;  DR MURPHY WANTS 90 MINUTES FOR THIS CASE   . Colonoscopy  2009    Social History Valerie Stevenson  reports that she has never smoked. She has never used smokeless tobacco. She reports that she does not drink alcohol or use illicit drugs.  family history includes Alcohol abuse in her maternal grandfather; Breast cancer in her sister; COPD in her father; Coronary artery disease in her father; Diabetes in her mother; Goiter in her sister; Hypothyroidism in her sister; Irritable bowel syndrome in her sister; Ovarian cancer in her sister; and Stroke in her mother.  Allergies  Allergen Reactions  . Latex     rash  . Penicillins     REACTION: Hives  . Shellfish-Derived Products     Anaphylactoid reaction   .  Zestril (Lisinopril)     ? hives  . Morphine And Related Itching       PHYSICAL EXAMINATION: Vital signs: BP 142/60  Pulse 60  Ht 5\' 5"  (1.651 m)  Wt 270 lb 2 oz (122.528 kg)  BMI 44.95 kg/m2 General: Markedly obese, Well-developed, well-nourished, no acute distress HEENT: Sclerae are anicteric, conjunctiva pink. Oral mucosa intact Lungs: Clear Heart: Regular Abdomen: soft, obese, nontender, nondistended, no obvious ascites, no peritoneal signs, normal bowel sounds. No organomegaly. Extremities: No edema Psychiatric: alert and oriented x3.  Cooperative   ASSESSMENT:  #1. Transient fecal leakage. Resolved. No other worrisome features. Colonoscopy to the ileocecal valve and 2006 #2. GERD. #3. Multiple medical problems including morbid obesity   PLAN:  #1. Fiber daily #2. Reflux precautions with attention to weight loss #3. Routine repeat screening colonoscopy in 2016. Patient will be contacted. Interval GI followup as needed. #4. Resume care with PCP

## 2012-08-02 ENCOUNTER — Telehealth: Payer: Self-pay

## 2012-08-02 NOTE — Telephone Encounter (Signed)
Pt aware and recall entered  

## 2012-08-02 NOTE — Telephone Encounter (Signed)
Message copied by Chrystie Nose on Thu Aug 02, 2012  9:06 AM ------      Message from: Hilarie Fredrickson      Created: Wed Aug 01, 2012  4:49 PM      Regarding: Colonoscopy report received       Bonita Quin,            Please let the patient know that I received her colonoscopy report from [facility]. The examination was performed 10/18/2004. No polyps. Please make a recall colonoscopy for June 2016. Please let her know. Thanks ------

## 2012-08-06 ENCOUNTER — Telehealth: Payer: Self-pay

## 2012-08-06 DIAGNOSIS — I10 Essential (primary) hypertension: Secondary | ICD-10-CM

## 2012-08-06 NOTE — Telephone Encounter (Signed)
Message sent to my voicemail:  1.) Patient c/o swelling in feet and legs 2.) SOB 3.) B/P still elevated, top number 170's and 160's  Patient is taking fluid pill as needed and taking increased dose of B/P med (10 mg Bystolic) Patient is not sure what the next step is in treating her symptoms since she was already seen on 07/06/2012.  Hopp please advise

## 2012-08-06 NOTE — Telephone Encounter (Signed)
I reviewed her chart;increase Bystolic to 15 mg daily; if the edema persists, check BMET, BNP and chest x-ray

## 2012-08-07 MED ORDER — NEBIVOLOL HCL 5 MG PO TABS: 15.0000 mg | ORAL_TABLET | Freq: Every day | ORAL | Status: DC

## 2012-08-07 NOTE — Telephone Encounter (Signed)
Discuss with patient Rx sent   

## 2012-08-09 ENCOUNTER — Other Ambulatory Visit: Payer: Self-pay | Admitting: *Deleted

## 2012-08-09 MED ORDER — INSULIN ASPART 100 UNIT/ML ~~LOC~~ SOLN: SUBCUTANEOUS | Status: DC

## 2012-08-09 MED ORDER — INSULIN PEN NEEDLE 31G X 8 MM MISC: Status: AC

## 2012-08-09 MED ORDER — "INSULIN SYRINGE-NEEDLE U-100 31G X 5/16"" 1 ML MISC": Status: DC

## 2012-08-13 ENCOUNTER — Encounter: Payer: Self-pay | Admitting: Internal Medicine

## 2012-08-15 ENCOUNTER — Telehealth: Payer: Self-pay

## 2012-08-15 ENCOUNTER — Telehealth: Payer: Self-pay | Admitting: Endocrinology

## 2012-08-15 DIAGNOSIS — I10 Essential (primary) hypertension: Secondary | ICD-10-CM

## 2012-08-15 MED ORDER — INSULIN GLARGINE 100 UNIT/ML ~~LOC~~ SOLN: 50.0000 [IU] | Freq: Every day | SUBCUTANEOUS | Status: DC

## 2012-08-15 MED ORDER — CLONIDINE HCL 0.1 MG PO TABS: 0.1000 mg | ORAL_TABLET | Freq: Two times a day (BID) | ORAL | Status: DC

## 2012-08-15 MED ORDER — AMLODIPINE BESYLATE 5 MG PO TABS: ORAL_TABLET | ORAL | Status: DC

## 2012-08-15 MED ORDER — PRAVASTATIN SODIUM 40 MG PO TABS: ORAL_TABLET | ORAL | Status: DC

## 2012-08-15 MED ORDER — NEBIVOLOL HCL 5 MG PO TABS: 15.0000 mg | ORAL_TABLET | Freq: Every day | ORAL | Status: DC

## 2012-08-15 NOTE — Telephone Encounter (Signed)
Left message to call office to advise Pt of hopp comments, Rx sent.

## 2012-08-15 NOTE — Telephone Encounter (Signed)
Monitor on 0.1 mg twice a day; increase if blood pressure stays greater than 140/90 on average

## 2012-08-15 NOTE — Telephone Encounter (Signed)
Hopp please advise if patient to take 0.1 mg BID (see patient response)

## 2012-08-15 NOTE — Telephone Encounter (Signed)
Pt called, Novalog refill has been recv'd by [facility] but they have not gotten a refill for Lanutus. Pt says we need to send this in ASAP. / Sherri

## 2012-08-15 NOTE — Telephone Encounter (Signed)
Pt called back stating that she had originally been on the 0.1mg  BID. Pt had seen Dr. Drue Novel before Christmas and states he had increased dose to 0.2mg  daily. Thought that at the last visit Dr. Alwyn Ren decreased med back to 0.1mg  BID.

## 2012-08-15 NOTE — Telephone Encounter (Addendum)
Left message on voicemail for patient to return call and verify dose for clonidine. Med list states 0.2 mg and paper request(@ my desk) from [facility] is for 0.1 mg   Side Notes: Other requested rx's sent via paper fax back to [facility] (Pravastatin, Bystolic and Amlodipine)

## 2012-08-15 NOTE — Telephone Encounter (Signed)
Message copied by Maurice Small on Wed Aug 15, 2012  7:41 AM ------      Message from: Pecola Lawless      Created: Fri Aug 10, 2012  4:56 PM       Please see request for med refills. Please verify present dose of clonidine. EMR states 0.2 mg; request is for 0.1 mg. Thanks, Hopp ------

## 2012-08-16 ENCOUNTER — Ambulatory Visit (INDEPENDENT_AMBULATORY_CARE_PROVIDER_SITE_OTHER): Payer: Medicare Other | Admitting: Internal Medicine

## 2012-08-16 ENCOUNTER — Encounter: Payer: Self-pay | Admitting: Internal Medicine

## 2012-08-16 VITALS — BP 138/76 | HR 56 | Temp 98.2°F | Resp 14 | Wt 269.0 lb

## 2012-08-16 DIAGNOSIS — N289 Disorder of kidney and ureter, unspecified: Secondary | ICD-10-CM

## 2012-08-16 DIAGNOSIS — R799 Abnormal finding of blood chemistry, unspecified: Secondary | ICD-10-CM

## 2012-08-16 DIAGNOSIS — J45909 Unspecified asthma, uncomplicated: Secondary | ICD-10-CM

## 2012-08-16 DIAGNOSIS — I10 Essential (primary) hypertension: Secondary | ICD-10-CM

## 2012-08-16 DIAGNOSIS — R7989 Other specified abnormal findings of blood chemistry: Secondary | ICD-10-CM | POA: Insufficient documentation

## 2012-08-16 NOTE — Progress Notes (Signed)
  Subjective:    Patient ID: Valerie Stevenson, female    DOB: 18-Aug-1945, 67 y.o.   MRN: [id number]  HPI  The beta blocker Bystolic has been increased to 5 mg 3 pills daily; she continues to be on the alpha agent clonidine 0.1 mg twice a day; and a calcium channel blocker amlodipine 5 mg twice a day.  Blood pressure has ranged from 132/54 to 178/70. The high blood pressure was  before she taking her blood pressure medicines.  She cannot take ACE inhibitors because of urticaria; because of this angiotensin receptor blockers results would also be contraindicated. She has had some renal insufficiency on at least 2 occasions. Spironolactone was discontinued.   Review of Systems   She has had some increase in her RAD symptoms. She's had no recurrence of angioedema orsignificant hives.  On one occasiona she had some discolored sputum; this has not persisted. She denies fever, chills, or sweats. She's had some pleuritic type discomfort with respirations. With the possible allergy flare she's had some exertional dyspnea. She increased her albuterol with response; she used it 3-4 times yesterday.  She denies other chest pain, palpitations, edema, or paroxysmal nocturnal dyspnea.       Objective:   Physical Exam Appears  well-nourished & in no acute distress  No carotid bruits are present.No neck pain distention present at 10 - 15 degrees. Thyroid normal to palpation  Heart rhythm  normal ; rate slow with no  Gallop. Grade 1/2- 1 over 6 systolic murmur Chest is clear with no increased work of breathing. No rub. Decreased BS w/o wheezing  There is no evidence of aortic aneurysm or renal artery bruits  Abdomen soft with no organomegaly or masses. No HJR. Central weight excess  No clubbing, cyanosis or edema present. Homan's negative  Pedal pulses are intact   No ischemic skin changes are present . Nails healthy.    Alert and oriented. Strength, tone normal          Assessment &  Plan:

## 2012-08-16 NOTE — Assessment & Plan Note (Signed)
Exacerbation with pollen exposure. Albuterol use has increased; this has been partially beneficial. Symbicort trial will be initiated.

## 2012-08-16 NOTE — Assessment & Plan Note (Signed)
Blood pressure control has improved with the present regimen.  As noted under "allergies" she is unable to take ACE inhibitors, angiotensin receptor blockers, or spironolactone.  I have emphasized treating the average rather than isolated blood pressure recordings.  She is having no significant edema with the amlodipine.

## 2012-08-16 NOTE — Patient Instructions (Addendum)
Please review the medication list in the After Visit Summary provided.Please verify the medication name (this may be  brand or generic) & correct dosage. Write the name of the prescribing physician to the right of the medication and share this with all medical staff seen at each appointment. This will help provide continuity of care; help optimize therapeutic interventions;and help prevent drug:drug adverse reaction.  Symbicort one - two inhalations every 12 hours; gargle and spit after use

## 2012-08-19 ENCOUNTER — Emergency Department (HOSPITAL_BASED_OUTPATIENT_CLINIC_OR_DEPARTMENT_OTHER): Payer: Medicare Other

## 2012-08-19 ENCOUNTER — Encounter (HOSPITAL_BASED_OUTPATIENT_CLINIC_OR_DEPARTMENT_OTHER): Payer: Self-pay | Admitting: *Deleted

## 2012-08-19 ENCOUNTER — Inpatient Hospital Stay (HOSPITAL_BASED_OUTPATIENT_CLINIC_OR_DEPARTMENT_OTHER)
Admission: EM | Admit: 2012-08-19 | Discharge: 2012-08-26 | DRG: 194 | Disposition: A | Discharge status: Home / Self Care | Payer: Medicare Other | Attending: Internal Medicine | Admitting: Internal Medicine

## 2012-08-19 DIAGNOSIS — E785 Hyperlipidemia, unspecified: Secondary | ICD-10-CM | POA: Diagnosis present

## 2012-08-19 DIAGNOSIS — J449 Chronic obstructive pulmonary disease, unspecified: Secondary | ICD-10-CM | POA: Diagnosis present

## 2012-08-19 DIAGNOSIS — Z96659 Presence of unspecified artificial knee joint: Secondary | ICD-10-CM

## 2012-08-19 DIAGNOSIS — E0590 Thyrotoxicosis, unspecified without thyrotoxic crisis or storm: Secondary | ICD-10-CM | POA: Diagnosis present

## 2012-08-19 DIAGNOSIS — Y95 Nosocomial condition: Secondary | ICD-10-CM

## 2012-08-19 DIAGNOSIS — J45909 Unspecified asthma, uncomplicated: Secondary | ICD-10-CM

## 2012-08-19 DIAGNOSIS — K219 Gastro-esophageal reflux disease without esophagitis: Secondary | ICD-10-CM | POA: Diagnosis present

## 2012-08-19 DIAGNOSIS — Z85828 Personal history of other malignant neoplasm of skin: Secondary | ICD-10-CM

## 2012-08-19 DIAGNOSIS — I10 Essential (primary) hypertension: Secondary | ICD-10-CM | POA: Diagnosis present

## 2012-08-19 DIAGNOSIS — J4489 Other specified chronic obstructive pulmonary disease: Secondary | ICD-10-CM | POA: Diagnosis present

## 2012-08-19 DIAGNOSIS — F329 Major depressive disorder, single episode, unspecified: Secondary | ICD-10-CM | POA: Diagnosis present

## 2012-08-19 DIAGNOSIS — R059 Cough, unspecified: Secondary | ICD-10-CM

## 2012-08-19 DIAGNOSIS — N289 Disorder of kidney and ureter, unspecified: Secondary | ICD-10-CM | POA: Diagnosis present

## 2012-08-19 DIAGNOSIS — Z794 Long term (current) use of insulin: Secondary | ICD-10-CM

## 2012-08-19 DIAGNOSIS — D509 Iron deficiency anemia, unspecified: Secondary | ICD-10-CM | POA: Diagnosis present

## 2012-08-19 DIAGNOSIS — J4520 Mild intermittent asthma, uncomplicated: Secondary | ICD-10-CM | POA: Diagnosis present

## 2012-08-19 DIAGNOSIS — Z6841 Body Mass Index (BMI) 40.0 and over, adult: Secondary | ICD-10-CM

## 2012-08-19 DIAGNOSIS — G4733 Obstructive sleep apnea (adult) (pediatric): Secondary | ICD-10-CM | POA: Diagnosis present

## 2012-08-19 DIAGNOSIS — J189 Pneumonia, unspecified organism: Principal | ICD-10-CM | POA: Diagnosis present

## 2012-08-19 DIAGNOSIS — N058 Unspecified nephritic syndrome with other morphologic changes: Secondary | ICD-10-CM | POA: Diagnosis present

## 2012-08-19 DIAGNOSIS — F411 Generalized anxiety disorder: Secondary | ICD-10-CM | POA: Diagnosis present

## 2012-08-19 DIAGNOSIS — IMO0002 Reserved for concepts with insufficient information to code with codable children: Secondary | ICD-10-CM

## 2012-08-19 DIAGNOSIS — E1129 Type 2 diabetes mellitus with other diabetic kidney complication: Secondary | ICD-10-CM | POA: Diagnosis present

## 2012-08-19 DIAGNOSIS — R05 Cough: Secondary | ICD-10-CM

## 2012-08-19 DIAGNOSIS — N189 Chronic kidney disease, unspecified: Secondary | ICD-10-CM

## 2012-08-19 DIAGNOSIS — K589 Irritable bowel syndrome without diarrhea: Secondary | ICD-10-CM | POA: Diagnosis present

## 2012-08-19 DIAGNOSIS — F3289 Other specified depressive episodes: Secondary | ICD-10-CM | POA: Diagnosis present

## 2012-08-19 DIAGNOSIS — H409 Unspecified glaucoma: Secondary | ICD-10-CM | POA: Diagnosis present

## 2012-08-19 DIAGNOSIS — L501 Idiopathic urticaria: Secondary | ICD-10-CM

## 2012-08-19 DIAGNOSIS — E1165 Type 2 diabetes mellitus with hyperglycemia: Secondary | ICD-10-CM | POA: Diagnosis present

## 2012-08-19 LAB — CBC WITH DIFFERENTIAL/PLATELET
Basophils Absolute: 0.1 10*3/uL (ref 0.0–0.1)
Basophils Relative: 0 % (ref 0–1)
Eosinophils Absolute: 0.5 10*3/uL (ref 0.0–0.7)
Eosinophils Relative: 2 % (ref 0–5)
Lymphocytes Relative: 11 % — ABNORMAL LOW (ref 12–46)
MCH: 30 pg (ref 26.0–34.0)
MCHC: 32.5 g/dL (ref 30.0–36.0)
MCV: 92.3 fL (ref 78.0–100.0)
Monocytes Absolute: 1.5 10*3/uL — ABNORMAL HIGH (ref 0.1–1.0)
Platelets: 271 10*3/uL (ref 150–400)
RDW: 13.7 % (ref 11.5–15.5)
WBC: 20.6 10*3/uL — ABNORMAL HIGH (ref 4.0–10.5)

## 2012-08-19 LAB — CREATININE, SERUM: GFR calc Af Amer: 37 mL/min — ABNORMAL LOW (ref >90)

## 2012-08-19 LAB — BASIC METABOLIC PANEL
BUN: 30 mg/dL — ABNORMAL HIGH (ref 6–23)
Calcium: 9.6 mg/dL (ref 8.4–10.5)
Creatinine, Ser: 1.7 mg/dL — ABNORMAL HIGH (ref 0.50–1.10)
GFR calc Af Amer: 35 mL/min — ABNORMAL LOW (ref >90)

## 2012-08-19 LAB — CBC
MCHC: 33.2 g/dL (ref 30.0–36.0)
MCV: 88.5 fL (ref 78.0–100.0)
Platelets: 273 10*3/uL (ref 150–400)
RDW: 13.9 % (ref 11.5–15.5)
WBC: 20.8 10*3/uL — ABNORMAL HIGH (ref 4.0–10.5)

## 2012-08-19 LAB — TROPONIN I: Troponin I: <0.3 ng/mL (ref <0.30)

## 2012-08-19 MED ORDER — PROBIOTIC PO CAPS: 1.0000 | ORAL_CAPSULE | Freq: Every morning | ORAL | Status: DC

## 2012-08-19 MED ORDER — IPRATROPIUM BROMIDE 0.02 % IN SOLN
0.5000 mg | Freq: Once | RESPIRATORY_TRACT | Status: AC
  Administered 2012-08-19: 0.5 mg via RESPIRATORY_TRACT
  Filled 2012-08-19: qty 2.5

## 2012-08-19 MED ORDER — INSULIN ASPART 100 UNIT/ML ~~LOC~~ SOLN
6.0000 [IU] | Freq: Three times a day (TID) | SUBCUTANEOUS | Status: DC
  Administered 2012-08-20 – 2012-08-26 (×19): 6 [IU] via SUBCUTANEOUS

## 2012-08-19 MED ORDER — SODIUM CHLORIDE 0.9 % IV SOLN
INTRAVENOUS | Status: DC
  Administered 2012-08-19 – 2012-08-20 (×3): via INTRAVENOUS

## 2012-08-19 MED ORDER — SODIUM CHLORIDE 0.9 % IV SOLN: INTRAVENOUS | Status: DC

## 2012-08-19 MED ORDER — ASPIRIN 81 MG PO TABS: 81.0000 mg | ORAL_TABLET | Freq: Every day | ORAL | Status: DC

## 2012-08-19 MED ORDER — MONTELUKAST SODIUM 10 MG PO TABS
10.0000 mg | ORAL_TABLET | Freq: Every day | ORAL | Status: DC
  Administered 2012-08-19 – 2012-08-25 (×7): 10 mg via ORAL
  Filled 2012-08-19 (×8): qty 1

## 2012-08-19 MED ORDER — ONDANSETRON HCL 4 MG PO TABS: 4.0000 mg | ORAL_TABLET | Freq: Four times a day (QID) | ORAL | Status: DC | PRN

## 2012-08-19 MED ORDER — LEVOFLOXACIN IN D5W 750 MG/150ML IV SOLN: 750.0000 mg | Freq: Once | INTRAVENOUS | Status: DC

## 2012-08-19 MED ORDER — ONDANSETRON HCL 4 MG/2ML IJ SOLN
4.0000 mg | Freq: Four times a day (QID) | INTRAMUSCULAR | Status: DC | PRN
  Administered 2012-08-20 – 2012-08-22 (×2): 4 mg via INTRAVENOUS
  Filled 2012-08-19 (×2): qty 2

## 2012-08-19 MED ORDER — DOCUSATE SODIUM 100 MG PO CAPS
100.0000 mg | ORAL_CAPSULE | Freq: Two times a day (BID) | ORAL | Status: DC
  Administered 2012-08-19 – 2012-08-26 (×14): 100 mg via ORAL
  Filled 2012-08-19 (×15): qty 1

## 2012-08-19 MED ORDER — SIMVASTATIN 20 MG PO TABS
20.0000 mg | ORAL_TABLET | Freq: Every day | ORAL | Status: DC
  Administered 2012-08-20 – 2012-08-23 (×4): 20 mg via ORAL
  Filled 2012-08-19 (×5): qty 1

## 2012-08-19 MED ORDER — ALBUTEROL SULFATE HFA 108 (90 BASE) MCG/ACT IN AERS
2.0000 | INHALATION_SPRAY | RESPIRATORY_TRACT | Status: DC | PRN
  Filled 2012-08-19: qty 6.7

## 2012-08-19 MED ORDER — DEXTROSE 5 % IV SOLN
500.0000 mg | INTRAVENOUS | Status: DC
  Administered 2012-08-19 – 2012-08-21 (×3): 500 mg via INTRAVENOUS
  Filled 2012-08-19 (×4): qty 500

## 2012-08-19 MED ORDER — CLONIDINE HCL 0.1 MG PO TABS
0.1000 mg | ORAL_TABLET | Freq: Two times a day (BID) | ORAL | Status: DC
  Administered 2012-08-19 – 2012-08-23 (×8): 0.1 mg via ORAL
  Filled 2012-08-19 (×9): qty 1

## 2012-08-19 MED ORDER — INSULIN ASPART 100 UNIT/ML ~~LOC~~ SOLN
0.0000 [IU] | Freq: Three times a day (TID) | SUBCUTANEOUS | Status: DC
  Administered 2012-08-20 (×3): 4 [IU] via SUBCUTANEOUS
  Administered 2012-08-21: 7 [IU] via SUBCUTANEOUS
  Administered 2012-08-21 – 2012-08-23 (×8): 4 [IU] via SUBCUTANEOUS
  Administered 2012-08-24: 3 [IU] via SUBCUTANEOUS
  Administered 2012-08-24: 7 [IU] via SUBCUTANEOUS
  Administered 2012-08-24 – 2012-08-25 (×2): 4 [IU] via SUBCUTANEOUS
  Administered 2012-08-25 (×2): 7 [IU] via SUBCUTANEOUS
  Administered 2012-08-26: 4 [IU] via SUBCUTANEOUS

## 2012-08-19 MED ORDER — BIOTENE DRY MOUTH MT LIQD
15.0000 mL | Freq: Two times a day (BID) | OROMUCOSAL | Status: DC
  Administered 2012-08-19 – 2012-08-26 (×14): 15 mL via OROMUCOSAL

## 2012-08-19 MED ORDER — INSULIN GLARGINE 100 UNIT/ML ~~LOC~~ SOLN
50.0000 [IU] | Freq: Every day | SUBCUTANEOUS | Status: DC
  Administered 2012-08-19 – 2012-08-20 (×2): 50 [IU] via SUBCUTANEOUS
  Filled 2012-08-19 (×3): qty 0.5

## 2012-08-19 MED ORDER — SACCHAROMYCES BOULARDII 250 MG PO CAPS: 250.0000 mg | ORAL_CAPSULE | Freq: Two times a day (BID) | ORAL | Status: DC

## 2012-08-19 MED ORDER — CHOLECALCIFEROL 100 MCG (4000 UT) PO CAPS: ORAL_CAPSULE | Freq: Every day | ORAL | Status: DC

## 2012-08-19 MED ORDER — VITAMIN D3 25 MCG (1000 UNIT) PO TABS
1000.0000 [IU] | ORAL_TABLET | Freq: Every day | ORAL | Status: DC
  Administered 2012-08-20 – 2012-08-26 (×7): 1000 [IU] via ORAL
  Filled 2012-08-19 (×7): qty 1

## 2012-08-19 MED ORDER — AMLODIPINE BESYLATE 5 MG PO TABS
5.0000 mg | ORAL_TABLET | Freq: Every day | ORAL | Status: DC
  Administered 2012-08-20 – 2012-08-23 (×4): 5 mg via ORAL
  Filled 2012-08-19 (×4): qty 1

## 2012-08-19 MED ORDER — DEXTROSE 5 % IV SOLN
1.0000 g | INTRAVENOUS | Status: DC
  Administered 2012-08-19 – 2012-08-21 (×3): 1 g via INTRAVENOUS
  Filled 2012-08-19 (×5): qty 10

## 2012-08-19 MED ORDER — VANCOMYCIN HCL IN DEXTROSE 1-5 GM/200ML-% IV SOLN
1000.0000 mg | Freq: Once | INTRAVENOUS | Status: DC
  Filled 2012-08-19: qty 200

## 2012-08-19 MED ORDER — ASPIRIN EC 81 MG PO TBEC
81.0000 mg | DELAYED_RELEASE_TABLET | Freq: Every day | ORAL | Status: DC
  Administered 2012-08-20 – 2012-08-24 (×5): 81 mg via ORAL
  Filled 2012-08-19 (×5): qty 1

## 2012-08-19 MED ORDER — FAMOTIDINE 20 MG PO TABS
20.0000 mg | ORAL_TABLET | Freq: Two times a day (BID) | ORAL | Status: DC
  Administered 2012-08-19 – 2012-08-26 (×14): 20 mg via ORAL
  Filled 2012-08-19 (×15): qty 1

## 2012-08-19 MED ORDER — LEVOFLOXACIN IN D5W 750 MG/150ML IV SOLN
750.0000 mg | Freq: Once | INTRAVENOUS | Status: AC
  Administered 2012-08-19: 750 mg via INTRAVENOUS
  Filled 2012-08-19: qty 150

## 2012-08-19 MED ORDER — ALBUTEROL SULFATE (5 MG/ML) 0.5% IN NEBU
5.0000 mg | INHALATION_SOLUTION | Freq: Once | RESPIRATORY_TRACT | Status: AC
  Administered 2012-08-19: 5 mg via RESPIRATORY_TRACT
  Filled 2012-08-19: qty 1

## 2012-08-19 MED ORDER — HYDROXYZINE HCL 10 MG PO TABS
10.0000 mg | ORAL_TABLET | ORAL | Status: DC | PRN
  Filled 2012-08-19: qty 1

## 2012-08-19 MED ORDER — HEPARIN SODIUM (PORCINE) 5000 UNIT/ML IJ SOLN
5000.0000 [IU] | Freq: Three times a day (TID) | INTRAMUSCULAR | Status: DC
  Administered 2012-08-19 – 2012-08-26 (×19): 5000 [IU] via SUBCUTANEOUS
  Filled 2012-08-19 (×23): qty 1

## 2012-08-19 MED ORDER — NEBIVOLOL HCL 5 MG PO TABS
15.0000 mg | ORAL_TABLET | Freq: Every day | ORAL | Status: DC
  Administered 2012-08-20 – 2012-08-26 (×7): 15 mg via ORAL
  Filled 2012-08-19 (×7): qty 1

## 2012-08-19 MED ORDER — INSULIN ASPART 100 UNIT/ML ~~LOC~~ SOLN
0.0000 [IU] | Freq: Every day | SUBCUTANEOUS | Status: DC
  Administered 2012-08-20: 4 [IU] via SUBCUTANEOUS
  Administered 2012-08-23: 23:00:00 via SUBCUTANEOUS
  Administered 2012-08-24: 2 [IU] via SUBCUTANEOUS

## 2012-08-19 MED ORDER — LORAZEPAM 0.5 MG PO TABS
0.5000 mg | ORAL_TABLET | ORAL | Status: DC | PRN
  Administered 2012-08-20 – 2012-08-25 (×5): 0.5 mg via ORAL
  Filled 2012-08-19 (×5): qty 1

## 2012-08-19 MED ORDER — ACETAMINOPHEN 325 MG PO TABS
650.0000 mg | ORAL_TABLET | Freq: Once | ORAL | Status: AC
  Administered 2012-08-19: 650 mg via ORAL
  Filled 2012-08-19: qty 2

## 2012-08-19 MED ORDER — LORATADINE 10 MG PO TABS
10.0000 mg | ORAL_TABLET | Freq: Every day | ORAL | Status: DC
  Administered 2012-08-20 – 2012-08-24 (×5): 10 mg via ORAL
  Filled 2012-08-19 (×5): qty 1

## 2012-08-19 MED ORDER — SACCHAROMYCES BOULARDII 250 MG PO CAPS
250.0000 mg | ORAL_CAPSULE | Freq: Every day | ORAL | Status: DC
  Administered 2012-08-20 – 2012-08-26 (×7): 250 mg via ORAL
  Filled 2012-08-19 (×7): qty 1

## 2012-08-19 MED ORDER — LORATADINE 10 MG PO TABS: 10.0000 mg | ORAL_TABLET | Freq: Every day | ORAL | Status: DC

## 2012-08-19 NOTE — ED Provider Notes (Signed)
History    This chart was scribed for Hilario Quarry, MD by Donne Anon, ED Scribe. This patient was seen in room MH02/MH02 and the patient's care was started at 1648.   CSN: [id number]  Arrival date & time 08/19/12  1624   First MD Initiated Contact with Patient 08/19/12 1648      Chief Complaint  Patient presents with  . Cough     The history is provided by the patient. No language interpreter was used.   Valerie Stevenson is a 67 y.o. female who presents to the Emergency Department complaining of 3 weeks of gradual onset, gradually worsening, constant cough (only 1x last week she coughed up brown sputum). She reports associated chills, diaphoresis, SOB and fever. She states that throughout the day her leg swelling gradually gets worse, but that it goes down when she sleeps. She denies CP or any other pain. She reports she has normal food and water intake. She was seen by her PCP 2 days ago and given another inhaler. She has tried Motrin with little relief. She had a BMP drawn in [facility] in March when she was admitted there 1 night for hives. She has tried her asthma with little relief.  She denies any history of smoking. She has a h/o asthma, DM, HTN, HLD, COPD and OSA. She has not been exposed to any sick individuals.  Past Medical History  Diagnosis Date  . Diabetes mellitus      IDDM;Dr Everardo All  . Toxic multinodular goiter     Dr Everardo All  . OSA (obstructive sleep apnea)     CPAP;Dr Clint Young  . Glaucoma(365)   . Anemia     iron deficinecy  . Asthma   . COPD (chronic obstructive pulmonary disease)   . Depression   . GERD (gastroesophageal reflux disease)   . Hyperlipidemia   . Hypertension   . Hyperthyroidism     in context of nodule; Tapazole therapy since 2007  . Skin cancer     basal cell  . Diverticulosis of colon 2009  . Urticaria, idiopathic   . Renal disease     insufficiency   -- saw Nephrologist  in [place]..hasn't seen one in 2-3 yrs  .  Arthritis   . Eczema   . DM (diabetes mellitus)   . Anxiety   . IBS (irritable bowel syndrome)     Past Surgical History  Procedure Laterality Date  . Total abdominal hysterectomy w/ bilateral salpingoophorectomy      sister with ovarian cancer  . Replacement total knee Left 2003    left ; Dr Sullivan Lone; [place]  . Total knee arthroplasty  10/19/2011    Procedure: TOTAL KNEE ARTHROPLASTY;  Surgeon: Loreta Ave, MD;  Location: [facility] OR;  Service: Orthopedics;  Laterality: Right;  DR MURPHY WANTS 90 MINUTES FOR THIS CASE   . Colonoscopy  2006    Next Due date 2016    Family History  Problem Relation Age of Onset  . Coronary artery disease Father     no MI  . Stroke Mother     in 22s  . Diabetes Mother   . Ovarian cancer Sister     twin sibling  . Alcohol abuse Maternal Grandfather   . Hypothyroidism Sister     twin  . Goiter Sister     resected  . COPD Father   . Breast cancer Sister   . Irritable bowel syndrome Sister     History  Substance Use Topics  . Smoking status: Never Smoker   . Smokeless tobacco: Never Used  . Alcohol Use: No     Review of Systems  Constitutional: Positive for fever, chills and diaphoresis.  Respiratory: Positive for cough and shortness of breath.   Cardiovascular: Positive for leg swelling. Negative for chest pain.  All other systems reviewed and are negative.    Allergies  Angiotensin receptor blockers; Latex; Penicillins; Shellfish-derived products; Zestril; Spironolactone; and Morphine and related  Home Medications   Current Outpatient Rx  Name  Route  Sig  Dispense  Refill  . albuterol (PROVENTIL HFA;VENTOLIN HFA) 108 (90 BASE) MCG/ACT inhaler   Inhalation   Inhale 2 puffs into the lungs every 4 (four) hours as needed for wheezing.   1 Inhaler   2   . amLODipine (NORVASC) 5 MG tablet      1 by mouth twice daily   90 tablet   0   . aspirin 81 MG tablet   Oral   Take 81 mg by mouth daily.         . B-D UF  III MINI PEN NEEDLES 31G X 5 MM MISC      As directed         . cetirizine (ZYRTEC) 10 MG tablet   Oral   Take 10 mg by mouth daily.         . Cholecalciferol 4000 UNITS CAPS   Oral   Take by mouth daily.         . cloNIDine (CATAPRES) 0.1 MG tablet   Oral   Take 1 tablet (0.1 mg total) by mouth 2 (two) times daily.   90 tablet   1   . EPINEPHrine (EPIPEN 2-PAK) 0.3 mg/0.3 mL DEVI   Intramuscular   Inject 0.3 mLs (0.3 mg total) into the muscle once.   1 Device   11   . fexofenadine (ALLEGRA) 180 MG tablet   Oral   Take 180 mg by mouth daily.         . furosemide (LASIX) 40 MG tablet   Oral   Take 1 tablet (40 mg total) by mouth daily as needed. For swelling   30 tablet   2   . hydrOXYzine (ATARAX/VISTARIL) 10 MG tablet   Oral   Take 1 tablet (10 mg total) by mouth every 4 (four) hours as needed for itching.   30 tablet   0   . insulin aspart (NOVOLOG FLEXPEN) 100 UNIT/ML injection      Inject subcutaneously three times a day (just before each meal) 30-20-35 units   90 mL   2   . insulin glargine (LANTUS) 100 UNIT/ML injection   Subcutaneous   Inject 0.5 mLs (50 Units total) into the skin at bedtime.   10 mL   3   . Insulin Pen Needle (RELION PEN NEEDLE 31G/8MM) 31G X 8 MM MISC      USE AS DIRECTED THREE TIMES DAILY   300 each   2   . Insulin Syringe-Needle U-100 (RELION INSULIN SYRINGE 1ML/31G) 31G X 5/16" 1 ML MISC      USE AS DIRECTED ONCE DAILY.   300 each   2   . LORazepam (ATIVAN) 0.5 MG tablet   Oral   Take 1 tablet (0.5 mg total) by mouth at bedtime as needed.   30 tablet   0   . montelukast (SINGULAIR) 10 MG tablet   Oral   Take 1 tablet (10  mg total) by mouth at bedtime.   30 tablet   11   . nebivolol (BYSTOLIC) 5 MG tablet   Oral   Take 3 tablets (15 mg total) by mouth daily.   90 tablet   0   . pravastatin (PRAVACHOL) 40 MG tablet      TAKE ONE TABLET BY MOUTH EVERY DAY   90 tablet   1   . Probiotic Product  (PROBIOTIC PO)   Oral   Take 1 tablet by mouth daily.         . ranitidine (ZANTAC) 150 MG tablet      TAKE ONE TABLET BY MOUTH TWICE DAILY   180 tablet   1   . RELION ULTRA THIN PLUS LANCETS MISC      As directed            BP 162/46  Pulse 76  Temp(Src) 100.2 F (37.9 C) (Oral)  Resp 20  Ht 5\' 6"  (1.676 m)  Wt 270 lb (122.471 kg)  BMI 43.6 kg/m2  SpO2 94%  Physical Exam  Nursing note and vitals reviewed. Constitutional: She is oriented to person, place, and time. She appears well-developed and well-nourished. No distress.  HENT:  Head: Normocephalic and atraumatic.  Eyes: Conjunctivae and EOM are normal.  Neck: Neck supple. No tracheal deviation present.  Cardiovascular: Normal rate, regular rhythm and normal heart sounds.   Pulmonary/Chest: Effort normal. No respiratory distress. She has decreased breath sounds.  Abdominal: Soft. There is no tenderness.  Musculoskeletal: Normal range of motion.  Neurological: She is alert and oriented to person, place, and time.  Skin: Skin is warm and dry.  Psychiatric: She has a normal mood and affect. Her behavior is normal.    ED Course  Procedures (including critical care time) DIAGNOSTIC STUDIES: Oxygen Saturation is 94% on room air, low by my interpretation.    COORDINATION OF CARE: 4:52 PM Discussed treatment plan which includes rechecking previous labs, a CXR, new labs and medication with pt at bedside and pt agreed to plan.   Meds ordered this encounter  Medications  . albuterol (PROVENTIL) (5 MG/ML) 0.5% nebulizer solution 5 mg    Sig:   . ipratropium (ATROVENT) nebulizer solution 0.5 mg    Sig:   . 0.9 %  sodium chloride infusion    Sig:   . acetaminophen (TYLENOL) tablet 650 mg    Sig:       Labs Reviewed - No data to display No results found.   No diagnosis found.  Date: 08/19/2012  Rate: 68  Rhythm: normal sinus rhythm  QRS Axis: normal  Intervals: normal  ST/T Wave abnormalities:  normal  Conduction Disutrbances: none  Narrative Interpretation: unremarkable       MDM  Dg Chest 2 View  08/19/2012  *RADIOLOGY REPORT*  Clinical Data: Cough, fever and shortness of breath for the past 2- 3 weeks.  CHEST - 2 VIEW  Comparison: Chest x-Alilah Mcmeans 10/13/2011.  Findings: There are multifocal areas of airspace disease in the lungs bilaterally, most confluent in the left upper lobe and in the superior segment of the right lower lobe.  Possible trace bilateral pleural effusions (left greater than right).  Mild diffuse interstitial prominence and peribronchial cuffing.  Mild engorgement of the pulmonary vasculature, without frank pulmonary edema.  Mild cardiomegaly. The patient is rotated to the right on today's exam, resulting in distortion of the mediastinal contours and reduced diagnostic sensitivity and specificity for mediastinal pathology.  Atherosclerosis in the  thoracic aorta.  Left hilar fullness.  IMPRESSION: 1.  Multifocal areas of apparent airspace consolidation in the lungs bilaterally, concerning for multilobar pneumonia.  The appearance of the left upper lobe in particular is somewhat unusual and slightly mass-like in appearance.  Additionally, there is left hilar fullness.  The possibility of centrally obstructing neoplasm around the left hilum, or a left upper lobe neoplasm with left hilar adenopathy is difficult to entirely exclude.  Clinical correlation is recommended.  At the very least, a follow-up PA and lateral chest radiograph in 2-3 weeks after appropriate trial of antimicrobial therapy is strongly recommended.  If for some reason there is strong clinical concern for neoplasm at this time, further evaluation with a contrast enhanced chest CT would provide additional diagnostic information. 2.  Mild cardiomegaly with pulmonary venous congestion, but no frank pulmonary edema. 3.  Atherosclerosis.   Original Report Authenticated By: Trudie Reed, M.D.   Results for orders  placed during the hospital encounter of 08/19/12  CBC WITH DIFFERENTIAL      Result Value Range   WBC 20.6 (*) 4.0 - 10.5 K/uL   RBC 4.13  3.87 - 5.11 MIL/uL   Hemoglobin 12.4  12.0 - 15.0 g/dL   HCT 40.9  81.1 - 91.4 %   MCV 92.3  78.0 - 100.0 fL   MCH 30.0  26.0 - 34.0 pg   MCHC 32.5  30.0 - 36.0 g/dL   RDW 78.2  95.6 - 21.3 %   Platelets 271  150 - 400 K/uL   Neutrophils Relative 79 (*) 43 - 77 %   Neutro Abs 16.4 (*) 1.7 - 7.7 K/uL   Lymphocytes Relative 11 (*) 12 - 46 %   Lymphs Abs 2.3  0.7 - 4.0 K/uL   Monocytes Relative 7  3 - 12 %   Monocytes Absolute 1.5 (*) 0.1 - 1.0 K/uL   Eosinophils Relative 2  0 - 5 %   Eosinophils Absolute 0.5  0.0 - 0.7 K/uL   Basophils Relative 0  0 - 1 %   Basophils Absolute 0.1  0.0 - 0.1 K/uL   RBC Morphology POLYCHROMASIA PRESENT    BASIC METABOLIC PANEL      Result Value Range   Sodium 137  135 - 145 mEq/L   Potassium 4.7  3.5 - 5.1 mEq/L   Chloride 101  96 - 112 mEq/L   CO2 24  19 - 32 mEq/L   Glucose, Bld 80  70 - 99 mg/dL   BUN 30 (*) 6 - 23 mg/dL   Creatinine, Ser 0.86 (*) 0.50 - 1.10 mg/dL   Calcium 9.6  8.4 - 57.8 mg/dL   GFR calc non Af Amer 30 (*) >90 mL/min   GFR calc Af Amer 35 (*) >90 mL/min  TROPONIN I      Result Value Range   Troponin I <0.30  <0.30 ng/mL  PRO B NATRIURETIC PEPTIDE      Result Value Range   Pro B Natriuretic peptide (BNP) 1833.0 (*) 0 - 125 pg/mL  D-DIMER, QUANTITATIVE      Result Value Range   D-Dimer, Quant 1.08 (*) 0.00 - 0.48 ug/mL-FEU   Patient with multilobar pneumonia concerning for mass especially in lul.  Patient treated for hap as recently hospitalized at [facility].  She has some history of pcn allergy and is given levaquin.  Plan admission for treatment and further work up.       I personally performed the services described  in this documentation, which was scribed in my presence. The recorded information has been reviewed and considered.    Hilario Quarry, MD 08/19/12  [phone]

## 2012-08-19 NOTE — ED Notes (Signed)
Report called to Lurena Joiner RN room [phone] bed is ready bed. [facility] here to transport pt to [facility] ,  secretary [phone] notified of pt departure.

## 2012-08-19 NOTE — H&P (Signed)
[facility] History and Physical  Valerie Stevenson WUJ:[id number] DOB: 10-Jan-1946    PCP:   Marga Melnick, MD   Chief Complaint: cough for a week.  HPI: Valerie Stevenson is an 67 y.o. female with hx of DM, CRI, toxic multinodular goiter, anemia, COPD, asthma, sleep apnea on CPAP,  HTN, GERD, hyperthyroidism, presents to [facility] with cough for about a week.  She has some blood tingued sputum.  There has been no shortness of breath or chest pain.  No fever, chills, or diaphoresis.  Evaluation in the ER included a CXR which showed multilobular infiltrate, but cannot exclude a pulmonary mass.  Radiologist recommended at least f/up CXR after Tx for CAP, or CT chest with contrast.   She does have CRI and her Cr is 1.7.  Her WBC is 20K.  She is stable, and hospitalist was asked to admit for multilobar PNA with abnormal CXR needing further work up.  Rewiew of Systems:  Constitutional: Negative for malaise, fever and chills. No significant weight loss or weight gain Eyes: Negative for eye pain, redness and discharge, diplopia, visual changes, or flashes of light. ENMT: Negative for ear pain, hoarseness, nasal congestion, sinus pressure and sore throat. No headaches; tinnitus, drooling, or problem swallowing. Cardiovascular: Negative for chest pain, palpitations, diaphoresis, dyspnea and peripheral edema. ; No orthopnea, PND Respiratory: Negative for wheezing and stridor. No pleuritic chestpain. Gastrointestinal: Negative for nausea, vomiting, diarrhea, constipation, abdominal pain, melena, blood in stool, hematemesis, jaundice and rectal bleeding.    Genitourinary: Negative for frequency, dysuria, incontinence,flank pain and hematuria; Musculoskeletal: Negative for back pain and neck pain. Negative for swelling and trauma.;  Skin: . Negative for pruritus, rash, abrasions, bruising and skin lesion.; ulcerations Neuro: Negative for headache, lightheadedness and neck stiffness. Negative for weakness,  altered level of consciousness , altered mental status, extremity weakness, burning feet, involuntary movement, seizure and syncope.  Psych: negative for depression, insomnia, tearfulness, panic attacks, hallucinations, paranoia, suicidal or homicidal ideation    Past Medical History  Diagnosis Date  . Diabetes mellitus      IDDM;Dr Everardo All  . Toxic multinodular goiter     Dr Everardo All  . OSA (obstructive sleep apnea)     CPAP;Dr Clint Young  . Glaucoma(365)   . Anemia     iron deficinecy  . Asthma   . COPD (chronic obstructive pulmonary disease)   . Depression   . GERD (gastroesophageal reflux disease)   . Hyperlipidemia   . Hypertension   . Hyperthyroidism     in context of nodule; Tapazole therapy since 2007  . Skin cancer     basal cell  . Diverticulosis of colon 2009  . Urticaria, idiopathic   . Renal disease     insufficiency   -- saw Nephrologist  in [place]..hasn't seen one in 2-3 yrs  . Arthritis   . Eczema   . DM (diabetes mellitus)   . Anxiety   . IBS (irritable bowel syndrome)     Past Surgical History  Procedure Laterality Date  . Total abdominal hysterectomy w/ bilateral salpingoophorectomy      sister with ovarian cancer  . Replacement total knee Left 2003    left ; Dr Sullivan Lone; [place]  . Total knee arthroplasty  10/19/2011    Procedure: TOTAL KNEE ARTHROPLASTY;  Surgeon: Loreta Ave, MD;  Location: [facility] OR;  Service: Orthopedics;  Laterality: Right;  DR MURPHY WANTS 90 MINUTES FOR THIS CASE   . Colonoscopy  2006  Next Due date 2016    Medications:  HOME MEDS: Prior to Admission medications   Medication Sig Start Date End Date Taking? Authorizing Provider  albuterol (PROVENTIL HFA;VENTOLIN HFA) 108 (90 BASE) MCG/ACT inhaler Inhale 2 puffs into the lungs every 4 (four) hours as needed for wheezing. 05/18/12   Pecola Lawless, MD  B-D UF III MINI PEN NEEDLES 31G X 5 MM MISC As directed 07/05/10   Historical Provider, MD  Cholecalciferol  4000 UNITS CAPS Take by mouth daily.    Historical Provider, MD  cloNIDine (CATAPRES) 0.1 MG tablet Take 1 tablet (0.1 mg total) by mouth 2 (two) times daily. 08/15/12   Pecola Lawless, MD  EPINEPHrine (EPIPEN 2-PAK) 0.3 mg/0.3 mL DEVI Inject 0.3 mLs (0.3 mg total) into the muscle once. 06/25/12   Waymon Budge, MD  fexofenadine (ALLEGRA) 180 MG tablet Take 180 mg by mouth daily.    Historical Provider, MD  furosemide (LASIX) 40 MG tablet Take 1 tablet (40 mg total) by mouth daily as needed. For swelling 07/30/12   Pecola Lawless, MD  hydrOXYzine (ATARAX/VISTARIL) 10 MG tablet Take 1 tablet (10 mg total) by mouth every 4 (four) hours as needed for itching. 06/14/12   Pecola Lawless, MD  insulin aspart (NOVOLOG FLEXPEN) 100 UNIT/ML injection Inject subcutaneously three times a day (just before each meal) 30-20-35 units 08/09/12   Romero Belling, MD  insulin glargine (LANTUS) 100 UNIT/ML injection Inject 0.5 mLs (50 Units total) into the skin at bedtime. 08/15/12   Romero Belling, MD  Insulin Pen Needle (RELION PEN NEEDLE 31G/8MM) 31G X 8 MM MISC USE AS DIRECTED THREE TIMES DAILY 08/09/12   Romero Belling, MD  Insulin Syringe-Needle U-100 (RELION INSULIN SYRINGE 1ML/31G) 31G X 5/16" 1 ML MISC USE AS DIRECTED ONCE DAILY. 08/09/12   Romero Belling, MD  LORazepam (ATIVAN) 0.5 MG tablet Take 1 tablet (0.5 mg total) by mouth at bedtime as needed. 04/11/12   Wanda Plump, MD  montelukast (SINGULAIR) 10 MG tablet Take 1 tablet (10 mg total) by mouth at bedtime. 06/20/12 06/20/13  Waymon Budge, MD  nebivolol (BYSTOLIC) 5 MG tablet Take 3 tablets (15 mg total) by mouth daily. 08/15/12   Pecola Lawless, MD  pravastatin (PRAVACHOL) 40 MG tablet TAKE ONE TABLET BY MOUTH EVERY DAY 08/15/12   Pecola Lawless, MD  Probiotic Product (PROBIOTIC PO) Take 1 tablet by mouth daily.    Historical Provider, MD  ranitidine (ZANTAC) 150 MG tablet TAKE ONE TABLET BY MOUTH TWICE DAILY 06/05/12   Pecola Lawless, MD  RELION ULTRA THIN PLUS  LANCETS MISC As directed  06/30/10   Historical Provider, MD     Allergies:  Allergies  Allergen Reactions  . Angiotensin Receptor Blockers Hives    Hives ??  . Latex     rash  . Penicillins Hives  . Shellfish-Derived Products Hives and Rash  . Zestril (Lisinopril) Hives  . Spironolactone     Intermittent renal insufficiency  . Coreg (Carvedilol) Hives  . Morphine And Related Itching    Social History:   reports that she has never smoked. She has never used smokeless tobacco. She reports that she does not drink alcohol or use illicit drugs.  Family History: Family History  Problem Relation Age of Onset  . Coronary artery disease Father     no MI  . Stroke Mother     in 20s  . Diabetes Mother   . Ovarian cancer Sister  twin sibling  . Alcohol abuse Maternal Grandfather   . Hypothyroidism Sister     twin  . Goiter Sister     resected  . COPD Father   . Breast cancer Sister   . Irritable bowel syndrome Sister      Physical Exam: Filed Vitals:   08/19/12 1858 08/19/12 1900 08/19/12 1930 08/19/12 2030  BP: 108/89 127/52 112/43 137/50  Pulse: 68 67 65 70  Temp:    97.8 F (36.6 C)  TempSrc:    Oral  Resp: 14 14 21 20   Height:    5' 5.5" (1.664 m)  Weight:    123.1 kg (271 lb 6.2 oz)  SpO2: 97% 96% 99% 98%   Blood pressure 137/50, pulse 70, temperature 97.8 F (36.6 C), temperature source Oral, resp. rate 20, height 5' 5.5" (1.664 m), weight 123.1 kg (271 lb 6.2 oz), SpO2 98.00%.  GEN:  Pleasant  patient lying in the stretcher in no acute distress; cooperative with exam. She is morbidly obese. PSYCH:  alert and oriented x4; does not appear anxious or depressed; affect is appropriate. HEENT: Mucous membranes pink and anicteric; PERRLA; EOM intact; no cervical lymphadenopathy nor thyromegaly or carotid bruit; no JVD; There were no stridor. Neck is very supple. Breasts:: Not examined CHEST WALL: No tenderness CHEST: Normal respiration, clear to auscultation  bilaterally.  HEART: Regular rate and rhythm.  There are no murmur, rub, or gallops.   BACK: No kyphosis or scoliosis; no CVA tenderness ABDOMEN: soft and non-tender; no masses, no organomegaly, normal abdominal bowel sounds; no pannus; no intertriginous candida. There is no rebound and no distention. Rectal Exam: Not done EXTREMITIES: No bone or joint deformity; age-appropriate arthropathy of the hands and knees; no edema; no ulcerations.  There is no calf tenderness. Genitalia: not examined PULSES: 2+ and symmetric SKIN: Normal hydration no rash or ulceration CNS: Cranial nerves 2-12 grossly intact no focal lateralizing neurologic deficit.  Speech is fluent; uvula elevated with phonation, facial symmetry and tongue midline. DTR are normal bilaterally, cerebella exam is intact, barbinski is negative and strengths are equaled bilaterally.  No sensory loss.   Labs on Admission:  Basic Metabolic Panel:  Recent Labs Lab 08/19/12 1705  NA 137  K 4.7  CL 101  CO2 24  GLUCOSE 80  BUN 30*  CREATININE 1.70*  CALCIUM 9.6   Liver Function Tests: No results found for this basename: AST, ALT, ALKPHOS, BILITOT, PROT, ALBUMIN,  in the last 168 hours No results found for this basename: LIPASE, AMYLASE,  in the last 168 hours No results found for this basename: AMMONIA,  in the last 168 hours CBC:  Recent Labs Lab 08/19/12 1705  WBC 20.6*  NEUTROABS 16.4*  HGB 12.4  HCT 38.1  MCV 92.3  PLT 271   Cardiac Enzymes:  Recent Labs Lab 08/19/12 1705  TROPONINI <0.30    CBG:  Recent Labs Lab 08/19/12 1911 08/19/12 2128  GLUCAP 88 145*     Radiological Exams on Admission: Dg Chest 2 View  08/19/2012  *RADIOLOGY REPORT*  Clinical Data: Cough, fever and shortness of breath for the past 2- 3 weeks.  CHEST - 2 VIEW  Comparison: Chest x-ray 10/13/2011.  Findings: There are multifocal areas of airspace disease in the lungs bilaterally, most confluent in the left upper lobe and in the  superior segment of the right lower lobe.  Possible trace bilateral pleural effusions (left greater than right).  Mild diffuse interstitial prominence and peribronchial cuffing.  Mild engorgement of the pulmonary vasculature, without frank pulmonary edema.  Mild cardiomegaly. The patient is rotated to the right on today's exam, resulting in distortion of the mediastinal contours and reduced diagnostic sensitivity and specificity for mediastinal pathology.  Atherosclerosis in the thoracic aorta.  Left hilar fullness.  IMPRESSION: 1.  Multifocal areas of apparent airspace consolidation in the lungs bilaterally, concerning for multilobar pneumonia.  The appearance of the left upper lobe in particular is somewhat unusual and slightly mass-like in appearance.  Additionally, there is left hilar fullness.  The possibility of centrally obstructing neoplasm around the left hilum, or a left upper lobe neoplasm with left hilar adenopathy is difficult to entirely exclude.  Clinical correlation is recommended.  At the very least, a follow-up PA and lateral chest radiograph in 2-3 weeks after appropriate trial of antimicrobial therapy is strongly recommended.  If for some reason there is strong clinical concern for neoplasm at this time, further evaluation with a contrast enhanced chest CT would provide additional diagnostic information. 2.  Mild cardiomegaly with pulmonary venous congestion, but no frank pulmonary edema. 3.  Atherosclerosis.   Original Report Authenticated By: Trudie Reed, M.D.     Assessment/Plan Present on Admission:  . Type 2 diabetes, uncontrolled, with renal manifestation . Renal insufficiency . Obstructive sleep apnea . HYPERTENSION . HYPERLIPIDEMIA . DEPRESSION . ASTHMA . Morbid obesity CAP +++ Abnormal CXR +++  PLAN:  Will admit her for CAP and tx with IV Rocephin and IV Zithromax.  I think I will obtain a chest CT with/OUT contrast as she has CRI just to see if she has a pulmonary  mass.  Her other problems are stable and I will continue her meds.  For her DM, will use SSI resistant scale.  For her CRI, will follow her Cr closely.  For her sleep apnea, she brought her own CPAP and will use her home settings.  She is stable, full code, and will be admitted to general medical floor under [facility] service.  She will be placed on droplet precaution.  Thank you for allowing me to partake in the care of your nice patient.  Other plans as per orders.  Code Status: FULL Unk Lightning, MD. [facility] Pager [phone] 7pm to 7am.  08/19/2012, 9:58 PM

## 2012-08-19 NOTE — ED Notes (Signed)
Increased o2 to 3L per [place]

## 2012-08-19 NOTE — Progress Notes (Signed)
Patient arrived [facility], patient alert and oriented, admissions notified of patients arrival.  BP 135.50, HR 70, T 97.8, R 20, O2 98% on 3L/min. Steele Berg RN

## 2012-08-19 NOTE — Progress Notes (Signed)
Pt is wearing home CPAP with nasal pillow. Pt CPAP is set at 10 cmH2O with no oxygen. Pt is comfortable and was made aware to call if she had any problems or questions.

## 2012-08-19 NOTE — ED Notes (Signed)
02 placed for room air sat 92%.  Placed on o2 at 2L per [place].

## 2012-08-19 NOTE — Progress Notes (Signed)
  PCP Sun[place]  66/F with Asthma presenting with multilobar pneumonia Vitals stable, CXR with additional abnormality, will need further imaging Accepted to Tele at [facility] [phone]

## 2012-08-19 NOTE — ED Notes (Signed)
Pt states she has been feeling bad for about 3 weeks and has been coughing for one. Also c/o chills, fever. Saw Dr. On Friday and given another inhaler.

## 2012-08-20 ENCOUNTER — Telehealth: Payer: Self-pay | Admitting: Internal Medicine

## 2012-08-20 ENCOUNTER — Inpatient Hospital Stay (HOSPITAL_COMMUNITY): Payer: Medicare Other

## 2012-08-20 DIAGNOSIS — E1129 Type 2 diabetes mellitus with other diabetic kidney complication: Secondary | ICD-10-CM

## 2012-08-20 DIAGNOSIS — J45909 Unspecified asthma, uncomplicated: Secondary | ICD-10-CM

## 2012-08-20 DIAGNOSIS — N058 Unspecified nephritic syndrome with other morphologic changes: Secondary | ICD-10-CM

## 2012-08-20 DIAGNOSIS — J189 Pneumonia, unspecified organism: Secondary | ICD-10-CM | POA: Diagnosis present

## 2012-08-20 LAB — STREP PNEUMONIAE URINARY ANTIGEN: Strep Pneumo Urinary Antigen: NEGATIVE

## 2012-08-20 LAB — GLUCOSE, CAPILLARY: Glucose-Capillary: 155 mg/dL — ABNORMAL HIGH (ref 70–99)

## 2012-08-20 MED ORDER — ALBUTEROL SULFATE HFA 108 (90 BASE) MCG/ACT IN AERS
2.0000 | INHALATION_SPRAY | RESPIRATORY_TRACT | Status: DC | PRN
  Administered 2012-08-20: 2 via RESPIRATORY_TRACT
  Filled 2012-08-20: qty 6.7

## 2012-08-20 NOTE — Telephone Encounter (Signed)
This phone message from her niece was reviewed. Mrs. Diebold is mentally competent; I do not know if she has designated her niece as power of attorney. If this is the case; I am willing to discuss the record with her.  I also reviewed her office visit 08/16/12.She was here to discuss HTN management.  She was afebrile and had normal vital signs except for some bradycardia. Her history did not suggest pneumonia. As a pulmonologist, I do not understand how the physician could "date" pneumonia. The CT scan was done without contrast because of her renal impairment;I personally reviewed the images. I'm still concerned about pulmonary emboli. Opportunistic infection is possible that she has been on intermittent steroid bursts. A problem problem todate has been fractionated care as the [facility] facility records did not enter the Epic system. I'll forward this message to the Hospitalist once he/she is identified as the attending physician.

## 2012-08-20 NOTE — Progress Notes (Signed)
Utilization Review Completed.   Alvino Lechuga, RN, BSN Nurse Case Manager  [phone]  

## 2012-08-20 NOTE — Progress Notes (Signed)
Nutrition Brief Note  Malnutrition Screening Tool result is inaccurate.  Please consult if nutrition needs are identified.  Djon Tith MS, RD, LDN Pager: [phone] After-hours pager: [phone]    

## 2012-08-20 NOTE — Telephone Encounter (Signed)
Noted, will send to MD (FYI)

## 2012-08-20 NOTE — Telephone Encounter (Signed)
Patients niece called in stating that patient was seen last week and she wanted to know that patient is now at [facility] hospital with double pneumonia. She states that the physician there told her that she has had pneumonia for awhile.

## 2012-08-20 NOTE — Progress Notes (Addendum)
[facility] PROGRESS NOTE  Valerie Stevenson:[id number] DOB: 1945/08/20 DOA: 08/19/2012 PCP: Marga Melnick, MD  Assessment/Plan: 1. Multifocal pneumonia -community acquired -continue IV rocephin/zithromax -check influenza PCR,  -urine legionella and pneumococcal Ag -nebs for pulm toilet - Fu CT chest or CXR to ensure resolution  2. Asthma: -stable, not wheezing at this time -albuterol PRN, singulair  3. DM: - continue Lantus, SSI  4. HTN: -continue amlodipine, bystolic  DVT proph: heparin SQ  Code Status: FULL Family Communication: none at bedside Disposition Plan: home when improved     Antibiotics:  Rocephin 4/27  zithromax 4/27  HPI/Subjective: Feels ill and weak and Sob with activity  Objective: Filed Vitals:   08/19/12 2200 08/20/12 0421 08/20/12 0900 08/20/12 1400  BP:  154/53 151/97 162/82  Pulse: 77 80 87 84  Temp:  98.3 F (36.8 C) 97 F (36.1 C) 98.4 F (36.9 C)  TempSrc:  Oral Oral Oral  Resp: 18 18 18 18   Height:      Weight:      SpO2: 95% 94% 95% 97%    Intake/Output Summary (Last 24 hours) at 08/20/12 1650 Last data filed at 08/20/12 1500  Gross per 24 hour  Intake 2064.58 ml  Output      0 ml  Net 2064.58 ml   Filed Weights   08/19/12 1631 08/19/12 2030  Weight: 122.471 kg (270 lb) 123.1 kg (271 lb 6.2 oz)    Exam: GEN: Pleasant, obese female, lying in bed in no acute distress;  HEENT: Mucous membranes pink and anicteric; PERRLA; EOM intact; no cervical lymphadenopathy nor thyromegaly or carotid bruit; no JVD; There were no stridor. Neck is very supple.  CHEST: Normal respiration, scattered ronchi HEART: Regular rate and rhythm. There are no murmur, rub, or gallops.  ABDOMEN: soft and non-tender; no masses, no organomegaly, normal abdominal bowel sounds; no pannus; no intertriginous candida. There is no rebound and no distention.  EXTREMITIES: No bone or joint deformity; age-appropriate arthropathy of the hands and  knees; no edema; no ulcerations. There is no calf tenderness.  PULSES: 2+ and symmetric  SKIN: Normal hydration no rash or ulceration     Data Reviewed: Basic Metabolic Panel:  Recent Labs Lab 08/19/12 1705 08/19/12 2153  NA 137  --   K 4.7  --   CL 101  --   CO2 24  --   GLUCOSE 80  --   BUN 30*  --   CREATININE 1.70* 1.62*  CALCIUM 9.6  --    Liver Function Tests: No results found for this basename: AST, ALT, ALKPHOS, BILITOT, PROT, ALBUMIN,  in the last 168 hours No results found for this basename: LIPASE, AMYLASE,  in the last 168 hours No results found for this basename: AMMONIA,  in the last 168 hours CBC:  Recent Labs Lab 08/19/12 1705 08/19/12 2153  WBC 20.6* 20.8*  NEUTROABS 16.4*  --   HGB 12.4 12.2  HCT 38.1 36.8  MCV 92.3 88.5  PLT 271 273   Cardiac Enzymes:  Recent Labs Lab 08/19/12 1705  TROPONINI <0.30   BNP (last 3 results)  Recent Labs  07/18/12 1005 08/19/12 1705  PROBNP 198.0* 1833.0*   CBG:  Recent Labs Lab 08/19/12 1911 08/19/12 2128  GLUCAP 88 145*    No results found for this or any previous visit (from the past 240 hour(s)).   Studies: Dg Chest 2 View  08/19/2012  *RADIOLOGY REPORT*  Clinical Data: Cough, fever and shortness of  breath for the past 2- 3 weeks.  CHEST - 2 VIEW  Comparison: Chest x-ray 10/13/2011.  Findings: There are multifocal areas of airspace disease in the lungs bilaterally, most confluent in the left upper lobe and in the superior segment of the right lower lobe.  Possible trace bilateral pleural effusions (left greater than right).  Mild diffuse interstitial prominence and peribronchial cuffing.  Mild engorgement of the pulmonary vasculature, without frank pulmonary edema.  Mild cardiomegaly. The patient is rotated to the right on today's exam, resulting in distortion of the mediastinal contours and reduced diagnostic sensitivity and specificity for mediastinal pathology.  Atherosclerosis in the thoracic  aorta.  Left hilar fullness.  IMPRESSION: 1.  Multifocal areas of apparent airspace consolidation in the lungs bilaterally, concerning for multilobar pneumonia.  The appearance of the left upper lobe in particular is somewhat unusual and slightly mass-like in appearance.  Additionally, there is left hilar fullness.  The possibility of centrally obstructing neoplasm around the left hilum, or a left upper lobe neoplasm with left hilar adenopathy is difficult to entirely exclude.  Clinical correlation is recommended.  At the very least, a follow-up PA and lateral chest radiograph in 2-3 weeks after appropriate trial of antimicrobial therapy is strongly recommended.  If for some reason there is strong clinical concern for neoplasm at this time, further evaluation with a contrast enhanced chest CT would provide additional diagnostic information. 2.  Mild cardiomegaly with pulmonary venous congestion, but no frank pulmonary edema. 3.  Atherosclerosis.   Original Report Authenticated By: Trudie Reed, M.D.    Ct Chest Wo Contrast  08/20/2012  *RADIOLOGY REPORT*  Clinical Data: Cough for 1 week.  CT CHEST WITHOUT CONTRAST  Technique:  Multidetector CT imaging of the chest was performed following the standard protocol without IV contrast.  Comparison: 08/19/2012 chest x-ray  Findings: Thyroid gland is diffusely heterogeneous with asymmetric enlargement of the right lobe.  This is been evaluated previously with multiple nuclear medicine studies performed and 2012.  No axillary lymphadenopathy.  There is mediastinal lymphadenopathy with a 11 mm short-axis right paratracheal lymph node and a 10 mm short-axis AP window lymph node.  10 mm short-axis subcarinal lymph node is evident.  The heart is mildly enlarged.  No pericardial effusion.  Tiny left pleural effusion noted.  Lung windows demonstrate focal airspace consolidation in the left upper lobe, following a segmental distribution.  There is focal airspace consolidation  in the posterior right lower lobe with patchy areas of central airspace disease in the left lower lobe. Apparent small pleural plaque in the anterior right hemithorax contains calcification.  Bone windows reveal no worrisome lytic or sclerotic osseous lesions.  IMPRESSION: Multifocal airspace consolidation to suggest pneumonia although neoplasm could have this appearance.  Small left pleural effusion.   Original Report Authenticated By: Kennith Center, M.D.     Scheduled Meds: . amLODipine  5 mg Oral Daily  . antiseptic oral rinse  15 mL Mouth Rinse BID  . aspirin EC  81 mg Oral Daily  . azithromycin  500 mg Intravenous Q24H  . cefTRIAXone (ROCEPHIN)  IV  1 g Intravenous Q24H  . cholecalciferol  1,000 Units Oral Daily  . cloNIDine  0.1 mg Oral BID  . docusate sodium  100 mg Oral BID  . famotidine  20 mg Oral BID  . heparin  5,000 Units Subcutaneous Q8H  . insulin aspart  0-20 Units Subcutaneous TID WC  . insulin aspart  0-5 Units Subcutaneous QHS  . insulin  aspart  6 Units Subcutaneous TID WC  . insulin glargine  50 Units Subcutaneous QHS  . loratadine  10 mg Oral Daily  . montelukast  10 mg Oral QHS  . nebivolol  15 mg Oral Daily  . saccharomyces boulardii  250 mg Oral Daily  . simvastatin  20 mg Oral q1800   Continuous Infusions: . sodium chloride 75 mL/hr at 08/20/12 1007    Active Problems:   HYPERLIPIDEMIA   DEPRESSION   HYPERTENSION   ASTHMA   Obstructive sleep apnea   Type 2 diabetes, uncontrolled, with renal manifestation   Morbid obesity   Renal insufficiency   HAP (hospital-acquired pneumonia)    Time spent:    [facility]  [facility] Pager [phone]. If 7PM-7AM, please contact night-coverage at [email or web address], password [facility] 08/20/2012, 4:50 PM  LOS: 1 day

## 2012-08-21 DIAGNOSIS — G4733 Obstructive sleep apnea (adult) (pediatric): Secondary | ICD-10-CM

## 2012-08-21 LAB — CBC
MCV: 88.8 fL (ref 78.0–100.0)
Platelets: 275 10*3/uL (ref 150–400)
RDW: 13.8 % (ref 11.5–15.5)
WBC: 15 10*3/uL — ABNORMAL HIGH (ref 4.0–10.5)

## 2012-08-21 LAB — BASIC METABOLIC PANEL
Chloride: 102 mEq/L (ref 96–112)
Creatinine, Ser: 1.45 mg/dL — ABNORMAL HIGH (ref 0.50–1.10)
GFR calc Af Amer: 42 mL/min — ABNORMAL LOW (ref >90)
GFR calc non Af Amer: 36 mL/min — ABNORMAL LOW (ref >90)
Potassium: 4.8 mEq/L (ref 3.5–5.1)

## 2012-08-21 LAB — GLUCOSE, CAPILLARY
Glucose-Capillary: 167 mg/dL — ABNORMAL HIGH (ref 70–99)
Glucose-Capillary: 245 mg/dL — ABNORMAL HIGH (ref 70–99)
Glucose-Capillary: 194 mg/dL — ABNORMAL HIGH (ref 70–99)
Glucose-Capillary: 231 mg/dL — ABNORMAL HIGH (ref 70–99)
Glucose-Capillary: 160 mg/dL — ABNORMAL HIGH (ref 70–99)

## 2012-08-21 MED ORDER — INSULIN GLARGINE 100 UNIT/ML ~~LOC~~ SOLN
55.0000 [IU] | Freq: Every day | SUBCUTANEOUS | Status: DC
  Administered 2012-08-21 – 2012-08-25 (×5): 55 [IU] via SUBCUTANEOUS
  Filled 2012-08-21 (×6): qty 0.55

## 2012-08-21 NOTE — Progress Notes (Signed)
[facility] PROGRESS NOTE  Valerie Stevenson HYQ:[id number] DOB: Sep 18, 1945 DOA: 08/19/2012 PCP: Marga Melnick, MD  Assessment/Plan: 1. Multifocal pneumonia -clinically improving, afebrile and WBC trending down -continue IV rocephin/zithromax Day 2 -influenza PCR negative -urine legionella pending, pneumococcal Ag negative -nebs for pulm toilet - Fu CT chest or CXR to ensure resolution -OOB to chair and Ambulate -weaned off O2  2. Asthma: -stable, not wheezing at this time -albuterol PRN, singulair  3. DM: stable - continue Lantus, SSI  4. HTN: -continue amlodipine, bystolic  DVT proph: heparin SQ  Code Status: FULL Family Communication: none at bedside Disposition Plan: home when improved     Antibiotics:  Rocephin 4/27  zithromax 4/27  HPI/Subjective: Cough improved, breathing a little better, still SoB with activity, no chest pain  Objective: Filed Vitals:   08/20/12 1400 08/20/12 2051 08/21/12 0459 08/21/12 0900  BP: 162/82 149/99 140/42 148/48  Pulse: 84 82 72 70  Temp: 98.4 F (36.9 C) 99.5 F (37.5 C) 99.9 F (37.7 C) 98.2 F (36.8 C)  TempSrc: Oral Oral Oral Oral  Resp: 18 18 18 18   Height:      Weight:  124.875 kg (275 lb 4.8 oz)    SpO2: 97% 92% 93% 96%    Intake/Output Summary (Last 24 hours) at 08/21/12 1307 Last data filed at 08/21/12 0900  Gross per 24 hour  Intake 1206.25 ml  Output      0 ml  Net 1206.25 ml   Filed Weights   08/19/12 1631 08/19/12 2030 08/20/12 2051  Weight: 122.471 kg (270 lb) 123.1 kg (271 lb 6.2 oz) 124.875 kg (275 lb 4.8 oz)    Exam: GEN: Pleasant, obese female, lying in bed in no acute distress;  HEENT: Mucous membranes pink and anicteric; PERRLA; EOM intact; no cervical lymphadenopathy nor thyromegaly or carotid bruit; no JVD; There were no stridor. Neck is very supple.  CHEST: Normal respiration, scattered ronchi HEART: Regular rate and rhythm. There are no murmur, rub, or gallops.  ABDOMEN: soft  and non-tender; no masses, no organomegaly, normal abdominal bowel sounds; no pannus; no intertriginous candida. There is no rebound and no distention.  EXTREMITIES: No bone or joint deformity; age-appropriate arthropathy of the hands and knees; no edema; no ulcerations. There is no calf tenderness.  PULSES: 2+ and symmetric  SKIN: Normal hydration no rash or ulceration     Data Reviewed: Basic Metabolic Panel:  Recent Labs Lab 08/19/12 1705 08/19/12 2153 08/21/12 0704  NA 137  --  134*  K 4.7  --  4.8  CL 101  --  102  CO2 24  --  20  GLUCOSE 80  --  229*  BUN 30*  --  25*  CREATININE 1.70* 1.62* 1.45*  CALCIUM 9.6  --  9.5   Liver Function Tests: No results found for this basename: AST, ALT, ALKPHOS, BILITOT, PROT, ALBUMIN,  in the last 168 hours No results found for this basename: LIPASE, AMYLASE,  in the last 168 hours No results found for this basename: AMMONIA,  in the last 168 hours CBC:  Recent Labs Lab 08/19/12 1705 08/19/12 2153 08/21/12 0704  WBC 20.6* 20.8* 15.0*  NEUTROABS 16.4*  --   --   HGB 12.4 12.2 11.1*  HCT 38.1 36.8 34.2*  MCV 92.3 88.5 88.8  PLT 271 273 275   Cardiac Enzymes:  Recent Labs Lab 08/19/12 1705  TROPONINI <0.30   BNP (last 3 results)  Recent Labs  07/18/12 1005 08/19/12  1705  PROBNP 198.0* 1833.0*   CBG:  Recent Labs Lab 08/20/12 1138 08/20/12 1702 08/20/12 2054 08/21/12 0744 08/21/12 1147  GLUCAP 167* 155* 245* 193* 194*    Recent Results (from the past 240 hour(s))  CULTURE, BLOOD (ROUTINE X 2)     Status: None   Collection Time    08/19/12  6:11 PM      Result Value Range Status   Specimen Description BLOOD RIGHT HAND   Final   Special Requests BOTTLES DRAWN AEROBIC AND ANAEROBIC 5CC EACH   Final   Culture  Setup Time 08/20/2012 01:33   Final   Culture     Final   Value:        BLOOD CULTURE RECEIVED NO GROWTH TO DATE CULTURE WILL BE HELD FOR 5 DAYS BEFORE ISSUING A FINAL NEGATIVE REPORT   Report  Status PENDING   Incomplete  CULTURE, BLOOD (ROUTINE X 2)     Status: None   Collection Time    08/19/12  7:16 PM      Result Value Range Status   Specimen Description BLOOD LEFT ARM   Final   Special Requests BOTTLES DRAWN AEROBIC AND ANAEROBIC 5CC EACH   Final   Culture  Setup Time 08/20/2012 01:33   Final   Culture     Final   Value:        BLOOD CULTURE RECEIVED NO GROWTH TO DATE CULTURE WILL BE HELD FOR 5 DAYS BEFORE ISSUING A FINAL NEGATIVE REPORT   Report Status PENDING   Incomplete     Studies: Dg Chest 2 View  08/19/2012  *RADIOLOGY REPORT*  Clinical Data: Cough, fever and shortness of breath for the past 2- 3 weeks.  CHEST - 2 VIEW  Comparison: Chest x-ray 10/13/2011.  Findings: There are multifocal areas of airspace disease in the lungs bilaterally, most confluent in the left upper lobe and in the superior segment of the right lower lobe.  Possible trace bilateral pleural effusions (left greater than right).  Mild diffuse interstitial prominence and peribronchial cuffing.  Mild engorgement of the pulmonary vasculature, without frank pulmonary edema.  Mild cardiomegaly. The patient is rotated to the right on today's exam, resulting in distortion of the mediastinal contours and reduced diagnostic sensitivity and specificity for mediastinal pathology.  Atherosclerosis in the thoracic aorta.  Left hilar fullness.  IMPRESSION: 1.  Multifocal areas of apparent airspace consolidation in the lungs bilaterally, concerning for multilobar pneumonia.  The appearance of the left upper lobe in particular is somewhat unusual and slightly mass-like in appearance.  Additionally, there is left hilar fullness.  The possibility of centrally obstructing neoplasm around the left hilum, or a left upper lobe neoplasm with left hilar adenopathy is difficult to entirely exclude.  Clinical correlation is recommended.  At the very least, a follow-up PA and lateral chest radiograph in 2-3 weeks after appropriate trial  of antimicrobial therapy is strongly recommended.  If for some reason there is strong clinical concern for neoplasm at this time, further evaluation with a contrast enhanced chest CT would provide additional diagnostic information. 2.  Mild cardiomegaly with pulmonary venous congestion, but no frank pulmonary edema. 3.  Atherosclerosis.   Original Report Authenticated By: Trudie Reed, M.D.    Ct Chest Wo Contrast  08/20/2012  *RADIOLOGY REPORT*  Clinical Data: Cough for 1 week.  CT CHEST WITHOUT CONTRAST  Technique:  Multidetector CT imaging of the chest was performed following the standard protocol without IV contrast.  Comparison: 08/19/2012  chest x-ray  Findings: Thyroid gland is diffusely heterogeneous with asymmetric enlargement of the right lobe.  This is been evaluated previously with multiple nuclear medicine studies performed and 2012.  No axillary lymphadenopathy.  There is mediastinal lymphadenopathy with a 11 mm short-axis right paratracheal lymph node and a 10 mm short-axis AP window lymph node.  10 mm short-axis subcarinal lymph node is evident.  The heart is mildly enlarged.  No pericardial effusion.  Tiny left pleural effusion noted.  Lung windows demonstrate focal airspace consolidation in the left upper lobe, following a segmental distribution.  There is focal airspace consolidation in the posterior right lower lobe with patchy areas of central airspace disease in the left lower lobe. Apparent small pleural plaque in the anterior right hemithorax contains calcification.  Bone windows reveal no worrisome lytic or sclerotic osseous lesions.  IMPRESSION: Multifocal airspace consolidation to suggest pneumonia although neoplasm could have this appearance.  Small left pleural effusion.   Original Report Authenticated By: Kennith Center, M.D.     Scheduled Meds: . amLODipine  5 mg Oral Daily  . antiseptic oral rinse  15 mL Mouth Rinse BID  . aspirin EC  81 mg Oral Daily  . azithromycin  500 mg  Intravenous Q24H  . cefTRIAXone (ROCEPHIN)  IV  1 g Intravenous Q24H  . cholecalciferol  1,000 Units Oral Daily  . cloNIDine  0.1 mg Oral BID  . docusate sodium  100 mg Oral BID  . famotidine  20 mg Oral BID  . heparin  5,000 Units Subcutaneous Q8H  . insulin aspart  0-20 Units Subcutaneous TID WC  . insulin aspart  0-5 Units Subcutaneous QHS  . insulin aspart  6 Units Subcutaneous TID WC  . insulin glargine  55 Units Subcutaneous QHS  . loratadine  10 mg Oral Daily  . montelukast  10 mg Oral QHS  . nebivolol  15 mg Oral Daily  . saccharomyces boulardii  250 mg Oral Daily  . simvastatin  20 mg Oral q1800   Continuous Infusions: . sodium chloride 75 mL/hr at 08/20/12 1007    Active Problems:   HYPERLIPIDEMIA   DEPRESSION   HYPERTENSION   ASTHMA   Obstructive sleep apnea   Type 2 diabetes, uncontrolled, with renal manifestation   Morbid obesity   Renal insufficiency   CAP (community acquired pneumonia)    Time spent:    [facility]  [facility] Pager [phone]. If 7PM-7AM, please contact night-coverage at [email or web address], password [facility] 08/21/2012, 1:07 PM  LOS: 2 days

## 2012-08-21 NOTE — Progress Notes (Signed)
Pt was is wearing home CPAP. Set to 10cm H20 with no O2 bled in. Pt aware to call if she needs anything.

## 2012-08-22 ENCOUNTER — Inpatient Hospital Stay (HOSPITAL_COMMUNITY): Payer: Medicare Other

## 2012-08-22 ENCOUNTER — Other Ambulatory Visit: Payer: Self-pay | Admitting: Pulmonary Disease

## 2012-08-22 DIAGNOSIS — R918 Other nonspecific abnormal finding of lung field: Secondary | ICD-10-CM

## 2012-08-22 DIAGNOSIS — L501 Idiopathic urticaria: Secondary | ICD-10-CM

## 2012-08-22 LAB — LEGIONELLA ANTIGEN, URINE: Legionella Antigen, Urine: NEGATIVE

## 2012-08-22 LAB — BASIC METABOLIC PANEL
BUN: 25 mg/dL — ABNORMAL HIGH (ref 6–23)
Chloride: 103 mEq/L (ref 96–112)
GFR calc Af Amer: 43 mL/min — ABNORMAL LOW (ref >90)
GFR calc non Af Amer: 37 mL/min — ABNORMAL LOW (ref >90)
Potassium: 4.7 mEq/L (ref 3.5–5.1)
Sodium: 137 mEq/L (ref 135–145)

## 2012-08-22 LAB — GLUCOSE, CAPILLARY
Glucose-Capillary: 171 mg/dL — ABNORMAL HIGH (ref 70–99)
Glucose-Capillary: 185 mg/dL — ABNORMAL HIGH (ref 70–99)

## 2012-08-22 LAB — CBC
HCT: 34.2 % — ABNORMAL LOW (ref 36.0–46.0)
Platelets: 305 10*3/uL (ref 150–400)
RDW: 13.6 % (ref 11.5–15.5)
WBC: 14.6 10*3/uL — ABNORMAL HIGH (ref 4.0–10.5)

## 2012-08-22 MED ORDER — ACETAMINOPHEN 325 MG PO TABS
650.0000 mg | ORAL_TABLET | ORAL | Status: DC | PRN
  Administered 2012-08-22 – 2012-08-23 (×2): 650 mg via ORAL
  Filled 2012-08-22 (×2): qty 2

## 2012-08-22 MED ORDER — DEXTROSE 5 % IV SOLN
1.0000 g | INTRAVENOUS | Status: DC
  Filled 2012-08-22: qty 10

## 2012-08-22 MED ORDER — DEXTROSE 5 % IV SOLN
500.0000 mg | INTRAVENOUS | Status: DC
  Administered 2012-08-22 – 2012-08-25 (×4): 500 mg via INTRAVENOUS
  Filled 2012-08-22 (×5): qty 500

## 2012-08-22 MED ORDER — LEVOFLOXACIN 500 MG PO TABS: 500.0000 mg | ORAL_TABLET | Freq: Every day | ORAL | Status: AC

## 2012-08-22 MED ORDER — AZITHROMYCIN 500 MG PO TABS: 500.0000 mg | ORAL_TABLET | Freq: Every day | ORAL | Status: AC

## 2012-08-22 MED ORDER — GUAIFENESIN ER 600 MG PO TB12: 600.0000 mg | ORAL_TABLET | Freq: Two times a day (BID) | ORAL | Status: DC

## 2012-08-22 MED ORDER — DEXTROSE 5 % IV SOLN
1.0000 g | INTRAVENOUS | Status: DC
  Administered 2012-08-22 – 2012-08-25 (×4): 1 g via INTRAVENOUS
  Filled 2012-08-22 (×5): qty 10

## 2012-08-22 MED ORDER — SACCHAROMYCES BOULARDII 250 MG PO CAPS: 250.0000 mg | ORAL_CAPSULE | Freq: Every day | ORAL | Status: AC

## 2012-08-22 NOTE — Consult Note (Signed)
PULMONARY  / CRITICAL CARE MEDICINE  Name: Valerie Stevenson MRN: [id number] DOB: 12/21/1945    ADMISSION DATE:  08/19/2012 CONSULTATION DATE:  08/22/12  REFERRING MD :  Dr. Susie Cassette PRIMARY SERVICE:  TRH  CHIEF COMPLAINT:  PNA   BRIEF PATIENT DESCRIPTION: 67 y/o WF admitted on 4/27 with cough, chills / sweats for 2-3 wks.  Initial CXR with LUL, RLL infiltrate.  Further evaluated with CT w/o contrast demonstrated significant infiltrate LUL, RLL not excluding mass.  PCCM consulted for workup.   SIGNIFICANT EVENTS / STUDIES:  4/28 CT Chest w/o>>>Multifocal airspace consolidation to suggest pneumonia although neoplasm could have this appearance. Small left pleural effusion.   LINES / TUBES:   CULTURES: 4/28 U. Strep>>>neg 4/27 BCx2>>>  ANTIBIOTICS: Rocephin 4/27>>>4/30 Azithro 4/27>>4/30 Vanco 4/28>>>  Zosyn 4/28>>>  HISTORY OF PRESENT ILLNESS:  67 y/o WF, retired [employer] fundraiser 2011, never smoker, with PMH of OSA on CPAP (followed by Dr. Maple Hudson), ? Asthma who was recently seen in ER at [facility] 3/12-3/13/14 for acute asthma exacerbation & ongoing urticaria flare treated with prednisone.  Patient reports she has been feeling tired / low energy since before Christmas 2013.  Most recently, in the last 2-3 weeks she has been significantly fatigued, had a dry cough with two episodes (isolated) of sputum production - once green / yellow and the other clear with blood streaks. She denies shortness of breath, chest pain, chest pain with deep inspiration, documented fevers, hemoptysis, orthopnea, weight loss.  She notes no increase in GERD symptoms and has been faithful with her reflux regimen.  Endorses night sweats, chills, weight gain (approx 10lbs), dry cough and L>R lower ext swelling.  Denies travel in last year, has never lived in [place], denies birds, hot tubs, sick exposures.  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   PAST  MEDICAL HISTORY :  Past Medical History  Diagnosis Date  . Diabetes mellitus      IDDM;Dr Everardo All  . Toxic multinodular goiter     Dr Everardo All  . OSA (obstructive sleep apnea)     CPAP;Dr Clint Young  . Glaucoma(365)   . Anemia     iron deficinecy  . Asthma   . COPD (chronic obstructive pulmonary disease)   . Depression   . GERD (gastroesophageal reflux disease)   . Hyperlipidemia   . Hypertension   . Hyperthyroidism     in context of nodule; Tapazole therapy since 2007  . Skin cancer     basal cell  . Diverticulosis of colon 2009  . Urticaria, idiopathic   . Renal disease     insufficiency   -- saw Nephrologist  in [place]..hasn't seen one in 2-3 yrs  . Arthritis   . Eczema   . DM (diabetes mellitus)   . Anxiety   . IBS (irritable bowel syndrome)    Past Surgical History  Procedure Laterality Date  . Total abdominal hysterectomy w/ bilateral salpingoophorectomy      sister with ovarian cancer  . Replacement total knee Left 2003    left ; Dr Sullivan Lone; [place]  . Total knee arthroplasty  10/19/2011    Procedure: TOTAL KNEE ARTHROPLASTY;  Surgeon: Loreta Ave, MD;  Location: [facility] OR;  Service: Orthopedics;  Laterality: Right;  DR MURPHY WANTS 90 MINUTES FOR THIS CASE   . Colonoscopy  2006    Next Due date 2016   Prior to Admission medications  Medication Sig Start Date End Date Taking? Authorizing Provider  albuterol (PROVENTIL HFA;VENTOLIN HFA) 108 (90 BASE) MCG/ACT inhaler Inhale 2 puffs into the lungs every 4 (four) hours as needed for wheezing. 05/18/12  Yes Pecola Lawless, MD  amLODipine (NORVASC) 5 MG tablet Take 5 mg by mouth 2 (two) times daily.   Yes Historical Provider, MD  aspirin EC 81 MG tablet Take 81 mg by mouth daily with supper.   Yes Historical Provider, MD  budesonide-formoterol (SYMBICORT) 80-4.5 MCG/ACT inhaler Inhale 2 puffs into the lungs 2 (two) times daily.   Yes Historical Provider, MD  cetirizine (ZYRTEC) 10 MG tablet Take 10 mg by  mouth daily with supper.   Yes Historical Provider, MD  Cholecalciferol (VITAMIN D3) 2000 UNITS capsule Take 4,000 Units by mouth daily.   Yes Historical Provider, MD  cloNIDine (CATAPRES) 0.1 MG tablet Take 1 tablet (0.1 mg total) by mouth 2 (two) times daily. 08/15/12  Yes Pecola Lawless, MD  EPINEPHrine (EPIPEN 2-PAK) 0.3 mg/0.3 mL DEVI Inject 0.3 mLs (0.3 mg total) into the muscle once. 06/25/12  Yes Waymon Budge, MD  fexofenadine (ALLEGRA) 180 MG tablet Take 180 mg by mouth daily.   Yes Historical Provider, MD  furosemide (LASIX) 40 MG tablet Take 1 tablet (40 mg total) by mouth daily as needed. For swelling 07/30/12  Yes Pecola Lawless, MD  hydrOXYzine (ATARAX/VISTARIL) 10 MG tablet Take 1 tablet (10 mg total) by mouth every 4 (four) hours as needed for itching. 06/14/12  Yes Pecola Lawless, MD  insulin aspart (NOVOLOG FLEXPEN) 100 UNIT/ML injection Inject 20-35 Units into the skin See admin instructions. 30 units in the am, 20 units midday and 35 units in the pm   Yes Historical Provider, MD  insulin glargine (LANTUS) 100 UNIT/ML injection Inject 0.5 mLs (50 Units total) into the skin at bedtime. 08/15/12  Yes Romero Belling, MD  LORazepam (ATIVAN) 0.5 MG tablet Take 0.5 mg by mouth at bedtime as needed for anxiety. For sleep   Yes Historical Provider, MD  montelukast (SINGULAIR) 10 MG tablet Take 1 tablet (10 mg total) by mouth at bedtime. 06/20/12 06/20/13 Yes Clinton D Young, MD  nebivolol (BYSTOLIC) 5 MG tablet Take 3 tablets (15 mg total) by mouth daily. 08/15/12  Yes Pecola Lawless, MD  pravastatin (PRAVACHOL) 40 MG tablet Take 40 mg by mouth at bedtime.   Yes Historical Provider, MD  Probiotic Product (PROBIOTIC PO) Take 1 tablet by mouth daily.   Yes Historical Provider, MD  ranitidine (ZANTAC) 150 MG tablet Take 150 mg by mouth 2 (two) times daily.   Yes Historical Provider, MD  azithromycin (ZITHROMAX) 500 MG tablet Take 1 tablet (500 mg total) by mouth daily. 08/22/12 08/27/12  Richarda Overlie, MD  B-D UF III MINI PEN NEEDLES 31G X 5 MM MISC As directed 07/05/10   Historical Provider, MD  guaiFENesin (MUCINEX) 600 MG 12 hr tablet Take 1 tablet (600 mg total) by mouth 2 (two) times daily. 08/22/12   Richarda Overlie, MD  Insulin Pen Needle (RELION PEN NEEDLE 31G/8MM) 31G X 8 MM MISC USE AS DIRECTED THREE TIMES DAILY 08/09/12   Romero Belling, MD  Insulin Syringe-Needle U-100 (RELION INSULIN SYRINGE 1ML/31G) 31G X 5/16" 1 ML MISC USE AS DIRECTED ONCE DAILY. 08/09/12   Romero Belling, MD  levofloxacin (LEVAQUIN) 500 MG tablet Take 1 tablet (500 mg total) by mouth daily. 08/22/12 08/28/12  Richarda Overlie, MD  RELION ULTRA THIN PLUS LANCETS MISC  As directed  06/30/10   Historical Provider, MD  saccharomyces boulardii (FLORASTOR) 250 MG capsule Take 1 capsule (250 mg total) by mouth daily. 08/22/12 09/05/12  Richarda Overlie, MD   Allergies  Allergen Reactions  . Angiotensin Receptor Blockers Hives    Hives ??  . Latex     rash  . Penicillins Hives  . Shellfish-Derived Products Hives and Rash  . Zestril (Lisinopril) Hives  . Spironolactone     Intermittent renal insufficiency  . Coreg (Carvedilol) Hives  . Morphine And Related Itching    FAMILY HISTORY:  Family History  Problem Relation Age of Onset  . Coronary artery disease Father     no MI  . Stroke Mother     in 66s  . Diabetes Mother   . Ovarian cancer Sister     twin sibling  . Alcohol abuse Maternal Grandfather   . Hypothyroidism Sister     twin  . Goiter Sister     resected  . COPD Father   . Breast cancer Sister   . Irritable bowel syndrome Sister    SOCIAL HISTORY:  reports that she has never smoked. She has never used smokeless tobacco. She reports that she does not drink alcohol or use illicit drugs.  REVIEW OF SYSTEMS:   See HPI for pertinent positives, all others reviewed and negative.   SUBJECTIVE: "I am scared I have cancer"  VITAL SIGNS: Temp:  [98.4 F (36.9 C)-99.6 F (37.6 C)] 98.6 F (37 C) (04/30  1311) Pulse Rate:  [67-77] 67 (04/30 1311) Resp:  [18] 18 (04/30 1311) BP: (145-181)/(42-93) 152/42 mmHg (04/30 1311) SpO2:  [90 %-96 %] 93 % (04/30 1321) Weight:  [274 lb 6.4 oz (124.467 kg)] 274 lb 6.4 oz (124.467 kg) (04/29 2052)  PHYSICAL EXAMINATION: General:  Morbidly obese female in NAD Neuro:  AAOx4, MAE HEENT:  Mm pink/moist, short thick neck Cardiovascular:  s1s2 rrr, no appreciable m/r/g Lungs:  resp's even/non-labored on RA, lungs bilaterally diminished but clear Abdomen:  Obese/soft, bsx4 active Musculoskeletal:  No acute deformities Skin:  Warm/dry, no edema, patchy eczema on BLE   Recent Labs Lab 08/19/12 1705 08/19/12 2153 08/21/12 0704 08/22/12 0725  NA 137  --  134* 137  K 4.7  --  4.8 4.7  CL 101  --  102 103  CO2 24  --  20 23  BUN 30*  --  25* 25*  CREATININE 1.70* 1.62* 1.45* 1.43*  GLUCOSE 80  --  229* 187*    Recent Labs Lab 08/19/12 2153 08/21/12 0704 08/22/12 0725  HGB 12.2 11.1* 11.2*  HCT 36.8 34.2* 34.2*  WBC 20.8* 15.0* 14.6*  PLT 273 275 305   No results found.  ASSESSMENT / PLAN:  HAP - bilateral infiltrates noted on CT, unable to rule out mass due to infiltrate.  Concern for opportunistic infection vs aspiration vs autoimmune process such as hypersensitivity pneumonitis given her history of intermittent steroid usage - most recent one month PTA.  No real hemoptysis.  Has had L>R lower ext swelling but I do not appreciate this on exam.  D-dimer may be elevated in the presence of infection. Wells score of 3 (unlikely for PE). Cultures negative to date.     Plan: -continue rocephin / azithro -recommend 5 days IV abx minimum, then can change to Augmentin for completion of 8 days abx total -will need repeat CT scan 1 month post abx completion to ensure no mass and clearing of PNA -follow  up with Pulmonary in office in one month -no indication for bronch at this time.  -assess LE dopplers for DVT -follow PCT -may require steroid  burst pending autoimmune panel -swallow evaluation per SLP   Canary Brim, NP-C [facility] Pgr: [phone] or [phone]  08/22/2012, 2:19 PM  Patient seen and examined, agree with above note.  I dictated the care and orders written for this patient under my direction.  Alyson Reedy, MD [phone]

## 2012-08-22 NOTE — Telephone Encounter (Signed)
Left message to call office. Pt has been in hosp so message was held until Discharge which was today.

## 2012-08-22 NOTE — Progress Notes (Signed)
Physician Discharge Summary  Valerie Stevenson MRN: [id number] DOB/AGE: 04-25-1946 67 y.o.  PCP: Marga Melnick, MD   Admit date: 08/19/2012 Discharge date: 08/22/2012  Discharge Diagnoses:      HYPERLIPIDEMIA   DEPRESSION   HYPERTENSION   ASTHMA   Obstructive sleep apnea   Type 2 diabetes, uncontrolled, with renal manifestation   Morbid obesity   Renal insufficiency   CAP (community acquired pneumonia)  Discharge Condition: Stable   Disposition: Home   Consults:    Significant Diagnostic Studies: Dg Chest 2 View  08/19/2012  *RADIOLOGY REPORT*  Clinical Data: Cough, fever and shortness of breath for the past 2- 3 weeks.  CHEST - 2 VIEW  Comparison: Chest x-ray 10/13/2011.  Findings: There are multifocal areas of airspace disease in the lungs bilaterally, most confluent in the left upper lobe and in the superior segment of the right lower lobe.  Possible trace bilateral pleural effusions (left greater than right).  Mild diffuse interstitial prominence and peribronchial cuffing.  Mild engorgement of the pulmonary vasculature, without frank pulmonary edema.  Mild cardiomegaly. The patient is rotated to the right on today's exam, resulting in distortion of the mediastinal contours and reduced diagnostic sensitivity and specificity for mediastinal pathology.  Atherosclerosis in the thoracic aorta.  Left hilar fullness.  IMPRESSION: 1.  Multifocal areas of apparent airspace consolidation in the lungs bilaterally, concerning for multilobar pneumonia.  The appearance of the left upper lobe in particular is somewhat unusual and slightly mass-like in appearance.  Additionally, there is left hilar fullness.  The possibility of centrally obstructing neoplasm around the left hilum, or a left upper lobe neoplasm with left hilar adenopathy is difficult to entirely exclude.  Clinical correlation is recommended.  At the very least, a follow-up PA and lateral chest radiograph in 2-3 weeks after  appropriate trial of antimicrobial therapy is strongly recommended.  If for some reason there is strong clinical concern for neoplasm at this time, further evaluation with a contrast enhanced chest CT would provide additional diagnostic information. 2.  Mild cardiomegaly with pulmonary venous congestion, but no frank pulmonary edema. 3.  Atherosclerosis.   Original Report Authenticated By: Trudie Reed, M.D.    Ct Chest Wo Contrast  08/20/2012  *RADIOLOGY REPORT*  Clinical Data: Cough for 1 week.  CT CHEST WITHOUT CONTRAST  Technique:  Multidetector CT imaging of the chest was performed following the standard protocol without IV contrast.  Comparison: 08/19/2012 chest x-ray  Findings: Thyroid gland is diffusely heterogeneous with asymmetric enlargement of the right lobe.  This is been evaluated previously with multiple nuclear medicine studies performed and 2012.  No axillary lymphadenopathy.  There is mediastinal lymphadenopathy with a 11 mm short-axis right paratracheal lymph node and a 10 mm short-axis AP window lymph node.  10 mm short-axis subcarinal lymph node is evident.  The heart is mildly enlarged.  No pericardial effusion.  Tiny left pleural effusion noted.  Lung windows demonstrate focal airspace consolidation in the left upper lobe, following a segmental distribution.  There is focal airspace consolidation in the posterior right lower lobe with patchy areas of central airspace disease in the left lower lobe. Apparent small pleural plaque in the anterior right hemithorax contains calcification.  Bone windows reveal no worrisome lytic or sclerotic osseous lesions.  IMPRESSION: Multifocal airspace consolidation to suggest pneumonia although neoplasm could have this appearance.  Small left pleural effusion.   Original Report Authenticated By: Kennith Center, M.D.        Microbiology:  Recent Results (from the past 240 hour(s))  CULTURE, BLOOD (ROUTINE X 2)     Status: None   Collection Time     08/19/12  6:11 PM      Result Value Range Status   Specimen Description BLOOD RIGHT HAND   Final   Special Requests BOTTLES DRAWN AEROBIC AND ANAEROBIC 5CC EACH   Final   Culture  Setup Time 08/20/2012 01:33   Final   Culture     Final   Value:        BLOOD CULTURE RECEIVED NO GROWTH TO DATE CULTURE WILL BE HELD FOR 5 DAYS BEFORE ISSUING A FINAL NEGATIVE REPORT   Report Status PENDING   Incomplete  CULTURE, BLOOD (ROUTINE X 2)     Status: None   Collection Time    08/19/12  7:16 PM      Result Value Range Status   Specimen Description BLOOD LEFT ARM   Final   Special Requests BOTTLES DRAWN AEROBIC AND ANAEROBIC 5CC EACH   Final   Culture  Setup Time 08/20/2012 01:33   Final   Culture     Final   Value:        BLOOD CULTURE RECEIVED NO GROWTH TO DATE CULTURE WILL BE HELD FOR 5 DAYS BEFORE ISSUING A FINAL NEGATIVE REPORT   Report Status PENDING   Incomplete     Labs: Results for orders placed during the hospital encounter of 08/19/12 (from the past 48 hour(s))  GLUCOSE, CAPILLARY     Status: Abnormal   Collection Time    08/20/12  5:02 PM      Result Value Range   Glucose-Capillary 155 (*) 70 - 99 mg/dL   Comment 1 Notify RN     Comment 2 Documented in Chart    GLUCOSE, CAPILLARY     Status: Abnormal   Collection Time    08/20/12  8:54 PM      Result Value Range   Glucose-Capillary 245 (*) 70 - 99 mg/dL  CBC     Status: Abnormal   Collection Time    08/21/12  7:04 AM      Result Value Range   WBC 15.0 (*) 4.0 - 10.5 K/uL   RBC 3.85 (*) 3.87 - 5.11 MIL/uL   Hemoglobin 11.1 (*) 12.0 - 15.0 g/dL   HCT 16.1 (*) 09.6 - 04.5 %   MCV 88.8  78.0 - 100.0 fL   MCH 28.8  26.0 - 34.0 pg   MCHC 32.5  30.0 - 36.0 g/dL   RDW 40.9  81.1 - 91.4 %   Platelets 275  150 - 400 K/uL  BASIC METABOLIC PANEL     Status: Abnormal   Collection Time    08/21/12  7:04 AM      Result Value Range   Sodium 134 (*) 135 - 145 mEq/L   Potassium 4.8  3.5 - 5.1 mEq/L   Chloride 102  96 - 112 mEq/L    CO2 20  19 - 32 mEq/L   Glucose, Bld 229 (*) 70 - 99 mg/dL   BUN 25 (*) 6 - 23 mg/dL   Creatinine, Ser 7.82 (*) 0.50 - 1.10 mg/dL   Calcium 9.5  8.4 - 95.6 mg/dL   GFR calc non Af Amer 36 (*) >90 mL/min   GFR calc Af Amer 42 (*) >90 mL/min   Comment:            The eGFR has been calculated  using the CKD EPI equation.     This calculation has not been     validated in all clinical     situations.     eGFR's persistently     <90 mL/min signify     possible Chronic Kidney Disease.  GLUCOSE, CAPILLARY     Status: Abnormal   Collection Time    08/21/12  7:44 AM      Result Value Range   Glucose-Capillary 193 (*) 70 - 99 mg/dL  GLUCOSE, CAPILLARY     Status: Abnormal   Collection Time    08/21/12 11:47 AM      Result Value Range   Glucose-Capillary 194 (*) 70 - 99 mg/dL  GLUCOSE, CAPILLARY     Status: Abnormal   Collection Time    08/21/12  4:46 PM      Result Value Range   Glucose-Capillary 231 (*) 70 - 99 mg/dL  GLUCOSE, CAPILLARY     Status: Abnormal   Collection Time    08/21/12  8:51 PM      Result Value Range   Glucose-Capillary 160 (*) 70 - 99 mg/dL  CBC     Status: Abnormal   Collection Time    08/22/12  7:25 AM      Result Value Range   WBC 14.6 (*) 4.0 - 10.5 K/uL   RBC 3.89  3.87 - 5.11 MIL/uL   Hemoglobin 11.2 (*) 12.0 - 15.0 g/dL   HCT 16.1 (*) 09.6 - 04.5 %   MCV 87.9  78.0 - 100.0 fL   MCH 28.8  26.0 - 34.0 pg   MCHC 32.7  30.0 - 36.0 g/dL   RDW 40.9  81.1 - 91.4 %   Platelets 305  150 - 400 K/uL  BASIC METABOLIC PANEL     Status: Abnormal   Collection Time    08/22/12  7:25 AM      Result Value Range   Sodium 137  135 - 145 mEq/L   Potassium 4.7  3.5 - 5.1 mEq/L   Chloride 103  96 - 112 mEq/L   CO2 23  19 - 32 mEq/L   Glucose, Bld 187 (*) 70 - 99 mg/dL   BUN 25 (*) 6 - 23 mg/dL   Creatinine, Ser 7.82 (*) 0.50 - 1.10 mg/dL   Calcium 9.8  8.4 - 95.6 mg/dL   GFR calc non Af Amer 37 (*) >90 mL/min   GFR calc Af Amer 43 (*) >90 mL/min   Comment:             The eGFR has been calculated     using the CKD EPI equation.     This calculation has not been     validated in all clinical     situations.     eGFR's persistently     <90 mL/min signify     possible Chronic Kidney Disease.  GLUCOSE, CAPILLARY     Status: Abnormal   Collection Time    08/22/12  8:09 AM      Result Value Range   Glucose-Capillary 171 (*) 70 - 99 mg/dL     HPI : Valerie Stevenson is an 67 y.o. female with hx of DM, CRI, toxic multinodular goiter, anemia, COPD, asthma, sleep apnea on CPAP, HTN, GERD, hyperthyroidism, presents to [facility] with cough for about a week. She has some blood tingued sputum. There has been no shortness of breath or chest pain. No fever, chills, or diaphoresis. Evaluation in  the ER included a CXR which showed multilobular infiltrate, but cannot exclude a pulmonary mass. Radiologist recommended at least f/up CXR after Tx for CAP, or CT chest with contrast. She does have CRI and her Cr is 1.7. Her WBC is 20K. She is stable, and hospitalist was asked to admit for multilobar PNA with abnormal CXR needing further work up.  HOSPITAL COURSE:  Assessment/Plan:  1. Multifocal pneumonia -clinically improving, afebrile and WBC trending down  -continue IV rocephin/zithromax Day 3 Concern about atypical infection/pulmonary embolism according to Dr. Alwyn Ren, from his note   on 4/28 We'll do a VQ scan, chest x-ray, d-dimer -influenza PCR negative  -urine legionella pending, pneumococcal Ag negative  -nebs for pulm toilet  - Fu CT chest or CXR to ensure resolution  -OOB to chair and Ambulate  -weaned off O2   2. Asthma:  -stable, not wheezing at this time  -albuterol PRN, singulair   3. DM: stable  - continue Lantus, SSI  4. HTN:  -continue amlodipine, bystolic  DVT proph: heparin SQ    Discharge Exam: Blood pressure 178/43, pulse 72, temperature 98.8 F (37.1 C), temperature source Oral, resp. rate 18, height 5' 5.5" (1.664 m), weight  124.467 kg (274 lb 6.4 oz), SpO2 93.00%.   GEN: Pleasant patient lying in the stretcher in no acute distress; cooperative with exam. She is morbidly obese.  PSYCH: alert and oriented x4; does not appear anxious or depressed; affect is appropriate.  HEENT: Mucous membranes pink and anicteric; PERRLA; EOM intact; no cervical lymphadenopathy nor thyromegaly or carotid bruit; no JVD; There were no stridor. Neck is very supple.  Breasts:: Not examined  CHEST WALL: No tenderness  CHEST: Normal respiration, clear to auscultation bilaterally.  HEART: Regular rate and rhythm. There are no murmur, rub, or gallops.  BACK: No kyphosis or scoliosis; no CVA tenderness  ABDOMEN: soft and non-tender; no masses, no organomegaly, normal abdominal bowel sounds; no pannus; no intertriginous candida. There is no rebound and no distention.  Rectal Exam: Not done  EXTREMITIES: No bone or joint deformity; age-appropriate arthropathy of the hands and knees; no edema; no ulcerations. There is no calf tenderness.        Future Appointments Provider Department Dept Phone   10/19/2012 8:15 AM Romero Belling, MD [facility] PRIMARY CARE ENDOCRINOLOGY [phone]   06/21/2013 10:00 AM Waymon Budge, MD [facility] [phone]      Follow-up Information   Follow up with Marga Melnick, MD. Schedule an appointment as soon as possible for a visit in 1 week.   Contact information:   [address] [place] [address] [phone]       Signed: Richarda Overlie 08/22/2012, 12:03 PM

## 2012-08-22 NOTE — Progress Notes (Signed)
Pt's o2 on rom air while ambulating was 93%. Pt denies any distress. Will cont to monitor.

## 2012-08-22 NOTE — Progress Notes (Signed)
Inpatient Diabetes Program Recommendations  AACE/ADA: New Consensus Statement on Inpatient Glycemic Control (2013)  Target Ranges:  Prepandial:   less than 140 mg/dL      Peak postprandial:   less than 180 mg/dL (1-2 hours)      Critically ill patients:  140 - 180 mg/dL   Results for Valerie Stevenson, Valerie Stevenson (MRN [id number]) as of 08/22/2012 10:37  Ref. Range 08/21/2012 07:44 08/21/2012 11:47 08/21/2012 16:46 08/21/2012 20:51 08/22/2012 08:09  Glucose-Capillary Latest Range: 70-99 mg/dL 846 (H) 962 (H) 952 (H) 160 (H) 171 (H)    Inpatient Diabetes Program Recommendations Insulin - Basal: Please consider increasing Lantus to 60 units QHS.  Note: Blood glucose has ranged from 160-231 mg/dl over the past 24 hours.  Please consider increasing basal insulin to improve glycemic control.  Recommend Lantus 60 units QHS.  Will continue to follow.  Thanks, Orlando Penner, RN, BSN, CCRN Diabetes Coordinator Inpatient Diabetes Program [phone]

## 2012-08-22 NOTE — Telephone Encounter (Signed)
Pt notified, states her niece was just helping her out by informing our office about the pt's admission.

## 2012-08-23 ENCOUNTER — Telehealth: Payer: Self-pay | Admitting: Internal Medicine

## 2012-08-23 DIAGNOSIS — M7989 Other specified soft tissue disorders: Secondary | ICD-10-CM

## 2012-08-23 DIAGNOSIS — M79609 Pain in unspecified limb: Secondary | ICD-10-CM

## 2012-08-23 LAB — ANGIOTENSIN CONVERTING ENZYME: Angiotensin-Converting Enzyme: 40 U/L (ref 8–52)

## 2012-08-23 LAB — GLUCOSE, CAPILLARY
Glucose-Capillary: 188 mg/dL — ABNORMAL HIGH (ref 70–99)
Glucose-Capillary: 164 mg/dL — ABNORMAL HIGH (ref 70–99)
Glucose-Capillary: 173 mg/dL — ABNORMAL HIGH (ref 70–99)
Glucose-Capillary: 167 mg/dL — ABNORMAL HIGH (ref 70–99)

## 2012-08-23 LAB — ANA: Anti Nuclear Antibody(ANA): NEGATIVE

## 2012-08-23 MED ORDER — MIDAZOLAM HCL 2 MG/2ML IJ SOLN
INTRAMUSCULAR | Status: AC
  Filled 2012-08-23: qty 2

## 2012-08-23 MED ORDER — CLONIDINE HCL 0.1 MG PO TABS
0.1000 mg | ORAL_TABLET | Freq: Three times a day (TID) | ORAL | Status: DC
  Administered 2012-08-23 – 2012-08-26 (×9): 0.1 mg via ORAL
  Filled 2012-08-23 (×13): qty 1

## 2012-08-23 MED ORDER — AMLODIPINE BESYLATE 10 MG PO TABS: 10.0000 mg | ORAL_TABLET | Freq: Every day | ORAL | Status: DC

## 2012-08-23 MED ORDER — AMLODIPINE BESYLATE 5 MG PO TABS
5.0000 mg | ORAL_TABLET | Freq: Two times a day (BID) | ORAL | Status: DC
  Administered 2012-08-23 – 2012-08-24 (×2): 5 mg via ORAL
  Filled 2012-08-23 (×4): qty 1

## 2012-08-23 NOTE — Progress Notes (Signed)
PT Cancellation Note  Patient Details Name: Valerie Stevenson MRN: [id number] DOB: Aug 12, 1945   Cancelled Treatment:    Reason Eval/Treat Not Completed: Other (comment) (Pt declined stating that she is "getting around fine") Pt and RN report pt is independent with mobility and has been bathing herself at the sink.   No Acute PT/OT needs at this time.  Acute PT/OT signing off per pt request.     Valor Quaintance 08/23/2012, 2:20 PM Tennile Styles L. Amarilys Lyles DPT [phone]

## 2012-08-23 NOTE — Progress Notes (Signed)
SLP Cancellation Note  Patient Details Name: Valerie Stevenson MRN: [id number] DOB: 04-12-1946   Cancelled treatment:        Unable to complete BSE at this time. Pt leaving unit for VQ scan. Pt with multiple high risk indicators for dysphagia (COPD, GERD, PNA), and is not exhibiting overt s/s aspiration per RN.  Will likely defer bedside evaluation and proceed to objective evaluation, given referral to "rule out aspiration as source of pulmonary infiltrates". ST to continue efforts.  RN aware.  Makail Watling B. Murvin Natal [facility], CCC-SLP [phone] Leigh Aurora 08/23/2012, 9:32 AM

## 2012-08-23 NOTE — Progress Notes (Addendum)
PULMONARY  / CRITICAL CARE MEDICINE  Name: Valerie Stevenson MRN: [id number] DOB: Sep 07, 1945    ADMISSION DATE:  08/19/2012 CONSULTATION DATE:  08/22/12  REFERRING MD :  Dr. Susie Cassette PRIMARY SERVICE:  TR[facility]  CHIEF COMPLAINT:  PNA   BRIEF PATIENT DESCRIPTION: 67 y/o WF admitted on 4/27 with cough, chills / sweats for 2-3 wks.  Initial CXR with LUL, RLL infiltrate.  Further evaluated with CT w/o contrast demonstrated significant infiltrate LUL, RLL not excluding mass.  PCCM consulted for workup.   SIGNIFICANT EVENTS / STUDIES:  4/28 CT Chest w/o>>>Multifocal airspace consolidation to suggest pneumonia although neoplasm could have this appearance. Small left pleural effusion. 5/1 LE Doppler>>>neg prelim  LINES / TUBES:   CULTURES: 4/28 U. Strep>>>neg 4/27 BCx2>>>  ANTIBIOTICS: Rocephin 4/27>>> Azithro 4/27>>    SUBJECTIVE: Pt reports continued dry cough, mild SOB with activity  VITAL SIGNS: Temp:  [98 F (36.7 C)-98.6 F (37 C)] 98 F (36.7 C) (05/01 0552) Pulse Rate:  [57-70] 57 (05/01 0552) Resp:  [16-18] 16 (05/01 0552) BP: (152-179)/(42-56) 179/56 mmHg (05/01 0552) SpO2:  [91 %-96 %] 91 % (05/01 0552) Weight:  [267 lb 14.4 oz (121.519 kg)] 267 lb 14.4 oz (121.519 kg) (04/30 2259)  PHYSICAL EXAMINATION: General:  Morbidly obese female in NAD Neuro:  AAOx4, MAE HEENT:  Mm pink/moist, short thick neck Cardiovascular:  s1s2 rrr, no appreciable m/r/g Lungs:  resp's even/non-labored on RA, lungs bilaterally diminished but clear Abdomen:  Obese/soft, bsx4 active Musculoskeletal:  No acute deformities Skin:  Warm/dry, no edema, patchy eczema on BLE   Recent Labs Lab 08/19/12 1705 08/19/12 2153 08/21/12 0704 08/22/12 0725  NA 137  --  134* 137  K 4.7  --  4.8 4.7  CL 101  --  102 103  CO2 24  --  20 23  BUN 30*  --  25* 25*  CREATININE 1.70* 1.62* 1.45* 1.43*  GLUCOSE 80  --  229* 187*    Recent Labs Lab 08/19/12 2153 08/21/12 0704 08/22/12 0725  HGB 12.2  11.1* 11.2*  HCT 36.8 34.2* 34.2*  WBC 20.8* 15.0* 14.6*  PLT 273 275 305   Dg Chest 2 View  08/22/2012  *RADIOLOGY REPORT*  Clinical Data: Follow up pneumonia.  CHEST - 2 VIEW  Comparison: CT chest 08/20/2012.  Two-view chest x-ray 08/19/2012, 10/13/2011.  Findings: Cardiac silhouette enlarged but stable.  Pulmonary vascularity normal without evidence of pulmonary edema.  Dense airspace consolidation in the left upper lobe inferiorly, unchanged.  Improved aeration in the right lower lobe.  No new pulmonary parenchymal abnormalities.  Stable small left pleural effusion. Degenerative changes and DISH involving the thoracic spine.  IMPRESSION: Stable dense left upper lobe pneumonia with slight improvement in the right lower lobe pneumonia since the CT 2 days ago.  No new abnormalities.   Original Report Authenticated By: Hulan Saas, M.D.     ASSESSMENT / PLAN:  HAP - bilateral infiltrates noted on CT, unable to rule out mass due to infiltrate.  Concern for opportunistic infection vs aspiration vs autoimmune process such as hypersensitivity pneumonitis given her history of intermittent steroid usage - most recent one month PTA.  No real hemoptysis.  Has had L>R lower ext swelling but I do not appreciate this on exam.  D-dimer may be elevated in the presence of infection. Wells score of 3 (unlikely for PE).  LE dopplers negative on prelim.   Cultures negative to date.  Also has large thyroid that is seen on  CT that could be compromising her airway to some degree.   Autoimmune panel essentially negative.   Plan: -continue rocephin / azithro -recommend 5 days IV abx minimum, then can change to Augmentin for completion of 8 days abx total -will need repeat CT scan 1 month post abx completion to ensure no mass and clearing of PNA -follow up with Pulmonary in office in one month (arranged) -no indication for bronch at this time.  -swallow evaluation per SLP  -follow up with Endocrine at time of d/c  for Thyroid -no need for VQ at this time, LE dopplers neg, no chest pain / prolonged periods of rest etc, in NSR    Canary Brim, NP-C [facility] Pgr: [phone] or [phone]  08/23/2012, 10:43 AM  Levy Pupa, MD, PhD 08/23/2012, 1:17 PM [facility] [phone] or if no answer [phone]

## 2012-08-23 NOTE — Progress Notes (Signed)
[facility] PROGRESS NOTE  Valerie Stevenson ZOX:[id number] DOB: 11/21/1945 DOA: 08/19/2012 PCP: Marga Melnick, MD  Assessment/Plan: Active Problems:   HYPERLIPIDEMIA   DEPRESSION   HYPERTENSION   ASTHMA   Obstructive sleep apnea   Type 2 diabetes, uncontrolled, with renal manifestation   Morbid obesity   Renal insufficiency   CAP (community acquired pneumonia)    HPI :  Valerie Stevenson is an 67 y.o. female with hx of DM, CRI, toxic multinodular goiter, anemia, COPD, asthma, sleep apnea on CPAP, HTN, GERD, hyperthyroidism, presents to [facility] with cough for about a week. She has some blood tingued sputum. There has been no shortness of breath or chest pain. No fever, chills, or diaphoresis. Evaluation in the ER included a CXR which showed multilobular infiltrate, but cannot exclude a pulmonary mass. Radiologist recommended at least f/up CXR after Tx for CAP, or CT chest with contrast. She does have CRI and her Cr is 1.7. Her WBC is 20K. She is stable, and hospitalist was asked to admit for multilobar PNA with abnormal CXR needing further work up.  HOSPITAL COURSE:  Assessment/Plan:  1. Multifocal pneumonia -clinically improving, afebrile and WBC trending down  -continue IV rocephin/zithromax Day 3  Concern about atypical infection/pulmonary embolism according to Dr. Alwyn Ren, from his note on 4/28  We'll do a VQ scan, chest x-ray, d-dimer , vascular Doppler of lower extremities -influenza PCR negative  -urine legionella pending, pneumococcal Ag negative  -nebs for pulm toilet  will need repeat CT scan 1 month post abx completion to ensure no mass and clearing of PNA  -follow up with Pulmonary in office in one month  -no indication for bronch at this time.    2. Asthma:  -stable, not wheezing at this time  -albuterol PRN, singulair   3. DM: stable  - continue Lantus, SSI   4. HTN:  -continue amlodipine, bystolic  DVT proph: heparin SQ       5.disposition-anticipate  discharge tomorrow     HPI/Subjective: Anxious denies any shortness of breath  Objective: Filed Vitals:   08/22/12 1703 08/22/12 2112 08/22/12 2259 08/23/12 0552  BP: 156/46 159/48  179/56  Pulse: 58 70  57  Temp: 98.1 F (36.7 C) 98.1 F (36.7 C)  98 F (36.7 C)  TempSrc: Oral Oral  Oral  Resp: 18 18  16   Height:      Weight:   121.519 kg (267 lb 14.4 oz)   SpO2: 95% 96%  91%   No intake or output data in the 24 hours ending 08/23/12 0727  Exam:  General: Morbidly obese female in NAD  Neuro: AAOx4, MAE  HEENT: Mm pink/moist, short thick neck  Cardiovascular: s1s2 rrr, no appreciable m/r/g  Lungs: resp's even/non-labored on RA, lungs bilaterally diminished but clear  Abdomen: Obese/soft, bsx4 active  Musculoskeletal: No acute deformities  Skin: Warm/dry, no edema, patchy eczema on BLE    Data Reviewed: Basic Metabolic Panel:  Recent Labs Lab 08/19/12 1705 08/19/12 2153 08/21/12 0704 08/22/12 0725  NA 137  --  134* 137  K 4.7  --  4.8 4.7  CL 101  --  102 103  CO2 24  --  20 23  GLUCOSE 80  --  229* 187*  BUN 30*  --  25* 25*  CREATININE 1.70* 1.62* 1.45* 1.43*  CALCIUM 9.6  --  9.5 9.8    Liver Function Tests: No results found for this basename: AST, ALT, ALKPHOS, BILITOT, PROT, ALBUMIN,  in  the last 168 hours No results found for this basename: LIPASE, AMYLASE,  in the last 168 hours No results found for this basename: AMMONIA,  in the last 168 hours  CBC:  Recent Labs Lab 08/19/12 1705 08/19/12 2153 08/21/12 0704 08/22/12 0725  WBC 20.6* 20.8* 15.0* 14.6*  NEUTROABS 16.4*  --   --   --   HGB 12.4 12.2 11.1* 11.2*  HCT 38.1 36.8 34.2* 34.2*  MCV 92.3 88.5 88.8 87.9  PLT 271 273 275 305    Cardiac Enzymes:  Recent Labs Lab 08/19/12 1705  TROPONINI <0.30   BNP (last 3 results)  Recent Labs  07/18/12 1005 08/19/12 1705  PROBNP 198.0* 1833.0*     CBG:  Recent Labs Lab 08/22/12 0809 08/22/12 1150 08/22/12 1702  08/22/12 2109 08/23/12 0141  GLUCAP 171* 185* 188* 164* 171*    Recent Results (from the past 240 hour(s))  CULTURE, BLOOD (ROUTINE X 2)     Status: None   Collection Time    08/19/12  6:11 PM      Result Value Range Status   Specimen Description BLOOD RIGHT HAND   Final   Special Requests BOTTLES DRAWN AEROBIC AND ANAEROBIC 5CC EACH   Final   Culture  Setup Time 08/20/2012 01:33   Final   Culture     Final   Value:        BLOOD CULTURE RECEIVED NO GROWTH TO DATE CULTURE WILL BE HELD FOR 5 DAYS BEFORE ISSUING A FINAL NEGATIVE REPORT   Report Status PENDING   Incomplete  CULTURE, BLOOD (ROUTINE X 2)     Status: None   Collection Time    08/19/12  7:16 PM      Result Value Range Status   Specimen Description BLOOD LEFT ARM   Final   Special Requests BOTTLES DRAWN AEROBIC AND ANAEROBIC 5CC EACH   Final   Culture  Setup Time 08/20/2012 01:33   Final   Culture     Final   Value:        BLOOD CULTURE RECEIVED NO GROWTH TO DATE CULTURE WILL BE HELD FOR 5 DAYS BEFORE ISSUING A FINAL NEGATIVE REPORT   Report Status PENDING   Incomplete     Studies: Dg Chest 2 View  08/22/2012  *RADIOLOGY REPORT*  Clinical Data: Follow up pneumonia.  CHEST - 2 VIEW  Comparison: CT chest 08/20/2012.  Two-view chest x-ray 08/19/2012, 10/13/2011.  Findings: Cardiac silhouette enlarged but stable.  Pulmonary vascularity normal without evidence of pulmonary edema.  Dense airspace consolidation in the left upper lobe inferiorly, unchanged.  Improved aeration in the right lower lobe.  No new pulmonary parenchymal abnormalities.  Stable small left pleural effusion. Degenerative changes and DISH involving the thoracic spine.  IMPRESSION: Stable dense left upper lobe pneumonia with slight improvement in the right lower lobe pneumonia since the CT 2 days ago.  No new abnormalities.   Original Report Authenticated By: Hulan Saas, M.D.    Dg Chest 2 View  08/19/2012  *RADIOLOGY REPORT*  Clinical Data: Cough, fever  and shortness of breath for the past 2- 3 weeks.  CHEST - 2 VIEW  Comparison: Chest x-ray 10/13/2011.  Findings: There are multifocal areas of airspace disease in the lungs bilaterally, most confluent in the left upper lobe and in the superior segment of the right lower lobe.  Possible trace bilateral pleural effusions (left greater than right).  Mild diffuse interstitial prominence and peribronchial cuffing.  Mild engorgement of the  pulmonary vasculature, without frank pulmonary edema.  Mild cardiomegaly. The patient is rotated to the right on today's exam, resulting in distortion of the mediastinal contours and reduced diagnostic sensitivity and specificity for mediastinal pathology.  Atherosclerosis in the thoracic aorta.  Left hilar fullness.  IMPRESSION: 1.  Multifocal areas of apparent airspace consolidation in the lungs bilaterally, concerning for multilobar pneumonia.  The appearance of the left upper lobe in particular is somewhat unusual and slightly mass-like in appearance.  Additionally, there is left hilar fullness.  The possibility of centrally obstructing neoplasm around the left hilum, or a left upper lobe neoplasm with left hilar adenopathy is difficult to entirely exclude.  Clinical correlation is recommended.  At the very least, a follow-up PA and lateral chest radiograph in 2-3 weeks after appropriate trial of antimicrobial therapy is strongly recommended.  If for some reason there is strong clinical concern for neoplasm at this time, further evaluation with a contrast enhanced chest CT would provide additional diagnostic information. 2.  Mild cardiomegaly with pulmonary venous congestion, but no frank pulmonary edema. 3.  Atherosclerosis.   Original Report Authenticated By: Trudie Reed, M.D.    Ct Chest Wo Contrast  08/20/2012  *RADIOLOGY REPORT*  Clinical Data: Cough for 1 week.  CT CHEST WITHOUT CONTRAST  Technique:  Multidetector CT imaging of the chest was performed following the  standard protocol without IV contrast.  Comparison: 08/19/2012 chest x-ray  Findings: Thyroid gland is diffusely heterogeneous with asymmetric enlargement of the right lobe.  This is been evaluated previously with multiple nuclear medicine studies performed and 2012.  No axillary lymphadenopathy.  There is mediastinal lymphadenopathy with a 11 mm short-axis right paratracheal lymph node and a 10 mm short-axis AP window lymph node.  10 mm short-axis subcarinal lymph node is evident.  The heart is mildly enlarged.  No pericardial effusion.  Tiny left pleural effusion noted.  Lung windows demonstrate focal airspace consolidation in the left upper lobe, following a segmental distribution.  There is focal airspace consolidation in the posterior right lower lobe with patchy areas of central airspace disease in the left lower lobe. Apparent small pleural plaque in the anterior right hemithorax contains calcification.  Bone windows reveal no worrisome lytic or sclerotic osseous lesions.  IMPRESSION: Multifocal airspace consolidation to suggest pneumonia although neoplasm could have this appearance.  Small left pleural effusion.   Original Report Authenticated By: Kennith Center, M.D.     Scheduled Meds: . amLODipine  5 mg Oral Daily  . antiseptic oral rinse  15 mL Mouth Rinse BID  . aspirin EC  81 mg Oral Daily  . azithromycin  500 mg Intravenous Q24H  . cefTRIAXone (ROCEPHIN)  IV  1 g Intravenous Q24H  . cholecalciferol  1,000 Units Oral Daily  . cloNIDine  0.1 mg Oral BID  . docusate sodium  100 mg Oral BID  . famotidine  20 mg Oral BID  . heparin  5,000 Units Subcutaneous Q8H  . insulin aspart  0-20 Units Subcutaneous TID WC  . insulin aspart  0-5 Units Subcutaneous QHS  . insulin aspart  6 Units Subcutaneous TID WC  . insulin glargine  55 Units Subcutaneous QHS  . loratadine  10 mg Oral Daily  . montelukast  10 mg Oral QHS  . nebivolol  15 mg Oral Daily  . saccharomyces boulardii  250 mg Oral Daily   . simvastatin  20 mg Oral q1800   Continuous Infusions: . sodium chloride 10 mL/hr at 08/21/12 1333  Active Problems:   HYPERLIPIDEMIA   DEPRESSION   HYPERTENSION   ASTHMA   Obstructive sleep apnea   Type 2 diabetes, uncontrolled, with renal manifestation   Morbid obesity   Renal insufficiency   CAP (community acquired pneumonia)    Time spent: 40 minutes   [facility]  [facility] Pager [phone]. If 8PM-8AM, please contact night-coverage at [email or web address], password [facility] 08/23/2012, 7:27 AM  LOS: 4 days

## 2012-08-23 NOTE — Telephone Encounter (Signed)
Valerie Stevenson at 08/23/2012 9:23 AM    Status: Signed             Pt called and stated that they receive a phone call yesterday but missed it and she thinks it was Kea Callan. thanks

## 2012-08-23 NOTE — Telephone Encounter (Signed)
Discuss with patient  

## 2012-08-23 NOTE — Progress Notes (Signed)
VASCULAR LAB PRELIMINARY  PRELIMINARY  PRELIMINARY  PRELIMINARY  Bilateral lower extremity venous Dopplers completed.    Preliminary report:  No DVT or SVT noted in the bilateral lower extremities.  Carlo Lorson, RVT 08/23/2012, 12:13 PM

## 2012-08-23 NOTE — Telephone Encounter (Signed)
See other encounter.

## 2012-08-23 NOTE — Telephone Encounter (Signed)
Pt called and stated that they receive a phone call yesterday but missed it and she thinks it was felicia. thanks

## 2012-08-24 ENCOUNTER — Inpatient Hospital Stay (HOSPITAL_COMMUNITY): Payer: Medicare Other

## 2012-08-24 LAB — CBC
MCH: 28.8 pg (ref 26.0–34.0)
MCHC: 32.6 g/dL (ref 30.0–36.0)
MCV: 88.2 fL (ref 78.0–100.0)
Platelets: 315 10*3/uL (ref 150–400)
RBC: 3.82 MIL/uL — ABNORMAL LOW (ref 3.87–5.11)
RDW: 13.6 % (ref 11.5–15.5)

## 2012-08-24 MED ORDER — LORATADINE 10 MG PO TABS
10.0000 mg | ORAL_TABLET | Freq: Every day | ORAL | Status: DC
  Administered 2012-08-25 – 2012-08-26 (×2): 10 mg via ORAL
  Filled 2012-08-24 (×2): qty 1

## 2012-08-24 MED ORDER — ASPIRIN EC 81 MG PO TBEC
81.0000 mg | DELAYED_RELEASE_TABLET | Freq: Every day | ORAL | Status: DC
  Filled 2012-08-24: qty 1

## 2012-08-24 MED ORDER — LEVALBUTEROL HCL 1.25 MG/0.5ML IN NEBU
1.2500 mg | INHALATION_SOLUTION | Freq: Three times a day (TID) | RESPIRATORY_TRACT | Status: DC
  Administered 2012-08-24 (×2): 1.25 mg via RESPIRATORY_TRACT
  Filled 2012-08-24 (×3): qty 0.5

## 2012-08-24 MED ORDER — BUDESONIDE-FORMOTEROL FUMARATE 80-4.5 MCG/ACT IN AERO
2.0000 | INHALATION_SPRAY | Freq: Two times a day (BID) | RESPIRATORY_TRACT | Status: DC
  Administered 2012-08-24 – 2012-08-25 (×3): 2 via RESPIRATORY_TRACT
  Filled 2012-08-24 (×3): qty 6.9

## 2012-08-24 MED ORDER — LEVALBUTEROL HCL 1.25 MG/0.5ML IN NEBU
1.2500 mg | INHALATION_SOLUTION | Freq: Three times a day (TID) | RESPIRATORY_TRACT | Status: DC
  Administered 2012-08-25 (×3): 1.25 mg via RESPIRATORY_TRACT
  Filled 2012-08-24 (×7): qty 0.5

## 2012-08-24 MED ORDER — AMLODIPINE BESYLATE 5 MG PO TABS
5.0000 mg | ORAL_TABLET | Freq: Two times a day (BID) | ORAL | Status: DC
  Administered 2012-08-24 – 2012-08-26 (×4): 5 mg via ORAL
  Filled 2012-08-24 (×5): qty 1

## 2012-08-24 MED ORDER — SIMVASTATIN 5 MG PO TABS
5.0000 mg | ORAL_TABLET | Freq: Every day | ORAL | Status: DC
  Administered 2012-08-24 – 2012-08-25 (×2): 5 mg via ORAL
  Filled 2012-08-24 (×3): qty 1

## 2012-08-24 MED ORDER — LORAZEPAM 0.5 MG PO TABS: 0.5000 mg | ORAL_TABLET | Freq: Every evening | ORAL | Status: DC | PRN

## 2012-08-24 MED ORDER — GUAIFENESIN-CODEINE 100-10 MG/5ML PO SOLN
10.0000 mL | Freq: Three times a day (TID) | ORAL | Status: DC
  Administered 2012-08-24 – 2012-08-26 (×6): 10 mL via ORAL
  Filled 2012-08-24 (×6): qty 10

## 2012-08-24 MED ORDER — ASPIRIN EC 81 MG PO TBEC
81.0000 mg | DELAYED_RELEASE_TABLET | Freq: Every day | ORAL | Status: DC
  Administered 2012-08-25: 81 mg via ORAL
  Filled 2012-08-24 (×3): qty 1

## 2012-08-24 MED ORDER — FUROSEMIDE 40 MG PO TABS
40.0000 mg | ORAL_TABLET | Freq: Every day | ORAL | Status: DC | PRN
  Filled 2012-08-24: qty 1

## 2012-08-24 NOTE — Progress Notes (Signed)
Utilization review completed.  

## 2012-08-24 NOTE — Progress Notes (Signed)
cather

## 2012-08-24 NOTE — Progress Notes (Signed)
Order received, chart reviewed, noted that per PT note yesterday pt stated she was doing fine. Went to her room today and she was up walking into the bathroom without issues. No OT needs identified, will sign off. Ignacia Palma, OTR/L [phone] 08/24/2012

## 2012-08-24 NOTE — Progress Notes (Addendum)
[facility] PROGRESS NOTE  [name]:[id number] DOB: 06/03/1945 DOA: 08/19/2012 PCP: Marga Melnick, MD  Assessment/Plan: Active Problems:   HYPERLIPIDEMIA   DEPRESSION   HYPERTENSION   ASTHMA   Obstructive sleep apnea   Type 2 diabetes, uncontrolled, with renal manifestation   Morbid obesity   Renal insufficiency   CAP (community acquired pneumonia)      HPI :  Valerie Stevenson is an 67 y.o. female with hx of DM, CRI, toxic multinodular goiter, anemia, COPD, asthma, sleep apnea on CPAP, HTN, GERD, hyperthyroidism, presents to [facility] with cough for about a week. She has some blood tingued sputum. There has been no shortness of breath or chest pain. No fever, chills, or diaphoresis. Evaluation in the ER included a CXR which showed multilobular infiltrate, but cannot exclude a pulmonary mass. Radiologist recommended at least f/up CXR after Tx for CAP, or CT chest with contrast. She does have CRI and her Cr is 1.7. Her WBC is 20K. She is stable, and hospitalist was asked to admit for multilobar PNA with abnormal CXR needing further work up.    HOSPITAL COURSE:  Assessment/Plan:  1. Multifocal pneumonia -clinically improving, afebrile and WBC trending down  -continue IV rocephin/zithromax Day 3  Concern about atypical infection/pulmonary embolism according to Dr. Alwyn Ren, from his note on 4/28  We'll do a VQ scan, chest x-ray, d-dimer , vascular Doppler of lower extremities  -influenza PCR negative  -urine legionella pending, pneumococcal Ag negative  -nebs for pulm toilet  will need repeat CT scan 1 month post abx completion to ensure no mass and clearing of PNA  -follow up with Pulmonary in office in one month  -no indication for bronch at this time.   2. Asthma:  -stable, not wheezing at this time  -albuterol PRN, singulair   3. DM: stable  - continue Lantus, SSI   4. HTN:  -continue amlodipine, bystolic  DVT proph: heparin SQ   5.disposition-anticipate  discharge tomorrow   HPI/Subjective:  Coughing ,added cough syrup , breathing rx    Objective: Filed Vitals:   08/23/12 1054 08/23/12 2021 08/24/12 0444 08/24/12 0937  BP: 158/46 123/39 154/33 152/40  Pulse: 71 61 57 62  Temp: 97.5 F (36.4 C) 98 F (36.7 C) 98.5 F (36.9 C) 97.8 F (36.6 C)  TempSrc: Oral Oral Axillary Oral  Resp: 18 18 16 16   Height:  5\' 5"  (1.651 m)    Weight:      SpO2: 94% 90% 94% 97%   No intake or output data in the 24 hours ending 08/24/12 1102  Exam:  General: Morbidly obese female in NAD  Neuro: AAOx4, MAE  HEENT: Mm pink/moist, short thick neck  Cardiovascular: s1s2 rrr, no appreciable m/r/g  Lungs: resp's even/non-labored on RA, lungs bilaterally diminished but clear  Abdomen: Obese/soft, bsx4 active  Musculoskeletal: No acute deformities  Skin: Warm/dry, no edema, patchy eczema on BLE     Data Reviewed: Basic Metabolic Panel:  Recent Labs Lab 08/19/12 1705 08/19/12 2153 08/21/12 0704 08/22/12 0725  NA 137  --  134* 137  K 4.7  --  4.8 4.7  CL 101  --  102 103  CO2 24  --  20 23  GLUCOSE 80  --  229* 187*  BUN 30*  --  25* 25*  CREATININE 1.70* 1.62* 1.45* 1.43*  CALCIUM 9.6  --  9.5 9.8    Liver Function Tests: No results found for this basename: AST, ALT, ALKPHOS, BILITOT,  PROT, ALBUMIN,  in the last 168 hours No results found for this basename: LIPASE, AMYLASE,  in the last 168 hours No results found for this basename: AMMONIA,  in the last 168 hours  CBC:  Recent Labs Lab 08/19/12 1705 08/19/12 2153 08/21/12 0704 08/22/12 0725 08/24/12 0515  WBC 20.6* 20.8* 15.0* 14.6* 13.4*  NEUTROABS 16.4*  --   --   --   --   HGB 12.4 12.2 11.1* 11.2* 11.0*  HCT 38.1 36.8 34.2* 34.2* 33.7*  MCV 92.3 88.5 88.8 87.9 88.2  PLT 271 273 275 305 315    Cardiac Enzymes:  Recent Labs Lab 08/19/12 1705  TROPONINI <0.30   BNP (last 3 results)  Recent Labs  07/18/12 1005 08/19/12 1705  PROBNP 198.0* 1833.0*      CBG:  Recent Labs Lab 08/23/12 0804 08/23/12 1201 08/23/12 1742 08/23/12 2153 08/24/12 0809  GLUCAP 173* 194* 167* 257* 178*    Recent Results (from the past 240 hour(s))  CULTURE, BLOOD (ROUTINE X 2)     Status: None   Collection Time    08/19/12  6:11 PM      Result Value Range Status   Specimen Description BLOOD RIGHT HAND   Final   Special Requests BOTTLES DRAWN AEROBIC AND ANAEROBIC 5CC EACH   Final   Culture  Setup Time 08/20/2012 01:33   Final   Culture     Final   Value:        BLOOD CULTURE RECEIVED NO GROWTH TO DATE CULTURE WILL BE HELD FOR 5 DAYS BEFORE ISSUING A FINAL NEGATIVE REPORT   Report Status PENDING   Incomplete  CULTURE, BLOOD (ROUTINE X 2)     Status: None   Collection Time    08/19/12  7:16 PM      Result Value Range Status   Specimen Description BLOOD LEFT ARM   Final   Special Requests BOTTLES DRAWN AEROBIC AND ANAEROBIC 5CC EACH   Final   Culture  Setup Time 08/20/2012 01:33   Final   Culture     Final   Value:        BLOOD CULTURE RECEIVED NO GROWTH TO DATE CULTURE WILL BE HELD FOR 5 DAYS BEFORE ISSUING A FINAL NEGATIVE REPORT   Report Status PENDING   Incomplete     Studies: Dg Chest 2 View  08/22/2012  *RADIOLOGY REPORT*  Clinical Data: Follow up pneumonia.  CHEST - 2 VIEW  Comparison: CT chest 08/20/2012.  Two-view chest x-ray 08/19/2012, 10/13/2011.  Findings: Cardiac silhouette enlarged but stable.  Pulmonary vascularity normal without evidence of pulmonary edema.  Dense airspace consolidation in the left upper lobe inferiorly, unchanged.  Improved aeration in the right lower lobe.  No new pulmonary parenchymal abnormalities.  Stable small left pleural effusion. Degenerative changes and DISH involving the thoracic spine.  IMPRESSION: Stable dense left upper lobe pneumonia with slight improvement in the right lower lobe pneumonia since the CT 2 days ago.  No new abnormalities.   Original Report Authenticated By: Hulan Saas, M.D.    Dg  Chest 2 View  08/19/2012  *RADIOLOGY REPORT*  Clinical Data: Cough, fever and shortness of breath for the past 2- 3 weeks.  CHEST - 2 VIEW  Comparison: Chest x-ray 10/13/2011.  Findings: There are multifocal areas of airspace disease in the lungs bilaterally, most confluent in the left upper lobe and in the superior segment of the right lower lobe.  Possible trace bilateral pleural effusions (left greater than  right).  Mild diffuse interstitial prominence and peribronchial cuffing.  Mild engorgement of the pulmonary vasculature, without frank pulmonary edema.  Mild cardiomegaly. The patient is rotated to the right on today's exam, resulting in distortion of the mediastinal contours and reduced diagnostic sensitivity and specificity for mediastinal pathology.  Atherosclerosis in the thoracic aorta.  Left hilar fullness.  IMPRESSION: 1.  Multifocal areas of apparent airspace consolidation in the lungs bilaterally, concerning for multilobar pneumonia.  The appearance of the left upper lobe in particular is somewhat unusual and slightly mass-like in appearance.  Additionally, there is left hilar fullness.  The possibility of centrally obstructing neoplasm around the left hilum, or a left upper lobe neoplasm with left hilar adenopathy is difficult to entirely exclude.  Clinical correlation is recommended.  At the very least, a follow-up PA and lateral chest radiograph in 2-3 weeks after appropriate trial of antimicrobial therapy is strongly recommended.  If for some reason there is strong clinical concern for neoplasm at this time, further evaluation with a contrast enhanced chest CT would provide additional diagnostic information. 2.  Mild cardiomegaly with pulmonary venous congestion, but no frank pulmonary edema. 3.  Atherosclerosis.   Original Report Authenticated By: Trudie Reed, M.D.    Ct Chest Wo Contrast  08/20/2012  *RADIOLOGY REPORT*  Clinical Data: Cough for 1 week.  CT CHEST WITHOUT CONTRAST   Technique:  Multidetector CT imaging of the chest was performed following the standard protocol without IV contrast.  Comparison: 08/19/2012 chest x-ray  Findings: Thyroid gland is diffusely heterogeneous with asymmetric enlargement of the right lobe.  This is been evaluated previously with multiple nuclear medicine studies performed and 2012.  No axillary lymphadenopathy.  There is mediastinal lymphadenopathy with a 11 mm short-axis right paratracheal lymph node and a 10 mm short-axis AP window lymph node.  10 mm short-axis subcarinal lymph node is evident.  The heart is mildly enlarged.  No pericardial effusion.  Tiny left pleural effusion noted.  Lung windows demonstrate focal airspace consolidation in the left upper lobe, following a segmental distribution.  There is focal airspace consolidation in the posterior right lower lobe with patchy areas of central airspace disease in the left lower lobe. Apparent small pleural plaque in the anterior right hemithorax contains calcification.  Bone windows reveal no worrisome lytic or sclerotic osseous lesions.  IMPRESSION: Multifocal airspace consolidation to suggest pneumonia although neoplasm could have this appearance.  Small left pleural effusion.   Original Report Authenticated By: Kennith Center, M.D.     Scheduled Meds: . amLODipine  5 mg Oral BID  . antiseptic oral rinse  15 mL Mouth Rinse BID  . aspirin EC  81 mg Oral Daily  . azithromycin  500 mg Intravenous Q24H  . cefTRIAXone (ROCEPHIN)  IV  1 g Intravenous Q24H  . cholecalciferol  1,000 Units Oral Daily  . cloNIDine  0.1 mg Oral TID  . docusate sodium  100 mg Oral BID  . famotidine  20 mg Oral BID  . guaiFENesin-codeine  10 mL Oral TID  . heparin  5,000 Units Subcutaneous Q8H  . insulin aspart  0-20 Units Subcutaneous TID WC  . insulin aspart  0-5 Units Subcutaneous QHS  . insulin aspart  6 Units Subcutaneous TID WC  . insulin glargine  55 Units Subcutaneous QHS  . levalbuterol  1.25 mg  Nebulization Q8H  . loratadine  10 mg Oral Daily  . montelukast  10 mg Oral QHS  . nebivolol  15 mg Oral Daily  .  saccharomyces boulardii  250 mg Oral Daily  . simvastatin  20 mg Oral q1800   Continuous Infusions: . sodium chloride 10 mL/hr at 08/21/12 1333    Active Problems:   HYPERLIPIDEMIA   DEPRESSION   HYPERTENSION   ASTHMA   Obstructive sleep apnea   Type 2 diabetes, uncontrolled, with renal manifestation   Morbid obesity   Renal insufficiency   CAP (community acquired pneumonia)    Time spent: 40 minutes   [facility]  [facility] Pager [phone]. If 8PM-8AM, please contact night-coverage at [email or web address], password [facility] 08/24/2012, 11:02 AM  LOS: 5 days

## 2012-08-24 NOTE — Progress Notes (Signed)
Speech Language Pathology T Patient Details Name: Valerie Stevenson MRN: [id number] DOB: Jul 29, 1945 Today's Date: 08/24/2012 Time:  -     Given aspiration risk factors in history and need to rule out aspiration, pt. Scheduled for MBS today at 10:00  Breck Coons [place] M.Ed [employer] [phone]  08/24/2012

## 2012-08-24 NOTE — Procedures (Signed)
Objective Swallowing Evaluation: Modified Barium Swallowing Study  Patient Details  Name: Valerie Stevenson MRN: [id number] Date of Birth: 12-Jun-1945  Today's Date: 08/24/2012 Time: 1005-1025 SLP Time Calculation (min): 20 min  Past Medical History:  Past Medical History  Diagnosis Date  . Diabetes mellitus      IDDM;Dr Everardo All  . Toxic multinodular goiter     Dr Everardo All  . OSA (obstructive sleep apnea)     CPAP;Dr Clint Young  . Glaucoma(365)   . Anemia     iron deficinecy  . Asthma   . COPD (chronic obstructive pulmonary disease)   . Depression   . GERD (gastroesophageal reflux disease)   . Hyperlipidemia   . Hypertension   . Hyperthyroidism     in context of nodule; Tapazole therapy since 2007  . Skin cancer     basal cell  . Diverticulosis of colon 2009  . Urticaria, idiopathic   . Renal disease     insufficiency   -- saw Nephrologist  in [place]..hasn't seen one in 2-3 yrs  . Arthritis   . Eczema   . DM (diabetes mellitus)   . Anxiety   . IBS (irritable bowel syndrome)    Past Surgical History:  Past Surgical History  Procedure Laterality Date  . Total abdominal hysterectomy w/ bilateral salpingoophorectomy      sister with ovarian cancer  . Replacement total knee Left 2003    left ; Dr Sullivan Lone; [place]  . Total knee arthroplasty  10/19/2011    Procedure: TOTAL KNEE ARTHROPLASTY;  Surgeon: Loreta Ave, MD;  Location: [facility] OR;  Service: Orthopedics;  Laterality: Right;  DR MURPHY WANTS 90 MINUTES FOR THIS CASE   . Colonoscopy  2006    Next Due date 2016   HPI:   67 y.o. female with hx of DM, CRI, toxic multinodular goiter, anemia, COPD, asthma, sleep apnea on CPAP, HTN, GERD, hyperthyroidism, presents to [facility] with cough for about a week. She has some blood tingued sputum. CXR which showed multilobular infiltrate, but cannot exclude a pulmonary mass.  Pt. admitted for multilobar PNA with abnormal CXR needing further work up.  MD wants to rule out silent  aspiration.  BSE deferred to [facility]     Assessment / Plan / Recommendation Clinical Impression  Dysphagia Diagnosis: Suspected primary esophageal dysphagia Clinical impression: Oral and pharyngeal phases of swallow were within functional limits.  Esophageal scan revealed what appeared to be residual in mid to distal esophagus (not diagnosed on an MBS).  Pt. demonstrated strong cough 3-4 times during the study without evidence of barium in laryngeal vestibule or trachea.  SLP suspects cough may be due to esophageal residue ("reflux cough") possibly irritating the vagus nerve (?).  Pt. may benefit from GI consult to further assess esophageal function.  SLP recommends continuing regular texture diet and thin liquids.  SLP provided therapeutic intervention of reflux precautions and clinical rationale/explanation.  No further ST needed.    Treatment Recommendation  No treatment recommended at this time    Diet Recommendation Regular;Thin liquid   Liquid Administration via: Straw;Cup Medication Administration: Whole meds with liquid Supervision: Patient able to self feed Compensations: Slow rate;Small sips/bites Postural Changes and/or Swallow Maneuvers: Seated upright 90 degrees;Upright 30-60 min after meal    Other  Recommendations Recommended Consults: Consider GI evaluation Oral Care Recommendations: Oral care BID   Follow Up Recommendations  None    Frequency and Duration        Pertinent Vitals/Pain  SLP Swallow Goals     General HPI:  67 y.o. female with hx of DM, CRI, toxic multinodular goiter, anemia, COPD, asthma, sleep apnea on CPAP, HTN, GERD, hyperthyroidism, presents to [facility] with cough for about a week. She has some blood tingued sputum. CXR which showed multilobular infiltrate, but cannot exclude a pulmonary mass.  Pt. admitted for multilobar PNA with abnormal CXR needing further work up.  MD wants to rule out silent aspiration.  BSE deferred to MBS Type of Study: Modified  Barium Swallowing Study Reason for Referral: Objectively evaluate swallowing function Previous Swallow Assessment: none Diet Prior to this Study: Regular;Thin liquids Temperature Spikes Noted: No Respiratory Status: Room air History of Recent Intubation: No Behavior/Cognition: Pleasant mood;Cooperative;Alert Oral Cavity - Dentition: Adequate natural dentition Oral Motor / Sensory Function: Within functional limits Self-Feeding Abilities: Able to feed self Patient Positioning: Upright in chair Baseline Vocal Quality: Clear Volitional Cough: Strong Volitional Swallow: Able to elicit Pharyngeal Secretions: Not observed secondary MBS    Reason for Referral Objectively evaluate swallowing function   Oral Phase Oral Preparation/Oral Phase Oral Phase: WFL   Pharyngeal Phase Pharyngeal Phase Pharyngeal Phase: Within functional limits  Cervical Esophageal Phase    GO    Cervical Esophageal Phase Cervical Esophageal Phase: [facility]         Darrow Bussing.Ed [employer] [phone]  08/24/2012

## 2012-08-24 NOTE — Progress Notes (Signed)
PULMONARY  / CRITICAL CARE MEDICINE  Name: Valerie Stevenson MRN: [id number] DOB: 1945/07/19    ADMISSION DATE:  08/19/2012 CONSULTATION DATE:  08/22/12  REFERRING MD :  Dr. Susie Cassette PRIMARY SERVICE:  TR[facility]  CHIEF COMPLAINT:  PNA   BRIEF PATIENT DESCRIPTION: 67 y/o WF admitted on 4/27 with cough, chills / sweats for 2-3 wks.  Initial CXR with LUL, RLL infiltrate.  Further evaluated with CT w/o contrast demonstrated significant infiltrate LUL, RLL not excluding mass.  PCCM consulted for workup.   SIGNIFICANT EVENTS / STUDIES:  4/28 CT Chest w/o>>>Multifocal airspace consolidation to suggest pneumonia although neoplasm could have this appearance. Small left pleural effusion. 5/1 LE Doppler>>>neg prelim  LINES / TUBES:   CULTURES: 4/28 U. Strep>>>neg 4/27 BCx2>>>  ANTIBIOTICS: Rocephin 4/27>>> Azithro 4/27>>    SUBJECTIVE:   VITAL SIGNS: Temp:  [97.5 F (36.4 C)-98.5 F (36.9 C)] 98.5 F (36.9 C) (05/02 0444) Pulse Rate:  [57-71] 57 (05/02 0444) Resp:  [16-18] 16 (05/02 0444) BP: (123-158)/(33-46) 154/33 mmHg (05/02 0444) SpO2:  [90 %-94 %] 94 % (05/02 0444)  PHYSICAL EXAMINATION: General:  Morbidly obese female in NAD Neuro:  AAOx4, MAE HEENT:  Mm pink/moist, short thick neck Cardiovascular:  s1s2 rrr, no appreciable m/r/g Lungs:  resp's even/non-labored on RA, lungs bilaterally diminished but clear Abdomen:  Obese/soft, bsx4 active Musculoskeletal:  No acute deformities Skin:  Warm/dry, no edema, patchy eczema on BLE   Recent Labs Lab 08/19/12 1705 08/19/12 2153 08/21/12 0704 08/22/12 0725  NA 137  --  134* 137  K 4.7  --  4.8 4.7  CL 101  --  102 103  CO2 24  --  20 23  BUN 30*  --  25* 25*  CREATININE 1.70* 1.62* 1.45* 1.43*  GLUCOSE 80  --  229* 187*    Recent Labs Lab 08/21/12 0704 08/22/12 0725 08/24/12 0515  HGB 11.1* 11.2* 11.0*  HCT 34.2* 34.2* 33.7*  WBC 15.0* 14.6* 13.4*  PLT 275 305 315   Dg Chest 2 View  08/22/2012  *RADIOLOGY REPORT*   Clinical Data: Follow up pneumonia.  CHEST - 2 VIEW  Comparison: CT chest 08/20/2012.  Two-view chest x-ray 08/19/2012, 10/13/2011.  Findings: Cardiac silhouette enlarged but stable.  Pulmonary vascularity normal without evidence of pulmonary edema.  Dense airspace consolidation in the left upper lobe inferiorly, unchanged.  Improved aeration in the right lower lobe.  No new pulmonary parenchymal abnormalities.  Stable small left pleural effusion. Degenerative changes and DISH involving the thoracic spine.  IMPRESSION: Stable dense left upper lobe pneumonia with slight improvement in the right lower lobe pneumonia since the CT 2 days ago.  No new abnormalities.   Original Report Authenticated By: Hulan Saas, M.D.     ASSESSMENT / PLAN:  HAP - bilateral infiltrates noted on CT, unable to rule out mass due to infiltrate.  Concern for opportunistic infection vs aspiration vs autoimmune process such as hypersensitivity pneumonitis given her history of intermittent steroid usage - most recent one month PTA.  No real hemoptysis.  Has had L>R lower ext swelling but I do not appreciate this on exam.  D-dimer may be elevated in the presence of infection. Wells score of 3 (unlikely for PE).  LE dopplers negative on prelim.   Cultures negative to date.  Also has large thyroid that is seen on CT that could be compromising her airway to some degree.   Autoimmune panel essentially negative.   Plan: -continue rocephin / azithro -recommend  5 days IV abx minimum, then can change to Augmentin for completion of 8 days abx total -will need repeat CT scan 4-6 weeks post abx completion to ensure no mass and clearing of PNA -follow up with Pulmonary in office in one month (arranged) -no indication for bronch at this time.  -swallow evaluation per SLP  -follow up with Endocrine at time of d/c for Thyroid -no need for VQ at this time, LE dopplers neg, no chest pain / prolonged periods of rest etc, in NSR   PCCM will  be available PRN.  Please call for needs / concerns.   Canary Brim, NP-C [facility] Pgr: [phone] or [phone]  Levy Pupa, MD, PhD 08/24/2012, 12:49 PM [place] [facility] [phone] or if no answer [phone]

## 2012-08-25 LAB — GLUCOSE, CAPILLARY
Glucose-Capillary: 161 mg/dL — ABNORMAL HIGH (ref 70–99)
Glucose-Capillary: 210 mg/dL — ABNORMAL HIGH (ref 70–99)
Glucose-Capillary: 244 mg/dL — ABNORMAL HIGH (ref 70–99)

## 2012-08-25 NOTE — Progress Notes (Signed)
[facility] PROGRESS NOTE  SHERRILL BUIKEMA AVW:[id number] DOB: [birth year] DOA: 08/19/2012 PCP: Marga Melnick, MD  Assessment/Plan: Active Problems:   HYPERLIPIDEMIA   DEPRESSION   HYPERTENSION   ASTHMA   Obstructive sleep apnea   Type 2 diabetes, uncontrolled, with renal manifestation   Morbid obesity   Renal insufficiency   CAP (community acquired pneumonia)    HPI :  Valerie FRIEDEN is an 67 y.o. female with hx of DM, CRI, toxic multinodular goiter, anemia, COPD, asthma, sleep apnea on CPAP, HTN, GERD, hyperthyroidism, presents to [facility] with cough for about a week. She has some blood tingued sputum. There has been no shortness of breath or chest pain. No fever, chills, or diaphoresis. Evaluation in the ER included a CXR which showed multilobular infiltrate, but cannot exclude a pulmonary mass. Radiologist recommended at least f/up CXR after Tx for CAP, or CT chest with contrast. She does have CRI and her Cr is 1.7. Her WBC is 20K. She is stable, and hospitalist was asked to admit for multilobar PNA with abnormal CXR needing further work up.   HOSPITAL COURSE:  Assessment/Plan:  1. Multifocal pneumonia -clinically improving, afebrile and WBC trending down  -continue IV rocephin/zithromax Day 4 Concern about atypical infection/pulmonary embolism according to Dr. Alwyn Ren, from his note on 4/28  Doppler negative -influenza PCR negative  -urine legionella negative, pneumococcal Ag negative  -nebs for pulm toilet , Robitussin for cough will need repeat CT scan 1 month post abx completion to ensure no mass and clearing of PNA  -follow up with Pulmonary in office in one month  -no indication for bronch at this time.   2. Asthma:  -stable, not wheezing at this time  -albuterol PRN, singulair   3. DM: stable  - continue Lantus, SSI   4. HTN:  -continue amlodipine, bystolic , increased clonidine  DVT proph: heparin SQ  5.disposition-anticipate discharge tomorrow    HPI/Subjective:  Coughing is improved, would like to stay until tomorrow and complete antibiotics   Objective: Filed Vitals:   08/24/12 2017 08/24/12 2126 08/24/12 2154 08/25/12 0432  BP: 147/45   151/44  Pulse:  68  58  Temp: 98.7 F (37.1 C)   97.4 F (36.3 C)  TempSrc: Oral   Axillary  Resp: 16 18  18   Height:      Weight:   121.519 kg (267 lb 14.4 oz)   SpO2: 97% 95%  96%    Intake/Output Summary (Last 24 hours) at 08/25/12 0813 Last data filed at 08/24/12 1500  Gross per 24 hour  Intake    120 ml  Output      0 ml  Net    120 ml    Exam:  HENT:  Head: Atraumatic.  Nose: Nose normal.  Mouth/Throat: Oropharynx is clear and moist.  Eyes: Conjunctivae are normal. Pupils are equal, round, and reactive to light. No scleral icterus.  Neck: Neck supple. No tracheal deviation present.  Cardiovascular: Normal rate, regular rhythm, normal heart sounds and intact distal pulses.  Pulmonary/Chest: Effort normal and breath sounds normal. No respiratory distress.  Abdominal: Soft. Normal appearance and bowel sounds are normal. She exhibits no distension. There is no tenderness.  Musculoskeletal: She exhibits no edema and no tenderness.  Neurological: She is alert. No cranial nerve deficit.    Data Reviewed: Basic Metabolic Panel:  Recent Labs Lab 08/19/12 1705 08/19/12 2153 08/21/12 0704 08/22/12 0725  NA 137  --  134* 137  K 4.7  --  4.8 4.7  CL 101  --  102 103  CO2 24  --  20 23  GLUCOSE 80  --  229* 187*  BUN 30*  --  25* 25*  CREATININE 1.70* 1.62* 1.45* 1.43*  CALCIUM 9.6  --  9.5 9.8    Liver Function Tests: No results found for this basename: AST, ALT, ALKPHOS, BILITOT, PROT, ALBUMIN,  in the last 168 hours No results found for this basename: LIPASE, AMYLASE,  in the last 168 hours No results found for this basename: AMMONIA,  in the last 168 hours  CBC:  Recent Labs Lab 08/19/12 1705 08/19/12 2153 08/21/12 0704 08/22/12 0725 08/24/12 0515   WBC 20.6* 20.8* 15.0* 14.6* 13.4*  NEUTROABS 16.4*  --   --   --   --   HGB 12.4 12.2 11.1* 11.2* 11.0*  HCT 38.1 36.8 34.2* 34.2* 33.7*  MCV 92.3 88.5 88.8 87.9 88.2  PLT 271 273 275 305 315    Cardiac Enzymes:  Recent Labs Lab 08/19/12 1705  TROPONINI <0.30   BNP (last 3 results)  Recent Labs  07/18/12 1005 08/19/12 1705  PROBNP 198.0* 1833.0*     CBG:  Recent Labs Lab 08/24/12 0809 08/24/12 1135 08/24/12 1712 08/24/12 2155 08/25/12 0746  GLUCAP 178* 246* 125* 213* 161*    Recent Results (from the past 240 hour(s))  CULTURE, BLOOD (ROUTINE X 2)     Status: None   Collection Time    08/19/12  6:11 PM      Result Value Range Status   Specimen Description BLOOD RIGHT HAND   Final   Special Requests BOTTLES DRAWN AEROBIC AND ANAEROBIC 5CC EACH   Final   Culture  Setup Time 08/20/2012 01:33   Final   Culture     Final   Value:        BLOOD CULTURE RECEIVED NO GROWTH TO DATE CULTURE WILL BE HELD FOR 5 DAYS BEFORE ISSUING A FINAL NEGATIVE REPORT   Report Status PENDING   Incomplete  CULTURE, BLOOD (ROUTINE X 2)     Status: None   Collection Time    08/19/12  7:16 PM      Result Value Range Status   Specimen Description BLOOD LEFT ARM   Final   Special Requests BOTTLES DRAWN AEROBIC AND ANAEROBIC 5CC EACH   Final   Culture  Setup Time 08/20/2012 01:33   Final   Culture     Final   Value:        BLOOD CULTURE RECEIVED NO GROWTH TO DATE CULTURE WILL BE HELD FOR 5 DAYS BEFORE ISSUING A FINAL NEGATIVE REPORT   Report Status PENDING   Incomplete     Studies: Dg Chest 2 View  08/22/2012  *RADIOLOGY REPORT*  Clinical Data: Follow up pneumonia.  CHEST - 2 VIEW  Comparison: CT chest 08/20/2012.  Two-view chest x-ray 08/19/2012, 10/13/2011.  Findings: Cardiac silhouette enlarged but stable.  Pulmonary vascularity normal without evidence of pulmonary edema.  Dense airspace consolidation in the left upper lobe inferiorly, unchanged.  Improved aeration in the right lower  lobe.  No new pulmonary parenchymal abnormalities.  Stable small left pleural effusion. Degenerative changes and DISH involving the thoracic spine.  IMPRESSION: Stable dense left upper lobe pneumonia with slight improvement in the right lower lobe pneumonia since the CT 2 days ago.  No new abnormalities.   Original Report Authenticated By: Hulan Saas, M.D.    Dg Chest 2 View  08/19/2012  *RADIOLOGY REPORT*  Clinical Data: Cough, fever and shortness of breath for the past 2- 3 weeks.  CHEST - 2 VIEW  Comparison: Chest x-ray 10/13/2011.  Findings: There are multifocal areas of airspace disease in the lungs bilaterally, most confluent in the left upper lobe and in the superior segment of the right lower lobe.  Possible trace bilateral pleural effusions (left greater than right).  Mild diffuse interstitial prominence and peribronchial cuffing.  Mild engorgement of the pulmonary vasculature, without frank pulmonary edema.  Mild cardiomegaly. The patient is rotated to the right on today's exam, resulting in distortion of the mediastinal contours and reduced diagnostic sensitivity and specificity for mediastinal pathology.  Atherosclerosis in the thoracic aorta.  Left hilar fullness.  IMPRESSION: 1.  Multifocal areas of apparent airspace consolidation in the lungs bilaterally, concerning for multilobar pneumonia.  The appearance of the left upper lobe in particular is somewhat unusual and slightly mass-like in appearance.  Additionally, there is left hilar fullness.  The possibility of centrally obstructing neoplasm around the left hilum, or a left upper lobe neoplasm with left hilar adenopathy is difficult to entirely exclude.  Clinical correlation is recommended.  At the very least, a follow-up PA and lateral chest radiograph in 2-3 weeks after appropriate trial of antimicrobial therapy is strongly recommended.  If for some reason there is strong clinical concern for neoplasm at this time, further evaluation with  a contrast enhanced chest CT would provide additional diagnostic information. 2.  Mild cardiomegaly with pulmonary venous congestion, but no frank pulmonary edema. 3.  Atherosclerosis.   Original Report Authenticated By: Trudie Reed, M.D.    Ct Chest Wo Contrast  08/20/2012  *RADIOLOGY REPORT*  Clinical Data: Cough for 1 week.  CT CHEST WITHOUT CONTRAST  Technique:  Multidetector CT imaging of the chest was performed following the standard protocol without IV contrast.  Comparison: 08/19/2012 chest x-ray  Findings: Thyroid gland is diffusely heterogeneous with asymmetric enlargement of the right lobe.  This is been evaluated previously with multiple nuclear medicine studies performed and 2012.  No axillary lymphadenopathy.  There is mediastinal lymphadenopathy with a 11 mm short-axis right paratracheal lymph node and a 10 mm short-axis AP window lymph node.  10 mm short-axis subcarinal lymph node is evident.  The heart is mildly enlarged.  No pericardial effusion.  Tiny left pleural effusion noted.  Lung windows demonstrate focal airspace consolidation in the left upper lobe, following a segmental distribution.  There is focal airspace consolidation in the posterior right lower lobe with patchy areas of central airspace disease in the left lower lobe. Apparent small pleural plaque in the anterior right hemithorax contains calcification.  Bone windows reveal no worrisome lytic or sclerotic osseous lesions.  IMPRESSION: Multifocal airspace consolidation to suggest pneumonia although neoplasm could have this appearance.  Small left pleural effusion.   Original Report Authenticated By: Kennith Center, M.D.    Dg Swallowing Func-speech Pathology  08/24/2012  Breck Coons [place], CCC-SLP     08/24/2012  2:10 PM Objective Swallowing Evaluation: Modified Barium Swallowing Study   Patient Details  Name: KESHAWN SUNDBERG MRN: [id number] Date of Birth: 01/25/1946  Today's Date: 08/24/2012 Time: 1005-1025 SLP Time Calculation  (min): 20 min  Past Medical History:  Past Medical History  Diagnosis Date  . Diabetes mellitus      IDDM;Dr Everardo All  . Toxic multinodular goiter     Dr Everardo All  . OSA (obstructive sleep apnea)     CPAP;Dr Clint Young  . Glaucoma(365)   .  Anemia     iron deficinecy  . Asthma   . COPD (chronic obstructive pulmonary disease)   . Depression   . GERD (gastroesophageal reflux disease)   . Hyperlipidemia   . Hypertension   . Hyperthyroidism     in context of nodule; Tapazole therapy since 2007  . Skin cancer     basal cell  . Diverticulosis of colon 2009  . Urticaria, idiopathic   . Renal disease     insufficiency   -- saw Nephrologist  in [place]..hasn't  seen one in 2-3 yrs  . Arthritis   . Eczema   . DM (diabetes mellitus)   . Anxiety   . IBS (irritable bowel syndrome)    Past Surgical History:  Past Surgical History  Procedure Laterality Date  . Total abdominal hysterectomy w/ bilateral salpingoophorectomy       sister with ovarian cancer  . Replacement total knee Left 2003    left ; Dr Sullivan Lone; [place]  . Total knee arthroplasty  10/19/2011    Procedure: TOTAL KNEE ARTHROPLASTY;  Surgeon: Loreta Ave,  MD;  Location: [facility] OR;  Service: Orthopedics;  Laterality: Right;   DR MURPHY WANTS 90 MINUTES FOR THIS CASE   . Colonoscopy  2006    Next Due date 2016   HPI:   67 y.o. female with hx of DM, CRI, toxic multinodular goiter,  anemia, COPD, asthma, sleep apnea on CPAP, HTN, GERD,  hyperthyroidism, presents to [facility] with cough for about a week.  She has some blood tingued sputum. CXR which showed multilobular  infiltrate, but cannot exclude a pulmonary mass.  Pt. admitted  for multilobar PNA with abnormal CXR needing further work up.  MD  wants to rule out silent aspiration.  BSE deferred to [facility]     Assessment / Plan / Recommendation Clinical Impression  Dysphagia Diagnosis: Suspected primary esophageal dysphagia Clinical impression: Oral and pharyngeal phases of swallow were  within functional limits.   Esophageal scan revealed what appeared  to be residual in mid to distal esophagus (not diagnosed on an  MBS).  Pt. demonstrated strong cough 3-4 times during the study  without evidence of barium in laryngeal vestibule or trachea.   SLP suspects cough may be due to esophageal residue ("reflux  cough") possibly irritating the vagus nerve (?).  Pt. may benefit  from GI consult to further assess esophageal function.  SLP  recommends continuing regular texture diet and thin liquids.  SLP  provided therapeutic intervention of reflux precautions and  clinical rationale/explanation.  No further ST needed.    Treatment Recommendation  No treatment recommended at this time    Diet Recommendation Regular;Thin liquid   Liquid Administration via: Straw;Cup Medication Administration: Whole meds with liquid Supervision: Patient able to self feed Compensations: Slow rate;Small sips/bites Postural Changes and/or Swallow Maneuvers: Seated upright 90  degrees;Upright 30-60 min after meal    Other  Recommendations Recommended Consults: Consider GI  evaluation Oral Care Recommendations: Oral care BID   Follow Up Recommendations  None    Frequency and Duration        Pertinent Vitals/Pain     SLP Swallow Goals     General HPI:  67 y.o. female with hx of DM, CRI, toxic  multinodular goiter, anemia, COPD, asthma, sleep apnea on CPAP,  HTN, GERD, hyperthyroidism, presents to [facility] with cough for about  a week. She has some blood tingued sputum. CXR which showed  multilobular infiltrate, but cannot exclude a pulmonary  mass.   Pt. admitted for multilobar PNA with abnormal CXR needing further  work up.  MD wants to rule out silent aspiration.  BSE deferred  to MBS Type of Study: Modified Barium Swallowing Study Reason for Referral: Objectively evaluate swallowing function Previous Swallow Assessment: none Diet Prior to this Study: Regular;Thin liquids Temperature Spikes Noted: No Respiratory Status: Room air History of Recent Intubation: No  Behavior/Cognition: Pleasant mood;Cooperative;Alert Oral Cavity - Dentition: Adequate natural dentition Oral Motor / Sensory Function: Within functional limits Self-Feeding Abilities: Able to feed self Patient Positioning: Upright in chair Baseline Vocal Quality: Clear Volitional Cough: Strong Volitional Swallow: Able to elicit Pharyngeal Secretions: Not observed secondary MBS    Reason for Referral Objectively evaluate swallowing function   Oral Phase Oral Preparation/Oral Phase Oral Phase: WFL   Pharyngeal Phase Pharyngeal Phase Pharyngeal Phase: Within functional limits  Cervical Esophageal Phase    GO    Cervical Esophageal Phase Cervical Esophageal Phase: [facility]         Darrow Bussing.Ed CCC-SLP Pager [phone]  08/24/2012      Scheduled Meds: . amLODipine  5 mg Oral BID  . antiseptic oral rinse  15 mL Mouth Rinse BID  . aspirin EC  81 mg Oral Q supper  . azithromycin  500 mg Intravenous Q24H  . budesonide-formoterol  2 puff Inhalation BID  . cefTRIAXone (ROCEPHIN)  IV  1 g Intravenous Q24H  . cholecalciferol  1,000 Units Oral Daily  . cloNIDine  0.1 mg Oral TID  . docusate sodium  100 mg Oral BID  . famotidine  20 mg Oral BID  . guaiFENesin-codeine  10 mL Oral TID  . heparin  5,000 Units Subcutaneous Q8H  . insulin aspart  0-20 Units Subcutaneous TID WC  . insulin aspart  0-5 Units Subcutaneous QHS  . insulin aspart  6 Units Subcutaneous TID WC  . insulin glargine  55 Units Subcutaneous QHS  . levalbuterol  1.25 mg Nebulization TID  . loratadine  10 mg Oral Daily  . montelukast  10 mg Oral QHS  . nebivolol  15 mg Oral Daily  . saccharomyces boulardii  250 mg Oral Daily  . simvastatin  5 mg Oral q1800   Continuous Infusions: . sodium chloride 10 mL/hr at 08/21/12 1333    Active Problems:   HYPERLIPIDEMIA   DEPRESSION   HYPERTENSION   ASTHMA   Obstructive sleep apnea   Type 2 diabetes, uncontrolled, with renal manifestation   Morbid obesity   Renal insufficiency   CAP  (community acquired pneumonia)    Time spent: 40 minutes   [facility]  [facility] Pager [phone]. If 8PM-8AM, please contact night-coverage at [email or web address], password [facility] 08/25/2012, 8:13 AM  LOS: 6 days

## 2012-08-26 LAB — COMPREHENSIVE METABOLIC PANEL
AST: 16 U/L (ref 0–37)
Albumin: 2.9 g/dL — ABNORMAL LOW (ref 3.5–5.2)
Alkaline Phosphatase: 88 U/L (ref 39–117)
Chloride: 100 mEq/L (ref 96–112)
Potassium: 5 mEq/L (ref 3.5–5.1)
Sodium: 137 mEq/L (ref 135–145)
Total Bilirubin: 0.2 mg/dL — ABNORMAL LOW (ref 0.3–1.2)
Total Protein: 6.6 g/dL (ref 6.0–8.3)

## 2012-08-26 LAB — CULTURE, BLOOD (ROUTINE X 2)
Culture: NO GROWTH
Culture: NO GROWTH

## 2012-08-26 LAB — GLUCOSE, CAPILLARY: Glucose-Capillary: 174 mg/dL — ABNORMAL HIGH (ref 70–99)

## 2012-08-26 MED ORDER — GUAIFENESIN-CODEINE 100-10 MG/5ML PO SOLN: 10.0000 mL | Freq: Three times a day (TID) | ORAL | Status: DC

## 2012-08-26 MED ORDER — CLONIDINE HCL 0.1 MG PO TABS: 0.1000 mg | ORAL_TABLET | Freq: Three times a day (TID) | ORAL | Status: DC

## 2012-08-26 MED ORDER — CEFDINIR 300 MG PO CAPS: 300.0000 mg | ORAL_CAPSULE | Freq: Two times a day (BID) | ORAL | Status: AC

## 2012-08-26 MED ORDER — AZITHROMYCIN 500 MG PO TABS: 500.0000 mg | ORAL_TABLET | Freq: Every day | ORAL | Status: AC

## 2012-08-26 NOTE — Discharge Summary (Signed)
Physician Discharge Summary  Valerie Stevenson MRN: [id number] DOB/AGE: [birth year] 67 y.o.  PCP: Marga Melnick, MD   Admit date: 08/19/2012 Discharge date: 08/26/2012  Discharge Diagnoses:      HYPERLIPIDEMIA   DEPRESSION   HYPERTENSION   ASTHMA   Obstructive sleep apnea   Type 2 diabetes, uncontrolled, with renal manifestation   Morbid obesity   Renal insufficiency   CAP (community acquired pneumonia)     Medication List    TAKE these medications       albuterol 108 (90 BASE) MCG/ACT inhaler  Commonly known as:  PROVENTIL HFA;VENTOLIN HFA  Inhale 2 puffs into the lungs every 4 (four) hours as needed for wheezing.     amLODipine 5 MG tablet  Commonly known as:  NORVASC  Take 5 mg by mouth 2 (two) times daily.     aspirin EC 81 MG tablet  Take 81 mg by mouth daily with supper.     azithromycin 500 MG tablet  Commonly known as:  ZITHROMAX  Take 1 tablet (500 mg total) by mouth daily.     azithromycin 500 MG tablet  Commonly known as:  ZITHROMAX  Take 1 tablet (500 mg total) by mouth daily.     B-D UF III MINI PEN NEEDLES 31G X 5 MM Misc  Generic drug:  Insulin Pen Needle  As directed     Insulin Pen Needle 31G X 8 MM Misc  Commonly known as:  RELION PEN NEEDLE 31G/8MM  USE AS DIRECTED THREE TIMES DAILY     budesonide-formoterol 80-4.5 MCG/ACT inhaler  Commonly known as:  SYMBICORT  Inhale 2 puffs into the lungs 2 (two) times daily.     cefdinir 300 MG capsule  Commonly known as:  OMNICEF  Take 1 capsule (300 mg total) by mouth 2 (two) times daily.     cetirizine 10 MG tablet  Commonly known as:  ZYRTEC  Take 10 mg by mouth daily with supper.     cloNIDine 0.1 MG tablet  Commonly known as:  CATAPRES  Take 1 tablet (0.1 mg total) by mouth 2 (two) times daily.     cloNIDine 0.1 MG tablet  Commonly known as:  CATAPRES  Take 1 tablet (0.1 mg total) by mouth 3 (three) times daily.     EPINEPHrine 0.3 mg/0.3 mL Devi  Commonly known as:  EPIPEN  2-PAK  Inject 0.3 mLs (0.3 mg total) into the muscle once.     fexofenadine 180 MG tablet  Commonly known as:  ALLEGRA  Take 180 mg by mouth daily.     furosemide 40 MG tablet  Commonly known as:  LASIX  Take 1 tablet (40 mg total) by mouth daily as needed. For swelling     guaiFENesin 600 MG 12 hr tablet  Commonly known as:  MUCINEX  Take 1 tablet (600 mg total) by mouth 2 (two) times daily.     guaiFENesin-codeine 100-10 MG/5ML syrup  Take 10 mLs by mouth 3 (three) times daily.     hydrOXYzine 10 MG tablet  Commonly known as:  ATARAX/VISTARIL  Take 1 tablet (10 mg total) by mouth every 4 (four) hours as needed for itching.     insulin glargine 100 UNIT/ML injection  Commonly known as:  LANTUS  Inject 0.5 mLs (50 Units total) into the skin at bedtime.     Insulin Syringe-Needle U-100 31G X 5/16" 1 ML Misc  Commonly known as:  RELION INSULIN SYRINGE 1ML/31G  USE AS  DIRECTED ONCE DAILY.     levofloxacin 500 MG tablet  Commonly known as:  LEVAQUIN  Take 1 tablet (500 mg total) by mouth daily.     LORazepam 0.5 MG tablet  Commonly known as:  ATIVAN  Take 0.5 mg by mouth at bedtime as needed for anxiety. For sleep     montelukast 10 MG tablet  Commonly known as:  SINGULAIR  Take 1 tablet (10 mg total) by mouth at bedtime.     nebivolol 5 MG tablet  Commonly known as:  BYSTOLIC  Take 3 tablets (15 mg total) by mouth daily.     NOVOLOG FLEXPEN 100 UNIT/ML injection  Generic drug:  insulin aspart  Inject 20-35 Units into the skin See admin instructions. 30 units in the am, 20 units midday and 35 units in the pm     pravastatin 40 MG tablet  Commonly known as:  PRAVACHOL  Take 40 mg by mouth at bedtime.     PROBIOTIC PO  Take 1 tablet by mouth daily.     ranitidine 150 MG tablet  Commonly known as:  ZANTAC  Take 150 mg by mouth 2 (two) times daily.     RELION ULTRA THIN PLUS LANCETS Misc  As directed     saccharomyces boulardii 250 MG capsule  Commonly known  as:  FLORASTOR  Take 1 capsule (250 mg total) by mouth daily.     Vitamin D3 2000 UNITS capsule  Take 4,000 Units by mouth daily.        Discharge Condition: stable   Disposition: home   Consults: pulmonary   Significant Diagnostic Studies: Dg Chest 2 View  08/22/2012  *RADIOLOGY REPORT*  Clinical Data: Follow up pneumonia.  CHEST - 2 VIEW  Comparison: CT chest 08/20/2012.  Two-view chest x-ray 08/19/2012, 10/13/2011.  Findings: Cardiac silhouette enlarged but stable.  Pulmonary vascularity normal without evidence of pulmonary edema.  Dense airspace consolidation in the left upper lobe inferiorly, unchanged.  Improved aeration in the right lower lobe.  No new pulmonary parenchymal abnormalities.  Stable small left pleural effusion. Degenerative changes and DISH involving the thoracic spine.  IMPRESSION: Stable dense left upper lobe pneumonia with slight improvement in the right lower lobe pneumonia since the CT 2 days ago.  No new abnormalities.   Original Report Authenticated By: Hulan Saas, M.D.    Dg Chest 2 View  08/19/2012  *RADIOLOGY REPORT*  Clinical Data: Cough, fever and shortness of breath for the past 2- 3 weeks.  CHEST - 2 VIEW  Comparison: Chest x-ray 10/13/2011.  Findings: There are multifocal areas of airspace disease in the lungs bilaterally, most confluent in the left upper lobe and in the superior segment of the right lower lobe.  Possible trace bilateral pleural effusions (left greater than right).  Mild diffuse interstitial prominence and peribronchial cuffing.  Mild engorgement of the pulmonary vasculature, without frank pulmonary edema.  Mild cardiomegaly. The patient is rotated to the right on today's exam, resulting in distortion of the mediastinal contours and reduced diagnostic sensitivity and specificity for mediastinal pathology.  Atherosclerosis in the thoracic aorta.  Left hilar fullness.  IMPRESSION: 1.  Multifocal areas of apparent airspace consolidation in  the lungs bilaterally, concerning for multilobar pneumonia.  The appearance of the left upper lobe in particular is somewhat unusual and slightly mass-like in appearance.  Additionally, there is left hilar fullness.  The possibility of centrally obstructing neoplasm around the left hilum, or a left upper lobe neoplasm with left  hilar adenopathy is difficult to entirely exclude.  Clinical correlation is recommended.  At the very least, a follow-up PA and lateral chest radiograph in 2-3 weeks after appropriate trial of antimicrobial therapy is strongly recommended.  If for some reason there is strong clinical concern for neoplasm at this time, further evaluation with a contrast enhanced chest CT would provide additional diagnostic information. 2.  Mild cardiomegaly with pulmonary venous congestion, but no frank pulmonary edema. 3.  Atherosclerosis.   Original Report Authenticated By: Trudie Reed, M.D.    Ct Chest Wo Contrast  08/20/2012  *RADIOLOGY REPORT*  Clinical Data: Cough for 1 week.  CT CHEST WITHOUT CONTRAST  Technique:  Multidetector CT imaging of the chest was performed following the standard protocol without IV contrast.  Comparison: 08/19/2012 chest x-ray  Findings: Thyroid gland is diffusely heterogeneous with asymmetric enlargement of the right lobe.  This is been evaluated previously with multiple nuclear medicine studies performed and 2012.  No axillary lymphadenopathy.  There is mediastinal lymphadenopathy with a 11 mm short-axis right paratracheal lymph node and a 10 mm short-axis AP window lymph node.  10 mm short-axis subcarinal lymph node is evident.  The heart is mildly enlarged.  No pericardial effusion.  Tiny left pleural effusion noted.  Lung windows demonstrate focal airspace consolidation in the left upper lobe, following a segmental distribution.  There is focal airspace consolidation in the posterior right lower lobe with patchy areas of central airspace disease in the left lower  lobe. Apparent small pleural plaque in the anterior right hemithorax contains calcification.  Bone windows reveal no worrisome lytic or sclerotic osseous lesions.  IMPRESSION: Multifocal airspace consolidation to suggest pneumonia although neoplasm could have this appearance.  Small left pleural effusion.   Original Report Authenticated By: Kennith Center, M.D.    Dg Swallowing Func-speech Pathology  08/24/2012  Breck Coons Girard, CCC-SLP     08/24/2012  2:10 PM Objective Swallowing Evaluation: Modified Barium Swallowing Study   Patient Details  Name: Valerie Stevenson MRN: [id number] Date of Birth: 13-Feb-1946  Today's Date: 08/24/2012 Time: 1005-1025 SLP Time Calculation (min): 20 min  Past Medical History:  Past Medical History  Diagnosis Date  . Diabetes mellitus      IDDM;Dr Everardo All  . Toxic multinodular goiter     Dr Everardo All  . OSA (obstructive sleep apnea)     CPAP;Dr Clint Young  . Glaucoma(365)   . Anemia     iron deficinecy  . Asthma   . COPD (chronic obstructive pulmonary disease)   . Depression   . GERD (gastroesophageal reflux disease)   . Hyperlipidemia   . Hypertension   . Hyperthyroidism     in context of nodule; Tapazole therapy since 2007  . Skin cancer     basal cell  . Diverticulosis of colon 2009  . Urticaria, idiopathic   . Renal disease     insufficiency   -- saw Nephrologist  in [place]..hasn't  seen one in 2-3 yrs  . Arthritis   . Eczema   . DM (diabetes mellitus)   . Anxiety   . IBS (irritable bowel syndrome)    Past Surgical History:  Past Surgical History  Procedure Laterality Date  . Total abdominal hysterectomy w/ bilateral salpingoophorectomy       sister with ovarian cancer  . Replacement total knee Left 2003    left ; Dr Sullivan Lone; [place]  . Total knee arthroplasty  10/19/2011    Procedure: TOTAL KNEE ARTHROPLASTY;  Surgeon: Reuel Boom  Georg Ruddle,  MD;  Location: [facility] OR;  Service: Orthopedics;  Laterality: Right;   DR MURPHY WANTS 90 MINUTES FOR THIS CASE   . Colonoscopy  2006    Next Due  date 2016   HPI:   67 y.o. female with hx of DM, CRI, toxic multinodular goiter,  anemia, COPD, asthma, sleep apnea on CPAP, HTN, GERD,  hyperthyroidism, presents to [facility] with cough for about a week.  She has some blood tingued sputum. CXR which showed multilobular  infiltrate, but cannot exclude a pulmonary mass.  Pt. admitted  for multilobar PNA with abnormal CXR needing further work up.  MD  wants to rule out silent aspiration.  BSE deferred to [facility]     Assessment / Plan / Recommendation Clinical Impression  Dysphagia Diagnosis: Suspected primary esophageal dysphagia Clinical impression: Oral and pharyngeal phases of swallow were  within functional limits.  Esophageal scan revealed what appeared  to be residual in mid to distal esophagus (not diagnosed on an  MBS).  Pt. demonstrated strong cough 3-4 times during the study  without evidence of barium in laryngeal vestibule or trachea.   SLP suspects cough may be due to esophageal residue ("reflux  cough") possibly irritating the vagus nerve (?).  Pt. may benefit  from GI consult to further assess esophageal function.  SLP  recommends continuing regular texture diet and thin liquids.  SLP  provided therapeutic intervention of reflux precautions and  clinical rationale/explanation.  No further ST needed.    Treatment Recommendation  No treatment recommended at this time    Diet Recommendation Regular;Thin liquid   Liquid Administration via: Straw;Cup Medication Administration: Whole meds with liquid Supervision: Patient able to self feed Compensations: Slow rate;Small sips/bites Postural Changes and/or Swallow Maneuvers: Seated upright 90  degrees;Upright 30-60 min after meal    Other  Recommendations Recommended Consults: Consider GI  evaluation Oral Care Recommendations: Oral care BID   Follow Up Recommendations  None    Frequency and Duration        Pertinent Vitals/Pain     SLP Swallow Goals     General HPI:  67 y.o. female with hx of DM, CRI, toxic  multinodular  goiter, anemia, COPD, asthma, sleep apnea on CPAP,  HTN, GERD, hyperthyroidism, presents to [facility] with cough for about  a week. She has some blood tingued sputum. CXR which showed  multilobular infiltrate, but cannot exclude a pulmonary mass.   Pt. admitted for multilobar PNA with abnormal CXR needing further  work up.  MD wants to rule out silent aspiration.  BSE deferred  to MBS Type of Study: Modified Barium Swallowing Study Reason for Referral: Objectively evaluate swallowing function Previous Swallow Assessment: none Diet Prior to this Study: Regular;Thin liquids Temperature Spikes Noted: No Respiratory Status: Room air History of Recent Intubation: No Behavior/Cognition: Pleasant mood;Cooperative;Alert Oral Cavity - Dentition: Adequate natural dentition Oral Motor / Sensory Function: Within functional limits Self-Feeding Abilities: Able to feed self Patient Positioning: Upright in chair Baseline Vocal Quality: Clear Volitional Cough: Strong Volitional Swallow: Able to elicit Pharyngeal Secretions: Not observed secondary MBS    Reason for Referral Objectively evaluate swallowing function   Oral Phase Oral Preparation/Oral Phase Oral Phase: WFL   Pharyngeal Phase Pharyngeal Phase Pharyngeal Phase: Within functional limits  Cervical Esophageal Phase    GO    Cervical Esophageal Phase Cervical Esophageal Phase: [facility]         Darrow Bussing.Ed [employer] [phone]  08/24/2012  Microbiology: Recent Results (from the past 240 hour(s))  CULTURE, BLOOD (ROUTINE X 2)     Status: None   Collection Time    08/19/12  6:11 PM      Result Value Range Status   Specimen Description BLOOD RIGHT HAND   Final   Special Requests BOTTLES DRAWN AEROBIC AND ANAEROBIC 5CC EACH   Final   Culture  Setup Time 08/20/2012 01:33   Final   Culture     Final   Value:        BLOOD CULTURE RECEIVED NO GROWTH TO DATE CULTURE WILL BE HELD FOR 5 DAYS BEFORE ISSUING A FINAL NEGATIVE REPORT   Report Status PENDING    Incomplete  CULTURE, BLOOD (ROUTINE X 2)     Status: None   Collection Time    08/19/12  7:16 PM      Result Value Range Status   Specimen Description BLOOD LEFT ARM   Final   Special Requests BOTTLES DRAWN AEROBIC AND ANAEROBIC 5CC EACH   Final   Culture  Setup Time 08/20/2012 01:33   Final   Culture     Final   Value:        BLOOD CULTURE RECEIVED NO GROWTH TO DATE CULTURE WILL BE HELD FOR 5 DAYS BEFORE ISSUING A FINAL NEGATIVE REPORT   Report Status PENDING   Incomplete     Labs: Results for orders placed during the hospital encounter of 08/19/12 (from the past 48 hour(s))  GLUCOSE, CAPILLARY     Status: Abnormal   Collection Time    08/24/12 11:35 AM      Result Value Range   Glucose-Capillary 246 (*) 70 - 99 mg/dL  GLUCOSE, CAPILLARY     Status: Abnormal   Collection Time    08/24/12  5:12 PM      Result Value Range   Glucose-Capillary 125 (*) 70 - 99 mg/dL  GLUCOSE, CAPILLARY     Status: Abnormal   Collection Time    08/24/12  9:55 PM      Result Value Range   Glucose-Capillary 213 (*) 70 - 99 mg/dL  GLUCOSE, CAPILLARY     Status: Abnormal   Collection Time    08/25/12  7:46 AM      Result Value Range   Glucose-Capillary 161 (*) 70 - 99 mg/dL  GLUCOSE, CAPILLARY     Status: Abnormal   Collection Time    08/25/12 12:25 PM      Result Value Range   Glucose-Capillary 210 (*) 70 - 99 mg/dL  GLUCOSE, CAPILLARY     Status: Abnormal   Collection Time    08/25/12  5:11 PM      Result Value Range   Glucose-Capillary 244 (*) 70 - 99 mg/dL  GLUCOSE, CAPILLARY     Status: Abnormal   Collection Time    08/25/12 10:15 PM      Result Value Range   Glucose-Capillary 152 (*) 70 - 99 mg/dL   Comment 1 Documented in Chart     Comment 2 Notify RN    COMPREHENSIVE METABOLIC PANEL     Status: Abnormal   Collection Time    08/26/12  5:15 AM      Result Value Range   Sodium 137  135 - 145 mEq/L   Potassium 5.0  3.5 - 5.1 mEq/L   Chloride 100  96 - 112 mEq/L   CO2 26  19 - 32  mEq/L   Glucose, Bld 217 (*)  70 - 99 mg/dL   BUN 29 (*) 6 - 23 mg/dL   Creatinine, Ser 0.96 (*) 0.50 - 1.10 mg/dL   Calcium 04.5  8.4 - 40.9 mg/dL   Total Protein 6.6  6.0 - 8.3 g/dL   Albumin 2.9 (*) 3.5 - 5.2 g/dL   AST 16  0 - 37 U/L   ALT 9  0 - 35 U/L   Alkaline Phosphatase 88  39 - 117 U/L   Total Bilirubin 0.2 (*) 0.3 - 1.2 mg/dL   GFR calc non Af Amer 38 (*) >90 mL/min   GFR calc Af Amer 44 (*) >90 mL/min   Comment:            The eGFR has been calculated     using the CKD EPI equation.     This calculation has not been     validated in all clinical     situations.     eGFR's persistently     <90 mL/min signify     possible Chronic Kidney Disease.  GLUCOSE, CAPILLARY     Status: Abnormal   Collection Time    08/26/12  8:00 AM      Result Value Range   Glucose-Capillary 174 (*) 70 - 99 mg/dL     HPI :67 y/o WF admitted on 4/27 with cough, chills / sweats for 2-3 wks. Initial CXR with LUL, RLL infiltrate. Further evaluated with CT w/o contrast demonstrated significant infiltrate LUL, RLL not excluding mass. PCCM consulted for workup.    HOSPITAL COURSE: * HOSPITAL COURSE:  Assessment/Plan:  1. Multifocal pneumonia -clinically improving, afebrile and WBC trending down  rx with  IV rocephin/zithromax Day 6, now switched to omnicef and azithromycin for 5 more days    Doppler negative -influenza PCR negative  -urine legionella negative, pneumococcal Ag negative  -nebs for pulm toilet , Robitussin for cough  Per pulmonary will need repeat CT scan 1 month post abx completion to ensure no mass and clearing of PNA  -follow up with Pulmonary in office in one month  -no indication for bronch at this time.   2. Asthma:  -stable, not wheezing at this time  -albuterol PRN, singulair   3. DM: stable  - continue Lantus, SSI    4. HTN:  -continue amlodipine, bystolic , increased clonidine to TID         Discharge Exam:    Blood pressure 132/55, pulse 53,  temperature 98.1 F (36.7 C), temperature source Oral, resp. rate 18, height 5\' 5"  (1.651 m), weight 121.655 kg (268 lb 3.2 oz), SpO2 96.00%.  General: Morbidly obese female in NAD  Neuro: AAOx4, MAE  HEENT: Mm pink/moist, short thick neck  Cardiovascular: s1s2 rrr, no appreciable m/r/g  Lungs: resp's even/non-labored on RA, lungs bilaterally diminished but clear  Abdomen: Obese/soft, bsx4 active  Musculoskeletal: No acute deformities  Skin: Warm/dry, no edema, patchy eczema on BLE            Future Appointments Provider Department Dept Phone   09/26/2012 10:00 AM [facility] [address] [phone]   Patient to arrive 15 minutes prior to appointment time. No solid food 4 hours prior to exam. Liquids and Medicines are okay.   10/03/2012 11:00 AM Waymon Budge, MD [facility] [phone]   10/19/2012 8:15 AM Romero Belling, MD [facility] [phone]   06/21/2013 10:00 AM Waymon Budge, MD [facility] [phone]  Follow-up Information   Follow up with Marga Melnick, MD. Schedule an appointment as soon as possible for a visit in 1 week.   Contact information:   [address] [facility] [address] [place] [address] [phone]       Follow up with Waymon Budge, MD On 10/03/2012. (Appt at 10:45)    Contact information:   [address]  [facility] [place] [address] [phone]       Follow up with CT CHEST  On 09/26/2012. (see instructions below)       Signed: Richarda Overlie 08/26/2012, 10:54 AM

## 2012-08-26 NOTE — Progress Notes (Signed)
Patient currently has left forearm NSL dated 08/19/2012.  It is out of date.  Patient has refused in past for restart.  She is agreeable to IV team attempting restart 08/26/2012 if she does not go home.  Frisco Cordts RN, Justine Null

## 2012-08-26 NOTE — Progress Notes (Signed)
Patient discharged home. Patient discharged home with discharge instructions and prescriptions. Patient complained of itching, possible yeast infection before discharge. Dr Susie Cassette was notified and a prescription was sent to patients pharmacy. Patient was notified of appointment.s Patient was told to call MD with questions and concerns. Patient was stable upon discharge.

## 2012-08-27 ENCOUNTER — Telehealth: Payer: Self-pay | Admitting: Internal Medicine

## 2012-08-27 ENCOUNTER — Other Ambulatory Visit: Payer: Self-pay | Admitting: Obstetrics and Gynecology

## 2012-08-27 DIAGNOSIS — R928 Other abnormal and inconclusive findings on diagnostic imaging of breast: Secondary | ICD-10-CM

## 2012-08-27 NOTE — Telephone Encounter (Signed)
appt changed to 09/24/12@10 :30am chest ct and HFU with dr young 09/25/12@11 :00am pt is aware of both appt Valerie Stevenson

## 2012-08-30 ENCOUNTER — Other Ambulatory Visit: Payer: Self-pay | Admitting: Internal Medicine

## 2012-08-30 MED ORDER — MONTELUKAST SODIUM 10 MG PO TABS: 10.0000 mg | ORAL_TABLET | Freq: Every day | ORAL | Status: DC

## 2012-08-31 ENCOUNTER — Ambulatory Visit (INDEPENDENT_AMBULATORY_CARE_PROVIDER_SITE_OTHER): Payer: Medicare Other | Admitting: Internal Medicine

## 2012-08-31 ENCOUNTER — Encounter: Payer: Self-pay | Admitting: Internal Medicine

## 2012-08-31 ENCOUNTER — Ambulatory Visit (HOSPITAL_BASED_OUTPATIENT_CLINIC_OR_DEPARTMENT_OTHER)
Admission: RE | Admit: 2012-08-31 | Discharge: 2012-08-31 | Disposition: A | Discharge status: Home / Self Care | Payer: Medicare Other | Source: Ambulatory Visit | Attending: Internal Medicine | Admitting: Internal Medicine

## 2012-08-31 VITALS — BP 130/70 | HR 59 | Temp 98.2°F | Wt 265.0 lb

## 2012-08-31 DIAGNOSIS — I517 Cardiomegaly: Secondary | ICD-10-CM

## 2012-08-31 DIAGNOSIS — I10 Essential (primary) hypertension: Secondary | ICD-10-CM

## 2012-08-31 DIAGNOSIS — J189 Pneumonia, unspecified organism: Secondary | ICD-10-CM

## 2012-08-31 MED ORDER — RANITIDINE HCL 150 MG PO TABS: 150.0000 mg | ORAL_TABLET | Freq: Two times a day (BID) | ORAL | Status: DC

## 2012-08-31 NOTE — Progress Notes (Signed)
  Subjective:    Patient ID: Valerie Stevenson, female    DOB: 10/05/1945, 67 y.o.   MRN: [id number]  HPI Since discharge she has not had fever, chills, or sweats. She denies any pleuritic pain, dyspnea,  or purulent sputum. She has minimal wheezing; she is not requiring use of rescue inhaler. She does have the maintenance Symbicort which she's been using every other day. She has completed her Zithromax and has 1 Omnicef left.  The actual chest x-rays and CT scan were reviewed; these exhibit multiple areas of infiltration/consolidation. Most striking was the left upper lobe area. There was a consolidation in the right middle lobe which improved significantly. Significant cardiomegaly was suggested. By history she had an echocardiogram in the remote past. She is unsure why this was performed    Review of Systems   Her blood pressure cuff provides readings in the 150-160s over 50s. On direct check it has not appeared to be reliable. She is not having edema or paroxysmal nocturnal dyspnea.     Objective:   Physical Exam Appears fatigued but well-nourished & in no acute distress  No icterus  No carotid bruits are present.No neck pain distention present at 10 - 15 degrees. Thyroid normal to palpation  Heart rhythm and rate are normal with no significant murmurs or gallops.  Chest is clear with no increased work of breathing; no wheezing , rales or rhonchi  There is no evidence of aortic aneurysm or renal artery bruits  Abdomen soft with no organomegaly or masses. No HJR  No clubbing, cyanosis or edema present. Homan's negative  Pedal pulses are intact   No ischemic skin changes are present .   Alert and oriented. Strength, tone, DTRs reflexes normal          Assessment & Plan:  #1 atypical community acquired pneumonia in the context of high dose steroid administration for urticaria  #2 cardiomegaly  #3 hypertension, controlled  Plan: Incentive spirometry is recommended to  improve  ventilation and prevent atelectasis. Chest x-ray will be repeated. Echocardiogram will be performed to assess the cardiomegaly.  She has a followup CT scan without contrast scheduled 5/29. This is most appropriate in view of the atypical presentation.

## 2012-08-31 NOTE — Patient Instructions (Addendum)
Order for x-rays entered into  the computer; these will be performed at [facility]. No appointment is necessary.   Albuterol is a rescue inhaler which should be used as infrequently as possible; it should never be used more than 1-2 puffs every 4 hours . If it is required more than 2-3 times per week; the asthma is not well controlled. Symbicort is a maintenance agent used to control smooth muscle spasm and airways as well as the inflammatory component of asthma.

## 2012-09-03 ENCOUNTER — Telehealth: Payer: Self-pay | Admitting: Internal Medicine

## 2012-09-03 NOTE — Telephone Encounter (Signed)
[facility]. I called and made pt aware. She voiced her understanding and needed nothing further

## 2012-09-04 ENCOUNTER — Ambulatory Visit
Admission: RE | Admit: 2012-09-04 | Discharge: 2012-09-04 | Disposition: A | Discharge status: Home / Self Care | Payer: Medicare Other | Source: Ambulatory Visit | Attending: Obstetrics and Gynecology | Admitting: Obstetrics and Gynecology

## 2012-09-04 DIAGNOSIS — R928 Other abnormal and inconclusive findings on diagnostic imaging of breast: Secondary | ICD-10-CM

## 2012-09-10 ENCOUNTER — Ambulatory Visit (HOSPITAL_COMMUNITY): Payer: Medicare Other | Attending: Cardiology

## 2012-09-10 DIAGNOSIS — I10 Essential (primary) hypertension: Secondary | ICD-10-CM

## 2012-09-10 DIAGNOSIS — I079 Rheumatic tricuspid valve disease, unspecified: Secondary | ICD-10-CM | POA: Insufficient documentation

## 2012-09-10 DIAGNOSIS — I517 Cardiomegaly: Secondary | ICD-10-CM | POA: Insufficient documentation

## 2012-09-10 NOTE — Progress Notes (Signed)
Echocardiogram performed.  

## 2012-09-20 ENCOUNTER — Ambulatory Visit (INDEPENDENT_AMBULATORY_CARE_PROVIDER_SITE_OTHER)
Admission: RE | Admit: 2012-09-20 | Discharge: 2012-09-20 | Disposition: A | Discharge status: Home / Self Care | Payer: Medicare Other | Source: Ambulatory Visit | Attending: Pulmonary Disease | Admitting: Pulmonary Disease

## 2012-09-20 DIAGNOSIS — R918 Other nonspecific abnormal finding of lung field: Secondary | ICD-10-CM

## 2012-09-24 ENCOUNTER — Other Ambulatory Visit: Payer: Medicare Other

## 2012-09-25 ENCOUNTER — Encounter: Payer: Self-pay | Admitting: Internal Medicine

## 2012-09-25 ENCOUNTER — Ambulatory Visit (INDEPENDENT_AMBULATORY_CARE_PROVIDER_SITE_OTHER): Payer: Medicare Other | Admitting: Internal Medicine

## 2012-09-25 VITALS — BP 150/80 | HR 77 | Ht 65.0 in | Wt 272.0 lb

## 2012-09-25 DIAGNOSIS — J189 Pneumonia, unspecified organism: Secondary | ICD-10-CM

## 2012-09-25 DIAGNOSIS — L501 Idiopathic urticaria: Secondary | ICD-10-CM

## 2012-09-25 DIAGNOSIS — R911 Solitary pulmonary nodule: Secondary | ICD-10-CM

## 2012-09-25 DIAGNOSIS — G4733 Obstructive sleep apnea (adult) (pediatric): Secondary | ICD-10-CM

## 2012-09-25 NOTE — Assessment & Plan Note (Signed)
Good compliance and control. Weight loss not happening

## 2012-09-25 NOTE — Patient Instructions (Addendum)
Try dropping Zyrtec now. If no more hives in a week or two try dropping the Singulair. In another week or two, if still stable, you can try dropping the [facility] to try dropping the Vitamin D3 now to 2000 units. After a couple of weeks, you can try stopping it.  I will see you in 3 months to follow-up and at that visit we will schedule a CT scan for December  Please call as needed

## 2012-09-25 NOTE — Assessment & Plan Note (Signed)
Images reviewed w/ her. Second hand smoke exposure growing up.  We agreed to f/u CT at 6  Months=Dec, 2014.

## 2012-09-25 NOTE — Progress Notes (Signed)
Patient ID: [facility], female    DOB: 1945/05/27, 67 y.o.   MRN: [id number]  HPI 09/02/10- 54 yoF w/ hx urticaria complicated by asthma/ COPD, food allergy to shellfish, RAI for thyroid Last here 08/17/10 for initial visit for Dr [facility], with onset of hives 3 weeks previously. Discussed chronic lisinopril. Better with hives but still getting some especially as she gets up in the morning- shower makes them more visible. She has been taking Allegra 180, with Zantac 300mg  bid. She remains itchy. Had tried doxepin 10- no help and didn't like it. Dr [facility] gave Ativan.  Allergy profile-  IgE 296, but no specific elevations, WBC 16,600; No eos. No overt infection.  12/15/10- 35 yoF w/ hx urticaria complicated by asthma/ COPD, food allergy to shellfish, RAI for thyroid Continues to complain of itching with visible hives every day. Dr [facility] felt her thyroid hormone levels were not the problem, but the underlying cause of her thyroid disease may be related. She has tried zantac now at 150 twice daily, with Singulair. She came off allegra, which wasn't helping. Doxepin didn't help and not well tolerated. She has used Ativan to reduce irritability but it makes her too sleepy. I explained that urticaria may not be allergic, may be related to any of her medications, and may be related to her thyroid or diabetes disorders. There has not been evidence of malignancy. CT chest was negative except for thyroid nodule. Sedimentation rate was elevated at 57. ANA was negative. IgE was elevated at 266.1 but food profile was negative. Specific seafood profile showed mild elevations for crab and shrimp. IgG food profile results are being sought.  12/29/20-64 yoF w/ hx urticaria complicated by asthma/ COPD, food allergy to shellfish, RAI for thyroid Here today for new problem- concern of sleep disorder Documented hx OSA on CPAP. Needs to establish here for update Rx for machine and supplies. Her current CPAP at 10 is used  all night, every night. She had a sleep study in [facility] to establish the diagnosis. Her current machine is 80 or 68 years old and needs to be replaced. She has not been using a humidifier. Nasal pillows mask. She is satisfied with her treatment and only asks that we help get her replacement machine and maintain followup through this office. Sleep hygiene: Bedtime between 9 and 10 PM, sleep latency 10-15 minutes, waking 2 or 3 times each night for finally up between 7:30 and 8 AM. Weight has gone up 25 pounds in the last 2 years. She feels restless at night and will get up to sit on the side of the bed. This is mostly do to her ongoing urticaria. No history of ENT surgery.  Problem of urticaria is ongoing. She is pending allergy evaluation at [facility]. I suspect relationship to her thyroid disease. Other medical problems include kidney disease elevated cholesterol diabetes palpitations and hypertension. There is no history of heart or lung disease otherwise. Lab- IgG food allergy panel showed elevations for milk and egg white. She understands this information is not firmly validated, and is of value only to raise awareness. She will try to avoid these for a month and see if that affects her urticaria. She does still believe that some food is the basis for her hives, although we have discussed other etiologies.   06/21/11- 60 yoF w/ hx urticaria complicated by asthma/ COPD, food allergy to shellfish, RAI for thyroid, OSA/ CPAP Likes her new CPAP machine which is smaller.  Wears it all night every night and can't sleep without it. Using nasal pillows mask/Advanced/10 cwp. Has not had urticaria in the past month, off meds.. I had [facility] to [facility] where evaluation there put her on similar medication-Allegra, Singulair, Zantac. He told her basically the same things that I have told her.  06/20/12-  66 yoF w/ hx urticaria complicated by asthma/ COPD, food allergy to shellfish, RAI for thyroid, OSA/  CPAP FOLLOWS FOR: wears CPAP every night for about 8 hours; has not worn in past 2 nights as she has stayed at the [facility] with her sister in law due to surgery; would also like to know what could be causing her hives/itching--questionable reaction to new medicine (coreg) Little sleep for last 2 nights, helping family after knee surgery. Has had to miss CPAP 10/ Advanced for this.Very tired. Hives/ itching flared again after restarted on Coreg 3-4 weeks ago, which seemed to trigger before. We and [facility] had not found specific trigger 2 years ago. Dermatologist told her she has eczema- take Aveeno.   09/25/12-  30 yoF never smoker w/ hx urticaria complicated by asthma/ COPD/ abnl CT, food allergy to shellfish, RAI for thyroid, OSA/ CPAP, DM,  [facility] per [facility] CT results with patient as well. Acute Hosp 4/27-08/26/12 for CAP, multiloba, after 2 weeks malaise. Now back to baseline. Less SOB, no cough or phlegm, off ABX. No more urticaria, but has continued allegra/ zyrtec/ singulair. We discussed weaning off. CPAP 10/ Advanced remains good, al night, every night. CT chest in hosp abnormal- reviewed with her today. Denies any prior hx chest wall injury, pneumonia or asbestos exposure or any aspiration or syncope to explain imaging.   CT 09/20/12 IMPRESSION:  1. Improved right-sided aeration, with only mild left upper lobe  scarring/atelectasis and minimal left lower lobe reticular nodular  opacity remaining.  2. Slightly decreased small left pleural effusion.  3. Borderline right paratracheal adenopathy is unchanged and  favored to be reactive.  4. Right-sided pleural plaque and calcification. Favored to be  related to prior right empyema or hemothorax. Asbestos related  pleural disease is felt less likely, given absence of left-sided  abnormality.  5. Cardiomegaly and advanced coronary artery atherosclerosis.  6. Right lung pleural based nodules which measure up to 6 mm If   the patient is at high risk for bronchogenic carcinoma, follow-up  chest CT at 6-12 months is recommended. If the patient is at low  risk for bronchogenic carcinoma, follow-up chest CT at 12 months is  recommended. This recommendation follows the consensus statement:  "Guidelines for Management of Small Pulmonary Nodules Detected on  CT Scans: A Statement from the Fleischner Society" as published in  Radiology 2005; 237:395-400. Online at:  DietDisorder.cz.  Original Report Authenticated By: Jeronimo Greaves, M.D.   Review of Systems- see HPI Constitutional:   No weight loss, night sweats,  Fevers, chills, +fatigue, lassitude. HEENT:   No headaches,  Difficulty swallowing,  Tooth/dental problems,  Sore throat,                No sneezing,, ear ache, nasal congestion, post nasal drip,  CV:  No chest pain,  Orthopnea, PND, swelling in lower extremities, anasarca, dizziness, palpitations GI  No heartburn, indigestion, abdominal pain, nausea, vomiting,  Resp: + shortness of breath with exertion or at rest.  No excess mucus, no productive cough,  No non-productive cough,  No coughing up of blood.  No change in color of mucus.  No  wheezing.  Skin:HPI and prior visits. GU: . MS:  No joint pain or swelling.  No decreased range of motion.  No back pain. Psych:  No change in mood or affect. No depression or anxiety.  No memory loss.  Objective:   Physical Exam General- Alert, Oriented, Affect-appropriate, Distress- none acute  Obese, looks tired Skin- +dry skin w/o obvious hives. Excoriated arms. Lymphadenopathy- none Head- atraumatic            Eyes- Gross vision intact, PERRLA, conjunctivae clear secretions            Ears- Hearing, canals normal            Nose- Clear, no-Septal dev, mucus, polyps, erosion, perforation             Throat- Mallampati III , mucosa clear , drainage- none, tonsils- atrophic Neck- flexible , trachea midline, no stridor ,  thyroid nl, carotid no bruit Chest - symmetrical excursion , unlabored           Heart/CV- RRR , no murmur , no gallop  , no rub, nl s1 s2                           - JVD- none , edema- none, stasis changes- none, varices- none           Lung- clear to P&A, wheeze- none, cough- none , dullness-none, rub- none           Chest wall-  Abd- Br/ Gen/ Rectal- Not done, not indicated Extrem- cyanosis- none, clubbing, none, atrophy- none, strength- nl Neuro- grossly intact to observation except chronic droop right corner of mouth

## 2012-09-25 NOTE — Assessment & Plan Note (Signed)
Has remained on allegra, zyrtec, singulair with no recurrence. Discussed gradual wean off these meds as tolerated.

## 2012-09-25 NOTE — Assessment & Plan Note (Signed)
Clinically resolved. Advised her to avoid fatigue and chilling on upcoming beach trip. Incidental pleural plagues unexplained. Small nodules to be followed.

## 2012-09-26 ENCOUNTER — Other Ambulatory Visit: Payer: Self-pay

## 2012-09-26 ENCOUNTER — Other Ambulatory Visit: Payer: Medicare Other

## 2012-09-26 MED ORDER — INSULIN GLARGINE 100 UNIT/ML ~~LOC~~ SOLN: 50.0000 [IU] | Freq: Every day | SUBCUTANEOUS | Status: DC

## 2012-10-03 ENCOUNTER — Inpatient Hospital Stay: Payer: Medicare Other | Admitting: Internal Medicine

## 2012-10-19 ENCOUNTER — Encounter: Payer: Self-pay | Admitting: Endocrinology

## 2012-10-19 ENCOUNTER — Ambulatory Visit (INDEPENDENT_AMBULATORY_CARE_PROVIDER_SITE_OTHER): Payer: Medicare Other | Admitting: Endocrinology

## 2012-10-19 ENCOUNTER — Ambulatory Visit: Payer: Medicare Other | Admitting: Endocrinology

## 2012-10-19 VITALS — BP 128/74 | HR 77 | Ht 65.0 in | Wt 272.0 lb

## 2012-10-19 DIAGNOSIS — E1165 Type 2 diabetes mellitus with hyperglycemia: Secondary | ICD-10-CM

## 2012-10-19 DIAGNOSIS — IMO0002 Reserved for concepts with insufficient information to code with codable children: Secondary | ICD-10-CM

## 2012-10-19 DIAGNOSIS — N058 Unspecified nephritic syndrome with other morphologic changes: Secondary | ICD-10-CM

## 2012-10-19 DIAGNOSIS — E1129 Type 2 diabetes mellitus with other diabetic kidney complication: Secondary | ICD-10-CM

## 2012-10-19 LAB — MICROALBUMIN / CREATININE URINE RATIO
Creatinine,U: 90.4 mg/dL
Microalb Creat Ratio: 156.6 mg/g — ABNORMAL HIGH (ref 0.0–30.0)

## 2012-10-19 LAB — HEMOGLOBIN A1C: Hgb A1c MFr Bld: 8.3 % — ABNORMAL HIGH (ref 4.6–6.5)

## 2012-10-19 MED ORDER — INSULIN GLARGINE 100 UNIT/ML ~~LOC~~ SOLN: 60.0000 [IU] | Freq: Every day | SUBCUTANEOUS | Status: DC

## 2012-10-19 NOTE — Progress Notes (Signed)
Subjective:    Patient ID: [facility], female    DOB: [birth year], 67 y.o.   MRN: [id number]  HPI Pt returns for f/u of insulin-requiring DM (dx'ed; she has mild neuropathy of the lower extremities, and associated retinopathy and renal insufficiency).  she brings a record of her cbg's which i have reviewed today.  It is improved since she started the solumedrol, but it stil varies from 100-200.  It is highest in am, but she does not check at hs.  She has not recently been on steroids.   Past Medical History  Diagnosis Date  . Diabetes mellitus      IDDM;Dr Everardo All  . Toxic multinodular goiter     Dr Everardo All  . OSA (obstructive sleep apnea)     CPAP;Dr Clint Young  . Glaucoma   . Anemia     iron deficinecy  . Asthma   . COPD (chronic obstructive pulmonary disease)   . Depression   . GERD (gastroesophageal reflux disease)   . Hyperlipidemia   . Hypertension   . Hyperthyroidism     in context of nodule; Tapazole therapy since 2007  . Skin cancer     basal cell  . Diverticulosis of colon 2009  . Urticaria, idiopathic   . Renal disease     insufficiency   -- saw Nephrologist  in [place]..hasn't seen one in 2-3 yrs  . Arthritis   . Eczema   . DM (diabetes mellitus)   . Anxiety   . IBS (irritable bowel syndrome)     Past Surgical History  Procedure Laterality Date  . Total abdominal hysterectomy w/ bilateral salpingoophorectomy      sister with ovarian cancer  . Replacement total knee Left 2003    left ; Dr Sullivan Lone; [place]  . Total knee arthroplasty  10/19/2011    Procedure: TOTAL KNEE ARTHROPLASTY;  Surgeon: Loreta Ave, MD;  Location: [facility] OR;  Service: Orthopedics;  Laterality: Right;  DR MURPHY WANTS 90 MINUTES FOR THIS CASE   . Colonoscopy  2006    Next Due date 2016    History   Social History  . Marital Status: Married    Spouse Name: N/A    Number of Children: 2  . Years of Education: N/A   Occupational History  . RN-Retired    Social  History Main Topics  . Smoking status: Never Smoker   . Smokeless tobacco: Never Used  . Alcohol Use: No  . Drug Use: No  . Sexually Active: Not on file   Other Topics Concern  . Not on file   Social History Narrative  . No narrative on file    Current Outpatient Prescriptions on File Prior to Visit  Medication Sig Dispense Refill  . albuterol (PROVENTIL HFA;VENTOLIN HFA) 108 (90 BASE) MCG/ACT inhaler Inhale 2 puffs into the lungs every 4 (four) hours as needed for wheezing.  1 Inhaler  2  . amLODipine (NORVASC) 5 MG tablet Take 5 mg by mouth 2 (two) times daily.      Marland Kitchen aspirin EC 81 MG tablet Take 81 mg by mouth daily with supper.      . B-D UF III MINI PEN NEEDLES 31G X 5 MM MISC As directed      . budesonide-formoterol (SYMBICORT) 80-4.5 MCG/ACT inhaler Inhale 2 puffs into the lungs 2 (two) times daily.      . cetirizine (ZYRTEC) 10 MG tablet Take 10 mg by mouth daily with supper.      Marland Kitchen  Cholecalciferol (VITAMIN D3) 2000 UNITS capsule Take 4,000 Units by mouth daily.      . cloNIDine (CATAPRES) 0.1 MG tablet Take 1 tablet (0.1 mg total) by mouth 2 (two) times daily.  90 tablet  1  . EPINEPHrine (EPIPEN 2-PAK) 0.3 mg/0.3 mL DEVI Inject 0.3 mLs (0.3 mg total) into the muscle once.  1 Device  11  . fexofenadine (ALLEGRA) 180 MG tablet Take 180 mg by mouth daily.      . furosemide (LASIX) 40 MG tablet Take 1 tablet (40 mg total) by mouth daily as needed. For swelling  30 tablet  2  . guaiFENesin-codeine 100-10 MG/5ML syrup Take 10 mLs by mouth 3 (three) times daily.  240 mL  0  . hydrOXYzine (ATARAX/VISTARIL) 10 MG tablet Take 1 tablet (10 mg total) by mouth every 4 (four) hours as needed for itching.  30 tablet  0  . insulin aspart (NOVOLOG FLEXPEN) 100 UNIT/ML injection Inject 20-35 Units into the skin See admin instructions. 30 units in the am, 20 units midday and 35 units in the pm      . Insulin Pen Needle (RELION PEN NEEDLE 31G/8MM) 31G X 8 MM MISC USE AS DIRECTED THREE TIMES DAILY   300 each  2  . Insulin Syringe-Needle U-100 (RELION INSULIN SYRINGE 1ML/31G) 31G X 5/16" 1 ML MISC USE AS DIRECTED ONCE DAILY.  300 each  2  . LORazepam (ATIVAN) 0.5 MG tablet Take 0.5 mg by mouth at bedtime as needed for anxiety. For sleep      . montelukast (SINGULAIR) 10 MG tablet Take 1 tablet (10 mg total) by mouth at bedtime.  90 tablet  1  . nebivolol (BYSTOLIC) 5 MG tablet Take 3 tablets (15 mg total) by mouth daily.  90 tablet  0  . pravastatin (PRAVACHOL) 40 MG tablet Take 40 mg by mouth at bedtime.      . Probiotic Product (PROBIOTIC PO) Take 1 tablet by mouth daily.      . ranitidine (ZANTAC) 150 MG tablet Take 1 tablet (150 mg total) by mouth 2 (two) times daily.  180 tablet  3  . RELION ULTRA THIN PLUS LANCETS MISC As directed        No current facility-administered medications on file prior to visit.    Allergies  Allergen Reactions  . Angiotensin Receptor Blockers Hives    Hives ??  . Latex     rash  . Penicillins Hives    Tolerates Rocephin 08/22/12 - Thuy  . Shellfish-Derived Products Hives and Rash  . Zestril (Lisinopril) Hives  . Spironolactone     Intermittent renal insufficiency  . Coreg (Carvedilol) Hives  . Morphine And Related Itching    Family History  Problem Relation Age of Onset  . Coronary artery disease Father     no MI  . Stroke Mother     in 28s  . Diabetes Mother   . Ovarian cancer Sister     twin sibling  . Alcohol abuse Maternal Grandfather   . Hypothyroidism Sister     twin  . Goiter Sister     resected  . COPD Father   . Breast cancer Sister   . Irritable bowel syndrome Sister     BP 128/74  Pulse 77  Ht 5\' 5"  (1.651 m)  Wt 272 lb (123.378 kg)  BMI 45.26 kg/m2  SpO2 97%  Review of Systems denies hypoglycemia.  She has gained weight    Objective:   Physical  Exam VITAL SIGNS:  See vs page GENERAL: no distress      Assessment & Plan:  DM: This insulin regimen was chosen from multiple options, as it best matches her  insulin to her changing requirements throughout the day.  The benefits of glycemic control must be weighed against the risks of hypoglycemia.

## 2012-10-19 NOTE — Patient Instructions (Addendum)
Please come back for a follow-up appointment in 3 months.   Please increase lantus to 60 units at bedtime. blood tests are being requested for you today.  We'll contact you with results. check your blood sugar 2 times a day.  vary the time of day when you check, between before the 3 meals, and at bedtime.  also check if you have symptoms of your blood sugar being too high or too low.  please keep a record of the readings and bring it to your next appointment here.  please call us sooner if your blood sugar goes below 70, or if it stays over 200.

## 2012-10-25 ENCOUNTER — Other Ambulatory Visit: Payer: Self-pay | Admitting: Internal Medicine

## 2012-10-31 ENCOUNTER — Emergency Department (HOSPITAL_BASED_OUTPATIENT_CLINIC_OR_DEPARTMENT_OTHER)
Admission: EM | Admit: 2012-10-31 | Discharge: 2012-10-31 | Disposition: A | Discharge status: Home / Self Care | Payer: Medicare Other | Attending: Emergency Medicine | Admitting: Emergency Medicine

## 2012-10-31 ENCOUNTER — Emergency Department (HOSPITAL_BASED_OUTPATIENT_CLINIC_OR_DEPARTMENT_OTHER): Payer: Medicare Other

## 2012-10-31 ENCOUNTER — Encounter (HOSPITAL_BASED_OUTPATIENT_CLINIC_OR_DEPARTMENT_OTHER): Payer: Self-pay | Admitting: Emergency Medicine

## 2012-10-31 DIAGNOSIS — R062 Wheezing: Secondary | ICD-10-CM | POA: Insufficient documentation

## 2012-10-31 DIAGNOSIS — E119 Type 2 diabetes mellitus without complications: Secondary | ICD-10-CM | POA: Insufficient documentation

## 2012-10-31 DIAGNOSIS — Z872 Personal history of diseases of the skin and subcutaneous tissue: Secondary | ICD-10-CM | POA: Insufficient documentation

## 2012-10-31 DIAGNOSIS — Z794 Long term (current) use of insulin: Secondary | ICD-10-CM | POA: Insufficient documentation

## 2012-10-31 DIAGNOSIS — Z79899 Other long term (current) drug therapy: Secondary | ICD-10-CM | POA: Insufficient documentation

## 2012-10-31 DIAGNOSIS — Z7982 Long term (current) use of aspirin: Secondary | ICD-10-CM | POA: Insufficient documentation

## 2012-10-31 DIAGNOSIS — Z862 Personal history of diseases of the blood and blood-forming organs and certain disorders involving the immune mechanism: Secondary | ICD-10-CM | POA: Insufficient documentation

## 2012-10-31 DIAGNOSIS — Z91040 Latex allergy status: Secondary | ICD-10-CM | POA: Insufficient documentation

## 2012-10-31 DIAGNOSIS — J4489 Other specified chronic obstructive pulmonary disease: Secondary | ICD-10-CM | POA: Insufficient documentation

## 2012-10-31 DIAGNOSIS — F3289 Other specified depressive episodes: Secondary | ICD-10-CM | POA: Insufficient documentation

## 2012-10-31 DIAGNOSIS — K219 Gastro-esophageal reflux disease without esophagitis: Secondary | ICD-10-CM | POA: Insufficient documentation

## 2012-10-31 DIAGNOSIS — R5383 Other fatigue: Secondary | ICD-10-CM | POA: Insufficient documentation

## 2012-10-31 DIAGNOSIS — J45909 Unspecified asthma, uncomplicated: Secondary | ICD-10-CM | POA: Insufficient documentation

## 2012-10-31 DIAGNOSIS — Z8719 Personal history of other diseases of the digestive system: Secondary | ICD-10-CM | POA: Insufficient documentation

## 2012-10-31 DIAGNOSIS — F411 Generalized anxiety disorder: Secondary | ICD-10-CM | POA: Insufficient documentation

## 2012-10-31 DIAGNOSIS — Z880 Allergy status to penicillin: Secondary | ICD-10-CM | POA: Insufficient documentation

## 2012-10-31 DIAGNOSIS — F329 Major depressive disorder, single episode, unspecified: Secondary | ICD-10-CM | POA: Insufficient documentation

## 2012-10-31 DIAGNOSIS — I10 Essential (primary) hypertension: Secondary | ICD-10-CM | POA: Insufficient documentation

## 2012-10-31 DIAGNOSIS — Z85828 Personal history of other malignant neoplasm of skin: Secondary | ICD-10-CM | POA: Insufficient documentation

## 2012-10-31 DIAGNOSIS — E785 Hyperlipidemia, unspecified: Secondary | ICD-10-CM | POA: Insufficient documentation

## 2012-10-31 DIAGNOSIS — H40009 Preglaucoma, unspecified, unspecified eye: Secondary | ICD-10-CM | POA: Insufficient documentation

## 2012-10-31 DIAGNOSIS — J449 Chronic obstructive pulmonary disease, unspecified: Secondary | ICD-10-CM | POA: Insufficient documentation

## 2012-10-31 DIAGNOSIS — Z8639 Personal history of other endocrine, nutritional and metabolic disease: Secondary | ICD-10-CM | POA: Insufficient documentation

## 2012-10-31 DIAGNOSIS — M129 Arthropathy, unspecified: Secondary | ICD-10-CM | POA: Insufficient documentation

## 2012-10-31 DIAGNOSIS — R5381 Other malaise: Secondary | ICD-10-CM | POA: Insufficient documentation

## 2012-10-31 DIAGNOSIS — G4733 Obstructive sleep apnea (adult) (pediatric): Secondary | ICD-10-CM | POA: Insufficient documentation

## 2012-10-31 DIAGNOSIS — M7989 Other specified soft tissue disorders: Secondary | ICD-10-CM | POA: Insufficient documentation

## 2012-10-31 DIAGNOSIS — J811 Chronic pulmonary edema: Secondary | ICD-10-CM | POA: Insufficient documentation

## 2012-10-31 DIAGNOSIS — E0590 Thyrotoxicosis, unspecified without thyrotoxic crisis or storm: Secondary | ICD-10-CM | POA: Insufficient documentation

## 2012-10-31 LAB — CBC
HCT: 43.2 % (ref 36.0–46.0)
Hemoglobin: 14.2 g/dL (ref 12.0–15.0)
MCH: 29.9 pg (ref 26.0–34.0)
MCHC: 32.9 g/dL (ref 30.0–36.0)
RDW: 14 % (ref 11.5–15.5)

## 2012-10-31 LAB — COMPREHENSIVE METABOLIC PANEL
Albumin: 3.9 g/dL (ref 3.5–5.2)
BUN: 29 mg/dL — ABNORMAL HIGH (ref 6–23)
Calcium: 10.4 mg/dL (ref 8.4–10.5)
GFR calc Af Amer: 44 mL/min — ABNORMAL LOW (ref >90)
Glucose, Bld: 128 mg/dL — ABNORMAL HIGH (ref 70–99)
Potassium: 4.5 mEq/L (ref 3.5–5.1)
Sodium: 138 mEq/L (ref 135–145)
Total Protein: 7.2 g/dL (ref 6.0–8.3)

## 2012-10-31 LAB — URINALYSIS, ROUTINE W REFLEX MICROSCOPIC
Glucose, UA: NEGATIVE mg/dL
Leukocytes, UA: NEGATIVE
Protein, ur: 100 mg/dL — AB
pH: 6 (ref 5.0–8.0)

## 2012-10-31 LAB — URINE MICROSCOPIC-ADD ON

## 2012-10-31 LAB — PRO B NATRIURETIC PEPTIDE: Pro B Natriuretic peptide (BNP): 1016 pg/mL — ABNORMAL HIGH (ref 0–125)

## 2012-10-31 LAB — TROPONIN I: Troponin I: <0.3 ng/mL (ref <0.30)

## 2012-10-31 MED ORDER — FUROSEMIDE 10 MG/ML IJ SOLN
40.0000 mg | Freq: Once | INTRAMUSCULAR | Status: AC
  Administered 2012-10-31: 40 mg via INTRAVENOUS
  Filled 2012-10-31: qty 4

## 2012-10-31 MED ORDER — FUROSEMIDE 20 MG PO TABS: 20.0000 mg | ORAL_TABLET | Freq: Every day | ORAL | Status: DC

## 2012-10-31 NOTE — ED Provider Notes (Signed)
History    CSN: [id number] Arrival date & time 10/31/12  1803  First MD Initiated Contact with Patient 10/31/12 1819     Chief Complaint  Patient presents with  . Hypertension  . Shortness of Breath   (Consider location/radiation/quality/duration/timing/severity/associated sxs/prior Treatment) HPI Pt states she has been "feeling unwell" for greater than 1 month. She has had decreased energy and increased lower ext swelling and DOE. No orthopnea or SOB when sitting. Pt states she has been on lasix in the past and has Rx for lasix PRN but has not used it. No fever, chills, N/V/D, urinary symptoms, cough, chest pain. Pt states she took her BP at home today with SBP >200. Told by her PMD's RN to be eval'd at urgent care.  Past Medical History  Diagnosis Date  . Diabetes mellitus      IDDM;Dr Everardo All  . Toxic multinodular goiter     Dr Everardo All  . OSA (obstructive sleep apnea)     CPAP;Dr Clint Young  . Glaucoma   . Anemia     iron deficinecy  . Asthma   . COPD (chronic obstructive pulmonary disease)   . Depression   . GERD (gastroesophageal reflux disease)   . Hyperlipidemia   . Hypertension   . Hyperthyroidism     in context of nodule; Tapazole therapy since 2007  . Skin cancer     basal cell  . Diverticulosis of colon 2009  . Urticaria, idiopathic   . Renal disease     insufficiency   -- saw Nephrologist  in [place]..hasn't seen one in 2-3 yrs  . Arthritis   . Eczema   . DM (diabetes mellitus)   . Anxiety   . IBS (irritable bowel syndrome)    Past Surgical History  Procedure Laterality Date  . Total abdominal hysterectomy w/ bilateral salpingoophorectomy      sister with ovarian cancer  . Replacement total knee Left 2003    left ; Dr Sullivan Lone; [place]  . Total knee arthroplasty  10/19/2011    Procedure: TOTAL KNEE ARTHROPLASTY;  Surgeon: Loreta Ave, MD;  Location[facility] OR;  Service: Orthopedics;  Laterality: Right;  DR MURPHY WANTS 90 MINUTES FOR THIS  CASE   . Colonoscopy  2006    Next Due date 2016   Family History  Problem Relation Age of Onset  . Coronary artery disease Father     no MI  . Stroke Mother     in 18s  . Diabetes Mother   . Ovarian cancer Sister     twin sibling  . Alcohol abuse Maternal Grandfather   . Hypothyroidism Sister     twin  . Goiter Sister     resected  . COPD Father   . Breast cancer Sister   . Irritable bowel syndrome Sister    History  Substance Use Topics  . Smoking status: Never Smoker   . Smokeless tobacco: Never Used  . Alcohol Use: No   OB History   Grav Para Term Preterm Abortions TAB SAB Ect Mult Living                 Review of Systems  Constitutional: Positive for fatigue. Negative for fever and chills.  HENT: Negative for neck pain and neck stiffness.   Respiratory: Positive for shortness of breath and wheezing. Negative for cough.   Cardiovascular: Positive for leg swelling. Negative for chest pain and palpitations.  Gastrointestinal: Negative for nausea, vomiting and abdominal pain.  Genitourinary: Negative for dysuria, frequency and flank pain.  Musculoskeletal: Negative for myalgias and back pain.  Skin: Negative for rash and wound.  Neurological: Negative for dizziness, syncope, weakness, light-headedness, numbness and headaches.  All other systems reviewed and are negative.    Allergies  Angiotensin receptor blockers; Latex; Penicillins; Shellfish-derived products; Zestril; Spironolactone; Coreg; and Morphine and related  Home Medications   Current Outpatient Rx  Name  Route  Sig  Dispense  Refill  . albuterol (PROVENTIL HFA;VENTOLIN HFA) 108 (90 BASE) MCG/ACT inhaler   Inhalation   Inhale 2 puffs into the lungs every 4 (four) hours as needed for wheezing.   1 Inhaler   2   . amLODipine (NORVASC) 5 MG tablet      TAKE 1 TABLET TWICE A DAY   180 tablet   1   . aspirin EC 81 MG tablet   Oral   Take 81 mg by mouth daily with supper.         . B-D UF  III MINI PEN NEEDLES 31G X 5 MM MISC      As directed         . budesonide-formoterol (SYMBICORT) 80-4.5 MCG/ACT inhaler   Inhalation   Inhale 2 puffs into the lungs 2 (two) times daily.         Marland Kitchen BYSTOLIC 5 MG tablet      TAKE 3 TABLETS DAILY   270 tablet   1   . cetirizine (ZYRTEC) 10 MG tablet   Oral   Take 10 mg by mouth daily with supper.         . Cholecalciferol (VITAMIN D3) 2000 UNITS capsule   Oral   Take 4,000 Units by mouth daily.         . cloNIDine (CATAPRES) 0.1 MG tablet   Oral   Take 1 tablet (0.1 mg total) by mouth 2 (two) times daily.   90 tablet   1   . EPINEPHrine (EPIPEN 2-PAK) 0.3 mg/0.3 mL DEVI   Intramuscular   Inject 0.3 mLs (0.3 mg total) into the muscle once.   1 Device   11   . fexofenadine (ALLEGRA) 180 MG tablet   Oral   Take 180 mg by mouth daily.         . furosemide (LASIX) 20 MG tablet   Oral   Take 1 tablet (20 mg total) by mouth daily.   30 tablet   0   . furosemide (LASIX) 40 MG tablet   Oral   Take 1 tablet (40 mg total) by mouth daily as needed. For swelling   30 tablet   2   . guaiFENesin-codeine 100-10 MG/5ML syrup   Oral   Take 10 mLs by mouth 3 (three) times daily.   240 mL   0   . hydrOXYzine (ATARAX/VISTARIL) 10 MG tablet   Oral   Take 1 tablet (10 mg total) by mouth every 4 (four) hours as needed for itching.   30 tablet   0   . insulin aspart (NOVOLOG FLEXPEN) 100 UNIT/ML injection   Subcutaneous   Inject 20-35 Units into the skin See admin instructions. 30 units in the am, 20 units midday and 35 units in the pm         . insulin glargine (LANTUS) 100 UNIT/ML injection   Subcutaneous   Inject 0.6 mLs (60 Units total) into the skin at bedtime.   60 mL   3   . Insulin Pen Needle (RELION  PEN NEEDLE 31G/8MM) 31G X 8 MM MISC      USE AS DIRECTED THREE TIMES DAILY   300 each   2   . Insulin Syringe-Needle U-100 (RELION INSULIN SYRINGE 1ML/31G) 31G X 5/16" 1 ML MISC      USE AS DIRECTED  ONCE DAILY.   300 each   2   . LORazepam (ATIVAN) 0.5 MG tablet   Oral   Take 0.5 mg by mouth at bedtime as needed for anxiety. For sleep         . montelukast (SINGULAIR) 10 MG tablet   Oral   Take 1 tablet (10 mg total) by mouth at bedtime.   90 tablet   1   . pravastatin (PRAVACHOL) 40 MG tablet   Oral   Take 40 mg by mouth at bedtime.         . Probiotic Product (PROBIOTIC PO)   Oral   Take 1 tablet by mouth daily.         . ranitidine (ZANTAC) 150 MG tablet   Oral   Take 1 tablet (150 mg total) by mouth 2 (two) times daily.   180 tablet   3   . RELION ULTRA THIN PLUS LANCETS MISC      As directed           BP 187/65  Pulse 72  Temp(Src) 98.1 F (36.7 C) (Oral)  Resp 16  Ht 5' 5.5" (1.664 m)  Wt 270 lb (122.471 kg)  BMI 44.23 kg/m2  SpO2 100% Physical Exam  Nursing note and vitals reviewed. Constitutional: She is oriented to person, place, and time. She appears well-developed and well-nourished. No distress.  HENT:  Head: Normocephalic and atraumatic.  Mouth/Throat: Oropharynx is clear and moist.  Eyes: EOM are normal. Pupils are equal, round, and reactive to light.  Neck: Normal range of motion. Neck supple.  Cardiovascular: Normal rate and regular rhythm.   Pulmonary/Chest: Effort normal. No respiratory distress. She has wheezes (mild scattered end exp wheezes, crackles in bases). She has no rales. She exhibits no tenderness.  Abdominal: Soft. Bowel sounds are normal. She exhibits no distension and no mass. There is no tenderness. There is no rebound and no guarding.  Musculoskeletal: Normal range of motion. She exhibits edema (pitting edema LLE>RLE. No calf tenderness ). She exhibits no tenderness.  Neurological: She is alert and oriented to person, place, and time.  5/5 motor in all ext, sensation intact  Skin: Skin is warm and dry. No rash noted. No erythema.  Psychiatric: She has a normal mood and affect. Her behavior is normal.    ED  Course  Procedures (including critical care time) Labs Reviewed  CBC - Abnormal; Notable for the following:    WBC 13.3 (*)    All other components within normal limits  COMPREHENSIVE METABOLIC PANEL - Abnormal; Notable for the following:    Glucose, Bld 128 (*)    BUN 29 (*)    Creatinine, Ser 1.40 (*)    GFR calc non Af Amer 38 (*)    GFR calc Af Amer 44 (*)    All other components within normal limits  URINALYSIS, ROUTINE W REFLEX MICROSCOPIC - Abnormal; Notable for the following:    Protein, ur 100 (*)    All other components within normal limits  PRO B NATRIURETIC PEPTIDE - Abnormal; Notable for the following:    Pro B Natriuretic peptide (BNP) 1016.0 (*)    All other components within normal limits  TROPONIN I  URINE MICROSCOPIC-ADD ON   Dg Chest 2 View  10/31/2012   *RADIOLOGY REPORT*  Clinical Data: Hypotension, shortness of breath  CHEST - 2 VIEW  Comparison: Prior chest x-ray 08/31/2012; prior chest CT 09/20/2012  Findings: Stable cardiomegaly.  Pulmonary vascular congestion bordering on mild interstitial edema.  No focal airspace consolidation.  Previously identified linear atelectasis versus scarring in the superior segment left lower lobe has resolved.  No pleural effusion or pneumothorax.  No acute osseous abnormality.  IMPRESSION:  1.  No acute cardiopulmonary disease. 2.  Stable cardiomegaly and pulmonary vascular congestion. 3.  Interval resolution of linear atelectasis from the superior segment of the left lower lobe.   Original Report Authenticated By: Malachy Moan, M.D.   1. Pulmonary edema      Date: 10/31/2012  Rate: 70  Rhythm: normal sinus rhythm  QRS Axis: normal  Intervals: QRS borderline  ST/T Wave abnormalities: nonspecific T wave changes  Conduction Disutrbances:incomplete RBBB  Narrative Interpretation:   Old EKG Reviewed: changes noted   MDM  Pt has diuresed significantly and states she is feeling much better. Ambulate to restroom without  assistance. Will restart Lasix and pt advised to F/u with PMD in 1-2 days to adjust as needed and refer to cardiologist as needed. Pt given return precautions.   Loren Racer, MD 10/31/12 2027

## 2012-10-31 NOTE — ED Notes (Signed)
Went to fire dept to have bp checked today and it was high.  Sts she has not been feeling well "for a while." PMD could not see her today so he sent her to "Urgent Care".

## 2012-10-31 NOTE — ED Notes (Signed)
Resting SpO2 was 94% on RA HR 71. A lap around the ER SPO2 92% and HR 94.  Pt states "Im not as SHOB. It helps to get some fluid off."

## 2012-10-31 NOTE — ED Notes (Signed)
MD at bedside. 

## 2012-11-05 ENCOUNTER — Encounter: Payer: Self-pay | Admitting: Internal Medicine

## 2012-11-05 ENCOUNTER — Ambulatory Visit (INDEPENDENT_AMBULATORY_CARE_PROVIDER_SITE_OTHER): Payer: Medicare Other | Admitting: Internal Medicine

## 2012-11-05 VITALS — BP 152/70 | HR 51 | Temp 98.6°F | Resp 14 | Wt 273.0 lb

## 2012-11-05 DIAGNOSIS — R799 Abnormal finding of blood chemistry, unspecified: Secondary | ICD-10-CM

## 2012-11-05 DIAGNOSIS — N289 Disorder of kidney and ureter, unspecified: Secondary | ICD-10-CM

## 2012-11-05 DIAGNOSIS — R7989 Other specified abnormal findings of blood chemistry: Secondary | ICD-10-CM

## 2012-11-05 DIAGNOSIS — G4733 Obstructive sleep apnea (adult) (pediatric): Secondary | ICD-10-CM

## 2012-11-05 DIAGNOSIS — I10 Essential (primary) hypertension: Secondary | ICD-10-CM

## 2012-11-05 NOTE — Patient Instructions (Addendum)
Adequate Blood Pressure Goal= AVERAGE < 140/90. This AVERAGE should be calculated from @ least 5-7 BP readings taken @ different times of day on different days of week. You should not respond to isolated BP readings , but rather the AVERAGE for that week .Please bring your  blood pressure cuff to office visits to verify that it is reliable.It  can also be checked against the blood pressure device at the pharmacy.

## 2012-11-05 NOTE — Progress Notes (Signed)
  Subjective:    Patient ID: Valerie Stevenson, female    DOB: Feb 10, 1946, 67 y.o.   MRN: [id number]  HPI  She was seen at the emergency room 10/31/12 for poorly controlled hypertension with associated shortness of breath. On exam she was found to have wheezing and edema. Her furosemide 20 mg was recommended daily until followup could be completed  Her blood pressure had been as high as 208/90.  Records and images were reviewed. Her creatinine is 1.4; GFR 38; BUN 29.  Chest x-ray revealed cardiomegaly with vascular accentuation. Osteophytes were noted on lateral film. Previously noted atelectasis left lower lobe had resolved.  Since ER visit her blood pressures have improved somewhat but still remain in the 170-179 systolic range.  Significantly her BNP was 3373  @ [facility] 07/05/11. This was not repeated 7/9.   Review of Systems She states that Dr. Maple Hudson prescribed a new CPAP machine approximately 2 years ago. Patient is compliant with  BP medications No adverse effects noted from medication. No exercise program except water aerobics up to 2 X/ week. On  low-salt diet; no carb restriction.  Since ER visit no chest pain, palpitations, dyspnea, claudication,edema or paroxysmal nocturnal dyspnea described. No significant lightheadedness, headache, epistaxis, or syncope            Objective:   Physical Exam   Appears  well-nourished & in no acute distress. Central weigh excess  No carotid bruits are present.No neck pain distention present at 10 - 15 degrees. Thyroid normal to palpation  Heart rhythm regular but slow  with no significant murmurs or gallops.  Chest is clear with no increased work of breathing  There is no evidence of aortic aneurysm or renal artery bruits  Abdomen protuberant without organomegaly or masses. No HJR.  No clubbing, cyanosis . Trace L ankle edema present.  Pedal pulses are intact   No ischemic skin changes are present .  Nails  Healthy   Alert and oriented. Strength, tone, DTRs reflexes normal       Assessment & Plan:  See Current Assessment & Plan in Problem List under specific Diagnosis

## 2012-11-05 NOTE — Assessment & Plan Note (Signed)
It is recommended that adequate control of her sleep apnea be verified in view of the comorbidities present

## 2012-11-05 NOTE — Assessment & Plan Note (Signed)
Clinically furosemide 40 mg daily seems appropriate in view of the renal compromise.

## 2012-11-05 NOTE — Assessment & Plan Note (Signed)
Furosemide will be increased to 40 mg daily with monitoring of blood pressure and no other med change.

## 2012-11-05 NOTE — Assessment & Plan Note (Signed)
The clinical picture is indeed worrisome for recurrent cardiac decompensation in the context of suboptimally controlled hypertension, renal insufficiency, diabetes, and sleep apnea. Cardiology consultation will be pursued.

## 2012-11-19 ENCOUNTER — Telehealth: Payer: Self-pay | Admitting: Internal Medicine

## 2012-11-19 DIAGNOSIS — I10 Essential (primary) hypertension: Secondary | ICD-10-CM

## 2012-11-19 NOTE — Telephone Encounter (Signed)
Dr Valerie Stevenson, the patient is requesting a 90 day supply for [facility]. Last OV 11/05/2012 Please advise.

## 2012-11-19 NOTE — Telephone Encounter (Signed)
#  90 , RX 3 

## 2012-11-19 NOTE — Telephone Encounter (Signed)
Patient states that she needs her Lasix Rx refilled through [facility]. She is about to run out. She says she asked for this refill at her 7.14.14 appointment but was unsure if we had sent it.

## 2012-11-20 MED ORDER — FUROSEMIDE 40 MG PO TABS: 40.0000 mg | ORAL_TABLET | Freq: Every day | ORAL | Status: DC | PRN

## 2012-11-20 NOTE — Telephone Encounter (Signed)
Sent Rx to [facility] per Dr. Frederik Pear ok. LVM for patient that all was taken care of.

## 2012-12-25 ENCOUNTER — Other Ambulatory Visit: Payer: Self-pay | Admitting: Internal Medicine

## 2012-12-26 ENCOUNTER — Ambulatory Visit (INDEPENDENT_AMBULATORY_CARE_PROVIDER_SITE_OTHER): Payer: Medicare Other | Admitting: Internal Medicine

## 2012-12-26 ENCOUNTER — Encounter: Payer: Self-pay | Admitting: Internal Medicine

## 2012-12-26 VITALS — BP 140/78 | HR 54 | Ht 65.0 in | Wt 272.8 lb

## 2012-12-26 DIAGNOSIS — Z23 Encounter for immunization: Secondary | ICD-10-CM

## 2012-12-26 DIAGNOSIS — L501 Idiopathic urticaria: Secondary | ICD-10-CM

## 2012-12-26 DIAGNOSIS — G4733 Obstructive sleep apnea (adult) (pediatric): Secondary | ICD-10-CM

## 2012-12-26 NOTE — Patient Instructions (Addendum)
Restart Allegra 180 once daily. After 3-4 days if you still itch, then restart Zyrtec in the evening, then Vitamin D3 as before. We can add other meds if needed.  Order- DME [facility]- download for CPAP pressure evaluation        Dx OSA  We anticipate Chest CT in May, 2015 to follow up the lung nodules, and will schedule that closer to the time.

## 2012-12-26 NOTE — Progress Notes (Signed)
Patient ID: Valerie Stevenson, female    DOB: 1945/05/27, 67 y.o.   MRN: [id number]  HPI 09/02/10- 54 yoF w/ hx urticaria complicated by asthma/ COPD, food allergy to shellfish, RAI for thyroid Last here 08/17/10 for initial visit for Dr Linna Darner, with onset of hives 3 weeks previously. Discussed chronic lisinopril. Better with hives but still getting some especially as she gets up in the morning- shower makes them more visible. She has been taking Allegra 180, with Zantac 300mg  bid. She remains itchy. Had tried doxepin 10- no help and didn't like it. Dr Linna Darner gave Ativan.  Allergy profile-  IgE 296, but no specific elevations, WBC 16,600; No eos. No overt infection.  12/15/10- 35 yoF w/ hx urticaria complicated by asthma/ COPD, food allergy to shellfish, RAI for thyroid Continues to complain of itching with visible hives every day. Dr Loanne Drilling felt her thyroid hormone levels were not the problem, but the underlying cause of her thyroid disease may be related. She has tried zantac now at 150 twice daily, with Singulair. She came off allegra, which wasn't helping. Doxepin didn't help and not well tolerated. She has used Ativan to reduce irritability but it makes her too sleepy. I explained that urticaria may not be allergic, may be related to any of her medications, and may be related to her thyroid or diabetes disorders. There has not been evidence of malignancy. CT chest was negative except for thyroid nodule. Sedimentation rate was elevated at 57. ANA was negative. IgE was elevated at 266.1 but food profile was negative. Specific seafood profile showed mild elevations for crab and shrimp. IgG food profile results are being sought.  12/29/20-64 yoF w/ hx urticaria complicated by asthma/ COPD, food allergy to shellfish, RAI for thyroid Here today for new problem- concern of sleep disorder Documented hx OSA on CPAP. Needs to establish here for update Rx for machine and supplies. Her current CPAP at 10 is used  all night, every night. She had a sleep study in [place] to establish the diagnosis. Her current machine is 80 or 68 years old and needs to be replaced. She has not been using a humidifier. Nasal pillows mask. She is satisfied with her treatment and only asks that we help get her replacement machine and maintain followup through this office. Sleep hygiene: Bedtime between 9 and 10 PM, sleep latency 10-15 minutes, waking 2 or 3 times each night for finally up between 7:30 and 8 AM. Weight has gone up 25 pounds in the last 2 years. She feels restless at night and will get up to sit on the side of the bed. This is mostly do to her ongoing urticaria. No history of ENT surgery.  Problem of urticaria is ongoing. She is pending allergy evaluation at [facility]. I suspect relationship to her thyroid disease. Other medical problems include kidney disease elevated cholesterol diabetes palpitations and hypertension. There is no history of heart or lung disease otherwise. Lab- IgG food allergy panel showed elevations for milk and egg white. She understands this information is not firmly validated, and is of value only to raise awareness. She will try to avoid these for a month and see if that affects her urticaria. She does still believe that some food is the basis for her hives, although we have discussed other etiologies.   06/21/11- 60 yoF w/ hx urticaria complicated by asthma/ COPD, food allergy to shellfish, RAI for thyroid, OSA/ CPAP Likes her new CPAP machine which is smaller.  Wears it all night every night and can't sleep without it. Using nasal pillows mask/[facility]/10 cwp. Has not had urticaria in the past month, off meds.. I had sent her to [facility] where evaluation there put her on similar medication-Allegra, Singulair, Zantac. He told her basically the same things that I have told her.  06/20/12-  66 yoF w/ hx urticaria complicated by asthma/ COPD, food allergy to shellfish, RAI for thyroid, OSA/  CPAP FOLLOWS FOR: wears CPAP every night for about 8 hours; has not worn in past 2 nights as she has stayed at the hospital with her sister in law due to surgery; would also like to know what could be causing her hives/itching--questionable reaction to new medicine (coreg) Little sleep for last 2 nights, helping family after knee surgery. Has had to miss CPAP 10/ [facility] for this.Very tired. Hives/ itching flared again after restarted on Coreg 3-4 weeks ago, which seemed to trigger before. We and [facility] had not found specific trigger 2 years ago. Dermatologist told her she has eczema- take Aveeno.   09/25/12-  58 yoF never smoker w/ hx urticaria complicated by asthma/ COPD/ abnl CT, food allergy to shellfish, RAI for thyroid, OSA/ CPAP, DM,  [facility] per [facility] CT results with patient as well. Acute Hosp 4/27-08/26/12 for CAP, multilobar, after 2 weeks malaise. Now back to baseline. Less SOB, no cough or phlegm, off ABX. No more urticaria, but has continued allegra/ zyrtec/ singulair. We discussed weaning off. CPAP 10/ [facility] remains good, al night, every night. CT chest in hosp abnormal- reviewed with her today. Denies any prior hx chest wall injury, pneumonia or asbestos exposure or any aspiration or syncope to explain imaging.   CT 09/20/12 IMPRESSION:  1. Improved right-sided aeration, with only mild left upper lobe  scarring/atelectasis and minimal left lower lobe reticular nodular  opacity remaining.  2. Slightly decreased small left pleural effusion.  3. Borderline right paratracheal adenopathy is unchanged and  favored to be reactive.  4. Right-sided pleural plaque and calcification. Favored to be  related to prior right empyema or hemothorax. Asbestos related  pleural disease is felt less likely, given absence of left-sided  abnormality.  5. Cardiomegaly and advanced coronary artery atherosclerosis.  6. Right lung pleural based nodules which measure up to 6 mm If   the patient is at high risk for bronchogenic carcinoma, follow-up  chest CT at 6-12 months is recommended. If the patient is at low  risk for bronchogenic carcinoma, follow-up chest CT at 12 months is  recommended. This recommendation follows the consensus statement:  "Guidelines for Management of Small Pulmonary Nodules Detected on  CT Scans: A Statement from the [place]" as published in  Radiology 2005; 237:395-400. Online at:  https://www.arnold.com/.  Original Report Authenticated By: Abigail Miyamoto, M.D.  12/26/12- 58 yoF never smoker w/ hx urticaria complicated by asthma/ COPD/ abnl CT/ nodules/ plaque, food allergy to shellfish, RAI for thyroid, OSA/ CPAP, DM,  Free of hives since spring, until flare again one week ago. Now hands itch. She associates this with a sense of "knots" under the skin at hairline back of neck. She was evaluated for hypertension with chest x-ray showing CE and vascular congestion. Now pending cardiology evaluation. CPAP 10/[facility] with good compliance and control CXR 10/31/12 IMPRESSION:  1. No acute cardiopulmonary disease.  2. Stable cardiomegaly and pulmonary vascular congestion.  3. Interval resolution of linear atelectasis from the superior  segment of the left lower lobe.  Original Report Authenticated  By: Malachy Moan, M.D.   Review of Systems- see HPI Constitutional:   No weight loss, night sweats,  Fevers, chills, +fatigue, lassitude. HEENT:   No headaches,  Difficulty swallowing,  Tooth/dental problems,  Sore throat,                No sneezing,, ear ache, nasal congestion, post nasal drip,  CV:  No chest pain,  Orthopnea, PND, swelling in lower extremities, anasarca, dizziness, palpitations GI  No heartburn, indigestion, abdominal pain, nausea, vomiting,  Resp: + shortness of breath with exertion or at rest.  No excess mucus, no productive cough,  No non-productive cough,  No coughing up of blood.  No  change in color of mucus.  No wheezing.  Skin:HPI and prior visits. GU: . MS:  No joint pain or swelling.  No decreased range of motion.  No back pain. Psych:  No change in mood or affect. No depression or anxiety.  No memory loss.  Objective:   Physical Exam General- Alert, Oriented, Affect-appropriate, Distress- none acute  Obese, looks tired Skin- +dry skin w/o obvious hives. Excoriated arms. Hands a little puffy??, No obvious "knots" on neck. Lymphadenopathy- none Head- atraumatic            Eyes- Gross vision intact, PERRLA, conjunctivae clear secretions            Ears- Hearing, canals normal            Nose- Clear, no-Septal dev, mucus, polyps, erosion, perforation             Throat- Mallampati III , mucosa clear , drainage- none, tonsils- atrophic Neck- flexible , trachea midline, no stridor , thyroid nl, carotid no bruit Chest - symmetrical excursion , unlabored           Heart/CV- RRR , no murmur , no gallop  , no rub, nl s1 s2                           - JVD- none , edema- none, stasis changes- none, varices- none           Lung- clear to P&A, wheeze- none, cough- none , dullness-none, rub- none           Chest wall-  Abd- Br/ Gen/ Rectal- Not done, not indicated Extrem- cyanosis- none, clubbing, none, atrophy- none, strength- nl Neuro- grossly intact to observation except chronic droop right corner of mouth

## 2012-12-31 ENCOUNTER — Ambulatory Visit: Payer: Medicare Other | Admitting: Cardiovascular Disease

## 2013-01-01 ENCOUNTER — Telehealth: Payer: Self-pay | Admitting: Internal Medicine

## 2013-01-01 MED ORDER — PREDNISONE 10 MG PO TABS: ORAL_TABLET | ORAL | Status: DC

## 2013-01-01 NOTE — Telephone Encounter (Signed)
Per CY---ok to call in pred 8 day taper.  Called and spoke with pt and she is aware that this has been sent to her pharmacy. Nothing further is needed.

## 2013-01-01 NOTE — Telephone Encounter (Signed)
Spoke with the pt She states tha there itching and hives are worse since last visit 12-26-12 She states that her arms, hands and chin are itchy and left eye is swelling since this am She is taking allegra 180 mg qd and added back the zyrtec 10 mg in the evening  Wants further recs Not sleeping due to itching Please advise, thanks! Allergies  Allergen Reactions  . Angiotensin Receptor Blockers Hives    Hives ??  . Latex     rash  . Penicillins Hives    Tolerates Rocephin 08/22/12 - Thuy  . Shellfish-Derived Products Hives and Rash  . Zestril [Lisinopril] Hives  . Spironolactone     Intermittent renal insufficiency  . Coreg [Carvedilol] Hives  . Morphine And Related Itching

## 2013-01-02 ENCOUNTER — Telehealth: Payer: Self-pay | Admitting: Endocrinology

## 2013-01-02 NOTE — Telephone Encounter (Signed)
Pt advised and states an understanding 

## 2013-01-02 NOTE — Telephone Encounter (Signed)
probably due to the steroid rx you got. Take an extra 10 units of novolog for any cbg in the 200's, and 20 extra for any over 300.  Call 1-2 days if not better

## 2013-01-02 NOTE — Assessment & Plan Note (Signed)
She has had pruritus recently but not much visible urticaria. Plan-continue Singulair, Zyrtec, Allegra

## 2013-01-02 NOTE — Assessment & Plan Note (Signed)
Good CPAP compliance and control at 10. Plan-gets download from [facility]

## 2013-01-07 ENCOUNTER — Other Ambulatory Visit: Payer: Self-pay | Admitting: Internal Medicine

## 2013-01-07 NOTE — Telephone Encounter (Signed)
Med filled.  

## 2013-01-17 ENCOUNTER — Other Ambulatory Visit: Payer: Self-pay | Admitting: Internal Medicine

## 2013-01-21 ENCOUNTER — Encounter: Payer: Self-pay | Admitting: Endocrinology

## 2013-01-21 ENCOUNTER — Ambulatory Visit (INDEPENDENT_AMBULATORY_CARE_PROVIDER_SITE_OTHER): Payer: Medicare Other | Admitting: Endocrinology

## 2013-01-21 VITALS — BP 136/78 | HR 78 | Wt 274.0 lb

## 2013-01-21 DIAGNOSIS — IMO0002 Reserved for concepts with insufficient information to code with codable children: Secondary | ICD-10-CM

## 2013-01-21 DIAGNOSIS — N058 Unspecified nephritic syndrome with other morphologic changes: Secondary | ICD-10-CM

## 2013-01-21 DIAGNOSIS — E1129 Type 2 diabetes mellitus with other diabetic kidney complication: Secondary | ICD-10-CM

## 2013-01-21 NOTE — Patient Instructions (Addendum)
Please come back for a follow-up appointment in 3 months.   Please change the novolog to the amounts noted below.  blood tests are being requested for you today.  We'll contact you with results. check your blood sugar 2 times a day.  vary the time of day when you check, between before the 3 meals, and at bedtime.  also check if you have symptoms of your blood sugar being too high or too low.  please keep a record of the readings and bring it to your next appointment here.  please call us sooner if your blood sugar goes below 70, or if it stays over 200.

## 2013-01-21 NOTE — Progress Notes (Signed)
Subjective:    Patient ID: Valerie Stevenson, female    DOB: 1945-06-29, 67 y.o.   MRN: [id number]  HPI Pt returns for f/u of insulin-requiring DM (dx'ed with GDM in 1981, and DM outside of pregnancy in approx 1986; she has mild neuropathy of the lower extremities, and associated retinopathy and renal insufficiency; she has never had severe hypoglycemia or DKA).   She was recently back on another course of steroids, for urticaria.  She finished this approx 2 weeks ago.  She called with high cbg's, so we added extra prn novolog.  she brings a record of her cbg's which i have reviewed today. It varies from 68-200.  It is lowest with lunch, and highest at hs.   Past Medical History  Diagnosis Date  . Diabetes mellitus      IDDM;Dr Everardo All  . Toxic multinodular goiter     Dr Everardo All  . OSA (obstructive sleep apnea)     CPAP;Dr Clint Young  . Glaucoma   . Anemia     iron deficinecy  . Asthma   . COPD (chronic obstructive pulmonary disease)   . Depression   . GERD (gastroesophageal reflux disease)   . Hyperlipidemia   . Hypertension   . Hyperthyroidism     in context of nodule; Tapazole therapy since 2007  . Skin cancer     basal cell  . Diverticulosis of colon 2009  . Urticaria, idiopathic   . Renal disease     insufficiency   -- saw Nephrologist  in [place]..hasn't seen one in 2-3 yrs  . Arthritis   . Eczema   . DM (diabetes mellitus)   . Anxiety   . IBS (irritable bowel syndrome)     Past Surgical History  Procedure Laterality Date  . Total abdominal hysterectomy w/ bilateral salpingoophorectomy      sister with ovarian cancer  . Replacement total knee Left 2003    left ; Dr Sullivan Lone; [place]  . Total knee arthroplasty  10/19/2011    Procedure: TOTAL KNEE ARTHROPLASTY;  Surgeon: Loreta Ave, MD;  Location: [facility] OR;  Service: Orthopedics;  Laterality: Right;  DR MURPHY WANTS 90 MINUTES FOR THIS CASE   . Colonoscopy  2006    Next Due date 2016    History    Social History  . Marital Status: Married    Spouse Name: N/A    Number of Children: 2  . Years of Education: N/A   Occupational History  . RN-Retired    Social History Main Topics  . Smoking status: Never Smoker   . Smokeless tobacco: Never Used  . Alcohol Use: No  . Drug Use: No  . Sexual Activity: Not on file   Other Topics Concern  . Not on file   Social History Narrative  . No narrative on file    Current Outpatient Prescriptions on File Prior to Visit  Medication Sig Dispense Refill  . albuterol (PROVENTIL HFA;VENTOLIN HFA) 108 (90 BASE) MCG/ACT inhaler Inhale 2 puffs into the lungs every 4 (four) hours as needed for wheezing.  1 Inhaler  2  . amLODipine (NORVASC) 5 MG tablet TAKE 1 TABLET TWICE A DAY  180 tablet  1  . aspirin EC 81 MG tablet Take 81 mg by mouth daily with supper.      . B-D UF III MINI PEN NEEDLES 31G X 5 MM MISC As directed      . budesonide-formoterol (SYMBICORT) 80-4.5 MCG/ACT inhaler Inhale 2 puffs into  the lungs 2 (two) times daily as needed.       Marland Kitchen BYSTOLIC 5 MG tablet TAKE 3 TABLETS DAILY  270 tablet  1  . cloNIDine (CATAPRES) 0.1 MG tablet Take 1 tablet (0.1 mg total) by mouth 2 (two) times daily.  180 tablet  0  . EPINEPHrine (EPIPEN 2-PAK) 0.3 mg/0.3 mL DEVI Inject 0.3 mLs (0.3 mg total) into the muscle once.  1 Device  11  . furosemide (LASIX) 40 MG tablet Take 1 tablet (40 mg total) by mouth daily as needed. For swelling  90 tablet  3  . guaiFENesin-codeine 100-10 MG/5ML syrup Take 10 mLs by mouth 3 (three) times daily.  240 mL  0  . hydrOXYzine (ATARAX/VISTARIL) 10 MG tablet Take 1 tablet (10 mg total) by mouth every 4 (four) hours as needed for itching.  30 tablet  0  . insulin aspart (NOVOLOG FLEXPEN) 100 UNIT/ML injection Inject 20-35 Units into the skin See admin instructions. 30 units in the am, 20 units midday and 35 units in the pm      . insulin glargine (LANTUS) 100 UNIT/ML injection Inject 0.6 mLs (60 Units total) into the skin  at bedtime.  60 mL  3  . Insulin Pen Needle (RELION PEN NEEDLE 31G/8MM) 31G X 8 MM MISC USE AS DIRECTED THREE TIMES DAILY  300 each  2  . Insulin Syringe-Needle U-100 (RELION INSULIN SYRINGE 1ML/31G) 31G X 5/16" 1 ML MISC USE AS DIRECTED ONCE DAILY.  300 each  2  . LORazepam (ATIVAN) 0.5 MG tablet Take 0.5 mg by mouth at bedtime as needed for anxiety. For sleep      . pravastatin (PRAVACHOL) 40 MG tablet TAKE 1 TABLET AT BEDTIME  90 tablet  0  . predniSONE (DELTASONE) 10 MG tablet Take 4 tabs x 2 days, 3 tabs x 2 days, 2 tabs x 2 days, 1 tab x 2 days then stop  20 tablet  0  . Probiotic Product (PROBIOTIC PO) Take 1 tablet by mouth daily.      . ranitidine (ZANTAC) 150 MG tablet Take 1 tablet (150 mg total) by mouth 2 (two) times daily.  180 tablet  3  . RELION ULTRA THIN PLUS LANCETS MISC As directed        No current facility-administered medications on file prior to visit.    Allergies  Allergen Reactions  . Angiotensin Receptor Blockers Hives    Hives ??  . Latex     rash  . Penicillins Hives    Tolerates Rocephin 08/22/12 - Thuy  . Shellfish-Derived Products Hives and Rash  . Zestril [Lisinopril] Hives  . Spironolactone     Intermittent renal insufficiency  . Coreg [Carvedilol] Hives  . Morphine And Related Itching    Family History  Problem Relation Age of Onset  . Coronary artery disease Father     no MI  . Stroke Mother     in 89s  . Diabetes Mother   . Ovarian cancer Sister     twin sibling  . Alcohol abuse Maternal Grandfather   . Hypothyroidism Sister     twin  . Goiter Sister     resected  . COPD Father   . Breast cancer Sister   . Irritable bowel syndrome Sister     BP 136/78  Pulse 78  Wt 274 lb (124.286 kg)  BMI 45.6 kg/m2  SpO2 97%    Review of Systems Denies LOC and weight change.  Objective:   Physical Exam VITAL SIGNS:  See vs page GENERAL: no distress SKIN:  Insulin injection sites at the anterior abdomen are normal.  Urticaria is  noted.        Assessment & Plan:  DM: This insulin regimen was chosen from multiple options, as it best matches her insulin to her changing requirements throughout the day.  The benefits of glycemic control must be weighed against the risks of hypoglycemia.  Urticaria: the steroid rx  complicates the rx of DM, but she needs it.

## 2013-01-23 ENCOUNTER — Telehealth: Payer: Self-pay | Admitting: Endocrinology

## 2013-01-23 NOTE — Telephone Encounter (Signed)
Please increase novolog to 3 times a day (just before each meal) 35-25-40 units.  Call in 2 days if it is still high.

## 2013-01-23 NOTE — Telephone Encounter (Signed)
Pt advised.

## 2013-01-23 NOTE — Telephone Encounter (Signed)
Pt is concerned about being on Prednisone raising her cbg

## 2013-01-31 ENCOUNTER — Encounter: Payer: Self-pay | Admitting: Cardiovascular Disease

## 2013-01-31 ENCOUNTER — Ambulatory Visit (INDEPENDENT_AMBULATORY_CARE_PROVIDER_SITE_OTHER): Payer: Medicare Other | Admitting: Cardiovascular Disease

## 2013-01-31 VITALS — BP 126/52 | HR 61 | Ht 65.0 in | Wt 276.0 lb

## 2013-01-31 DIAGNOSIS — I5189 Other ill-defined heart diseases: Secondary | ICD-10-CM | POA: Insufficient documentation

## 2013-01-31 DIAGNOSIS — R0602 Shortness of breath: Secondary | ICD-10-CM

## 2013-01-31 MED ORDER — LOSARTAN POTASSIUM 50 MG PO TABS: 50.0000 mg | ORAL_TABLET | Freq: Every day | ORAL | Status: DC

## 2013-01-31 NOTE — Progress Notes (Signed)
Patient ID: Valerie Stevenson, female   DOB: October 26, 1945, 67 y.o.   MRN: [id number]     66 yo diabetic referred for dyspnea  DM poorly controlled with recent A1c 8l3  She has been obese and sedentary for years. Has cyclical hives etiology not clear. Allergy to ACE inhibitors not accurate in 2012 when she had hives lisinopril was stopped but she had no change in her symptoms and has had persistant hives since. No history of angioedema  Elevated BNP likely represents diastolic dysfunciton Echo without marked changes.  No history of CAD Signficant weight gain last year.  No chest pain.  On insulin for years.    Echo 5/14  Normal EF no valve disease LE venousl duplex no DVT BNP elevated at 1016    ROS: Denies fever, malais, weight loss, blurry vision, decreased visual acuity, cough, sputum, SOB, hemoptysis, pleuritic pain, palpitaitons, heartburn, abdominal pain, melena, lower extremity edema, claudication, or rash.  All other systems reviewed and negative   General: Affect appropriate Obese white female  HEENT: normal Neck supple with no adenopathy JVP normal no bruits no thyromegaly Lungs clear with no wheezing and good diaphragmatic motion Heart:  S1/S2 benign SEM murmur,rub, gallop or click PMI normal Abdomen: benighn, BS positve, no tenderness, no AAA no bruit.  No HSM or HJR Distal pulses intact with no bruits No edema Neuro non-focal Skin warm and dry No muscular weakness  Medications Current Outpatient Prescriptions  Medication Sig Dispense Refill  . albuterol (PROVENTIL HFA;VENTOLIN HFA) 108 (90 BASE) MCG/ACT inhaler Inhale 2 puffs into the lungs every 4 (four) hours as needed for wheezing.  1 Inhaler  2  . amLODipine (NORVASC) 5 MG tablet TAKE 1 TABLET TWICE A DAY  180 tablet  1  . aspirin EC 81 MG tablet Take 81 mg by mouth daily with supper.      . B-D UF III MINI PEN NEEDLES 31G X 5 MM MISC As directed      . budesonide-formoterol (SYMBICORT) 80-4.5 MCG/ACT inhaler Inhale  2 puffs into the lungs 2 (two) times daily as needed.       Marland Kitchen BYSTOLIC 5 MG tablet TAKE 3 TABLETS DAILY  270 tablet  1  . Cetirizine HCl (ZYRTEC ALLERGY PO) Take 1 tablet by mouth 2 (two) times daily.      . cloNIDine (CATAPRES) 0.1 MG tablet Take 1 tablet (0.1 mg total) by mouth 2 (two) times daily.  180 tablet  0  . EPINEPHrine (EPIPEN 2-PAK) 0.3 mg/0.3 mL DEVI Inject 0.3 mLs (0.3 mg total) into the muscle once.  1 Device  11  . Fexofenadine HCl (ALLEGRA PO) Take 1 tablet by mouth 2 (two) times daily.      . furosemide (LASIX) 40 MG tablet Take 1 tablet (40 mg total) by mouth daily as needed. For swelling  90 tablet  3  . guaiFENesin-codeine 100-10 MG/5ML syrup Take 10 mLs by mouth 3 (three) times daily.  240 mL  0  . hydrOXYzine (ATARAX/VISTARIL) 10 MG tablet Take 1 tablet (10 mg total) by mouth every 4 (four) hours as needed for itching.  30 tablet  0  . insulin aspart (NOVOLOG FLEXPEN) 100 UNIT/ML injection 3 times a day (just before each meal) 25-20-40 units      . insulin glargine (LANTUS) 100 UNIT/ML injection Inject 0.6 mLs (60 Units total) into the skin at bedtime.  60 mL  3  . Insulin Pen Needle (RELION PEN NEEDLE 31G/8MM) 31G X 8 MM  MISC USE AS DIRECTED THREE TIMES DAILY  300 each  2  . Insulin Syringe-Needle U-100 (RELION INSULIN SYRINGE 1ML/31G) 31G X 5/16" 1 ML MISC USE AS DIRECTED ONCE DAILY.  300 each  2  . LORazepam (ATIVAN) 0.5 MG tablet Take 0.5 mg by mouth at bedtime as needed for anxiety. For sleep      . pravastatin (PRAVACHOL) 40 MG tablet TAKE 1 TABLET AT BEDTIME  90 tablet  0  . predniSONE (DELTASONE) 10 MG tablet Take 4 tabs x 2 days, 3 tabs x 2 days, 2 tabs x 2 days, 1 tab x 2 days then stop  20 tablet  0  . Probiotic Product (PROBIOTIC PO) Take 1 tablet by mouth daily.      . ranitidine (ZANTAC) 150 MG tablet Take 1 tablet (150 mg total) by mouth 2 (two) times daily.  180 tablet  3  . RELION ULTRA THIN PLUS LANCETS MISC As directed        No current  facility-administered medications for this visit.    Allergies Angiotensin receptor blockers; Latex; Penicillins; Shellfish-derived products; Zestril; Spironolactone; Coreg; and Morphine and related  Family History: Family History  Problem Relation Age of Onset  . Coronary artery disease Father     no MI  . Stroke Mother     in 51s  . Diabetes Mother   . Ovarian cancer Sister     twin sibling  . Alcohol abuse Maternal Grandfather   . Hypothyroidism Sister     twin  . Goiter Sister     resected  . COPD Father   . Breast cancer Sister   . Irritable bowel syndrome Sister     Social History: History   Social History  . Marital Status: Married    Spouse Name: N/A    Number of Children: 2  . Years of Education: N/A   Occupational History  . RN-Retired    Social History Main Topics  . Smoking status: Never Smoker   . Smokeless tobacco: Never Used  . Alcohol Use: No  . Drug Use: No  . Sexual Activity: Not on file   Other Topics Concern  . Not on file   Social History Narrative  . No narrative on file    Electrocardiogram:  7/14  SR RBBB inferior T wave changes   Assessment and Plan

## 2013-01-31 NOTE — Assessment & Plan Note (Signed)
Root of all her troubles.  She is a retired Aeronautical engineer of bariatric surgery but I don't think she understands the newer techniques and lapband approach.  Referral number given

## 2013-01-31 NOTE — Patient Instructions (Signed)
Your physician recommends that you schedule a follow-up appointment in: AS NEEDED  Your physician has recommended you make the following change in your medication:  STOP  AMLODIPINE  AND  START LOSARTAN  50 MG  1  EVERY DAY   Your physician has requested that you have a lexiscan myoview. For further information please visit [email or web address] Please follow instruction sheet, as given.   BARIATRIC   CLINIC  [phone]

## 2013-01-31 NOTE — Assessment & Plan Note (Signed)
Should seek 2nd opinion at tertiary facility ? MAST cell issue.  Prednisone makes her BS uncontrolled and weight go up

## 2013-01-31 NOTE — Assessment & Plan Note (Signed)
Contributes to diastolic dysfunction including obesity steroids and DM  Start cozaar 50 mg and d/c amlodipine. No true allergy to ACE inhibitors Discussed at length with patient and her hives have never been worse with ACE and have persisted for 2 years in cyclical fashion despite not being on ACE No history of angioedema

## 2013-01-31 NOTE — Assessment & Plan Note (Signed)
Discussed low carb diet.  Target hemoglobin A1c is 6.5 or less.  Continue current medications.  

## 2013-01-31 NOTE — Assessment & Plan Note (Signed)
Cholesterol is at goal.  Continue current dose of statin and diet Rx.  No myalgias or side effects.  F/U  LFT's in 6 months. No results found for this basename: LDLCALC             

## 2013-01-31 NOTE — Assessment & Plan Note (Signed)
Related to steroids, obesity HTN and DM  Need to r/o CAD with long standing DM and dyspnea ? Anginal equivalent  Lexiscan myovue Add ARB and continue diuretic

## 2013-02-12 ENCOUNTER — Ambulatory Visit (INDEPENDENT_AMBULATORY_CARE_PROVIDER_SITE_OTHER): Payer: Medicare Other | Admitting: Internal Medicine

## 2013-02-12 ENCOUNTER — Encounter: Payer: Self-pay | Admitting: Internal Medicine

## 2013-02-12 ENCOUNTER — Telehealth: Payer: Self-pay | Admitting: Endocrinology

## 2013-02-12 VITALS — BP 189/76 | HR 81 | Temp 98.7°F | Wt 272.0 lb

## 2013-02-12 DIAGNOSIS — H5789 Other specified disorders of eye and adnexa: Secondary | ICD-10-CM

## 2013-02-12 DIAGNOSIS — I10 Essential (primary) hypertension: Secondary | ICD-10-CM

## 2013-02-12 MED ORDER — INSULIN ASPART 100 UNIT/ML ~~LOC~~ SOLN: 100.0000 [IU] | Freq: Three times a day (TID) | SUBCUTANEOUS | Status: DC

## 2013-02-12 NOTE — Telephone Encounter (Signed)
Refilled lantus...ds,cma

## 2013-02-12 NOTE — Progress Notes (Signed)
  Subjective:    Patient ID: Valerie Stevenson, female    DOB: 04-07-1946, 67 y.o.   MRN: [id number]  HPI Acute  visit Left eye started to hurt yesterday, she has noted some clear tearing. Vision remains normal but she has light sensitivity. Does not use contacts, no history of glaucoma or any eye surgery. Additionally, her BP was noted to be elevated, she was a started on losartan 2 weeks ago, not ambulatory BPs since then.  Past Medical History  Diagnosis Date  . Diabetes mellitus      IDDM;Dr Everardo All  . Toxic multinodular goiter     Dr Everardo All  . OSA (obstructive sleep apnea)     CPAP;Dr Clint Young  . Glaucoma   . Anemia     iron deficinecy  . Asthma   . COPD (chronic obstructive pulmonary disease)   . Depression   . GERD (gastroesophageal reflux disease)   . Hyperlipidemia   . Hypertension   . Hyperthyroidism     in context of nodule; Tapazole therapy since 2007  . Skin cancer     basal cell  . Diverticulosis of colon 2009  . Urticaria, idiopathic   . Renal disease     insufficiency   -- saw Nephrologist  in [place]..hasn't seen one in 2-3 yrs  . Arthritis   . Eczema   . Anxiety   . IBS (irritable bowel syndrome)    Past Surgical History  Procedure Laterality Date  . Total abdominal hysterectomy w/ bilateral salpingoophorectomy      sister with ovarian cancer  . Replacement total knee Left 2003    left ; Dr Sullivan Lone; [place]  . Total knee arthroplasty  10/19/2011    Procedure: TOTAL KNEE ARTHROPLASTY;  Surgeon: Loreta Ave, MD;  Location: [facility] OR;  Service: Orthopedics;  Laterality: Right;  DR MURPHY WANTS 90 MINUTES FOR THIS CASE   . Colonoscopy  2006    Next Due date 2016      Review of Systems Her right upper eyelid was noted to be swollen, she has that problem on and off; She started losartan 2 weeks ago and since then  Her chronic hives and eye lid swelling on-off are  at baseline. Denies any lip or tongue swelling.      Objective:   Physical Exam  Constitutional: She appears well-developed and well-nourished. No distress.  HENT:  Head: Normocephalic and atraumatic.  Eyes:  EOMI, PERLA Right upper eyelid slightly swelling but not red. Left eye: + Conjunctival redness, mild clear discharge, cornea is clear except for a 1 mm opacification at around 12:00.   Skin: She is not diaphoretic.   BP 189/76  Pulse 81  Temp(Src) 98.7 F (37.1 C)  Wt 272 lb (123.378 kg)  BMI 45.26 kg/m2  SpO2 97%        Assessment & Plan:  Red eye, Suspect a cornea infection versus a scratch versus other issues. Definitely need to see the eye doctor, will arrange a visit for today, see instructions. Until then if she gets a lot worse: go to the ER  Hypertension, not well-controlled, recommend to check her BP daily for 3-4 days and call  cardiology with results.

## 2013-02-12 NOTE — Patient Instructions (Addendum)
You have an appointment to see Dr. Weston Anna  at 3 PM today [address], [place], [place] [address] [phone]  Check the  blood pressure daily  be sure it is between 110/60 and 140/85. Ideal blood pressure is 120/80.  Call your cardiologist with readings in 3-4 days to get further advise

## 2013-02-14 ENCOUNTER — Other Ambulatory Visit: Payer: Self-pay | Admitting: Endocrinology

## 2013-02-14 ENCOUNTER — Telehealth: Payer: Self-pay | Admitting: Cardiovascular Disease

## 2013-02-14 MED ORDER — INSULIN ASPART 100 UNIT/ML ~~LOC~~ SOLN: SUBCUTANEOUS | Status: DC

## 2013-02-14 NOTE — Telephone Encounter (Signed)
Spoke with pt who reports she saw Dr. Drue Novel on 10/21 and blood pressure was 186/79. She was told to monitor for next 4-5 days and call cardiology if blood pressure remains elevated.  Pt reports blood pressure yesterday evening was 186/62. Today was 208/73 prior to AM meds. She rechecked 2 hours after taking AM meds and it is now 188/62.  Heart rate in the 50's. No readings prior to 10/21.  Pt reports she is going to start on heart healthy diet next week to help with weight loss and hopefully blood pressure.  Pt saw Dr. Eden Emms on 10/9 and amlodipine was stopped and Cozaar 50 mg daily started.  Pt reports she is taking Cozaar and also taking bystolic and clonidine as listed on med list.  Will review blood pressure readings with DOD as Dr. Eden Emms out of office until next week.

## 2013-02-14 NOTE — Telephone Encounter (Signed)
New message    Dr changed rx to losartin---Since on rx bp is running high.   This am it was 208/73 before she took her rx

## 2013-02-14 NOTE — Telephone Encounter (Signed)
Reviewed with Dr. Antoine Poche and no changes needed at this time and to forward to Dr. Eden Emms.  I spoke with pt and told her to continue same medications and to continue to monitor blood pressure and call us early next week with readings.

## 2013-02-19 ENCOUNTER — Encounter (HOSPITAL_COMMUNITY): Payer: Medicare Other

## 2013-02-19 ENCOUNTER — Telehealth: Payer: Self-pay

## 2013-02-19 NOTE — Telephone Encounter (Signed)
Called and spoke with pt and pt is aware.  Pt verbalized understanding.  

## 2013-02-19 NOTE — Telephone Encounter (Signed)
Left a message for return call.  

## 2013-02-19 NOTE — Telephone Encounter (Signed)
Please reduce your insulin to 1/2 of the amounts you take now.

## 2013-02-19 NOTE — Telephone Encounter (Signed)
Pt called the office to make aware that her BS has been running low.  Per pt it has been in the 70-80 and low 100's.  Pt states she is going to start a new low carb-low fat diet this week that will not include a lot of carbs. This will be for the next 6 weeks.  Pt is concerned because her BS is already running low.  Pt was told that ppl have come off of their insulin after doing this diet.  For example: at breakfast pt will have toast and eggs and 20 units of inuslin; for lunch and dinner pt will have one meat and vegetables.  Pt is concerned about this and feels her insulin needs to be adjusted.  Asked pt when BS are low and pt states it varies sometimes in the morning and sometimes at night. Pls advise.

## 2013-02-22 NOTE — Telephone Encounter (Signed)
Follow Up:  Pt states about 3 weeks ago her BP meds were changed and ever since her systolic number has been really high. Pt states her BP has been 212/73 pulse 64.. 219/70 pulse 57.. Both of those BP's were from yesterday. Pt states today it was 217/72 pulse was 58. Pt states she has had a bad headache.Marland KitchenMarland Kitchen

## 2013-02-22 NOTE — Telephone Encounter (Signed)
PT  NOTIFIED ./CY 

## 2013-02-22 NOTE — Telephone Encounter (Signed)
Increase losartan to 100 mg

## 2013-02-22 NOTE — Telephone Encounter (Signed)
WILL FORWARD TO DR NISHAN FOR  REVIEW./CY 

## 2013-02-26 ENCOUNTER — Ambulatory Visit (HOSPITAL_COMMUNITY): Payer: Medicare Other | Attending: Cardiology | Admitting: Radiology

## 2013-02-26 VITALS — BP 141/51 | Ht 65.0 in | Wt 265.0 lb

## 2013-02-26 DIAGNOSIS — R0989 Other specified symptoms and signs involving the circulatory and respiratory systems: Secondary | ICD-10-CM | POA: Insufficient documentation

## 2013-02-26 DIAGNOSIS — E119 Type 2 diabetes mellitus without complications: Secondary | ICD-10-CM | POA: Insufficient documentation

## 2013-02-26 DIAGNOSIS — E785 Hyperlipidemia, unspecified: Secondary | ICD-10-CM | POA: Insufficient documentation

## 2013-02-26 DIAGNOSIS — E669 Obesity, unspecified: Secondary | ICD-10-CM | POA: Insufficient documentation

## 2013-02-26 DIAGNOSIS — I10 Essential (primary) hypertension: Secondary | ICD-10-CM | POA: Insufficient documentation

## 2013-02-26 DIAGNOSIS — R0609 Other forms of dyspnea: Secondary | ICD-10-CM | POA: Insufficient documentation

## 2013-02-26 DIAGNOSIS — Z794 Long term (current) use of insulin: Secondary | ICD-10-CM | POA: Insufficient documentation

## 2013-02-26 DIAGNOSIS — R002 Palpitations: Secondary | ICD-10-CM | POA: Insufficient documentation

## 2013-02-26 DIAGNOSIS — R0602 Shortness of breath: Secondary | ICD-10-CM

## 2013-02-26 DIAGNOSIS — Z8249 Family history of ischemic heart disease and other diseases of the circulatory system: Secondary | ICD-10-CM | POA: Insufficient documentation

## 2013-02-26 MED ORDER — TECHNETIUM TC 99M SESTAMIBI GENERIC - CARDIOLITE
33.0000 | Freq: Once | INTRAVENOUS | Status: AC | PRN
  Administered 2013-02-26: 33 via INTRAVENOUS

## 2013-02-26 MED ORDER — REGADENOSON 0.4 MG/5ML IV SOLN
0.4000 mg | Freq: Once | INTRAVENOUS | Status: AC
  Administered 2013-02-26: 0.4 mg via INTRAVENOUS

## 2013-02-26 NOTE — Progress Notes (Signed)
[facility] SITE 3 NUCLEAR MED [address] [place], [place] [address] [phone]    Cardiology Nuclear Med Study  Valerie Stevenson is a 68 y.o. female     MRN : [id number]     DOB: 21-May-1945  Procedure Date: 02/26/2013  Nuclear Med Background Indication for Stress Test:  Evaluation for Ischemia History:  13/2012 MPI: [place] [facility], 5/14 ECHO: EF: 60% Cardiac Risk Factors: Family History - CAD, Hypertension, IDDM Type 2, Lipids and Obesity  Symptoms:  DOE, Palpitations and SOB   Nuclear Pre-Procedure Caffeine/Decaff Intake:  None NPO After: 7:00pm   Lungs:  clear O2 Sat: 95% on room air. IV 0.9% NS with Angio Cath:  22g  IV Site: R Antecubital  IV Started by:  Cathlyn Parsons, RN  Chest Size (in): 48 Cup Size: DD  Height: 5\' 5"  (1.651 m)  Weight:  265 lb (120.203 kg)  BMI:  Body mass index is 44.1 kg/(m^2). Tech Comments:  No Bystolic x 24 hrs    Nuclear Med Study 1 or 2 day study: 2 day  Stress Test Type:  Lexiscan  Reading MD: Cassell Clement, MD  Order Authorizing Provider:  Burna Cash  Resting Radionuclide: Technetium 65m Sestamibi  Resting Radionuclide Dose: 33.0 mCi on 02/27/13   Stress Radionuclide:  Technetium 93m Sestamibi  Stress Radionuclide Dose: 33.0 mCi on 02/26/13           Stress Protocol Rest HR: 47 Stress HR: 78  Rest BP: 141/51 Stress BP: 200/81  Exercise Time (min): n/a METS: n/a   Predicted Max HR: 153 bpm % Max HR: 50.98 bpm Rate Pressure Product: 69629   Dose of Adenosine (mg):  n/a Dose of Lexiscan: 0.4 mg  Dose of Atropine (mg): n/a Dose of Dobutamine: n/a mcg/kg/min (at max HR)  Stress Test Technologist: Milana Na, EMT-P  Nuclear Technologist:  Domenic Polite, CNMT     Rest Procedure:  Myocardial perfusion imaging was performed at rest 45 minutes following the intravenous administration of Technetium 67m Sestamibi. Rest ECG: NSR-RBBB  Stress Procedure:  The patient received IV Lexiscan 0.4 mg over  15-seconds.  Technetium 2m Sestamibi injected at 30-seconds. This patient had a headache and felt weird with the Lexiscan injection. Quantitative spect images were obtained after a 45 minute delay. Stress ECG: No significant change from baseline ECG  QPS Raw Data Images:  Normal; no motion artifact; normal heart/lung ratio. Stress Images:  Normal homogeneous uptake in all areas of the myocardium. Rest Images:  Normal homogeneous uptake in all areas of the myocardium. Subtraction (SDS):  No evidence of ischemia. Transient Ischemic Dilatation (Normal <1.22):  0.97 Lung/Heart Ratio (Normal <0.45):  0.28  Quantitative Gated Spect Images QGS EDV:  103 ml QGS ESV:  27 ml  Impression Exercise Capacity:  Lexiscan with no exercise. BP Response:  Normal blood pressure response. Clinical Symptoms:  No chest pain. ECG Impression:  No significant ST segment change suggestive of ischemia. Comparison with Prior Nuclear Study: No previous nuclear study performed  Overall Impression:  Normal stress nuclear study.  LV Ejection Fraction: 74%.  LV Wall Motion:  NL LV Function; NL Wall Motion  [employer]

## 2013-02-27 ENCOUNTER — Ambulatory Visit (HOSPITAL_COMMUNITY): Payer: Medicare Other | Attending: Cardiovascular Disease

## 2013-02-27 DIAGNOSIS — R0989 Other specified symptoms and signs involving the circulatory and respiratory systems: Secondary | ICD-10-CM

## 2013-02-27 MED ORDER — TECHNETIUM TC 99M SESTAMIBI GENERIC - CARDIOLITE
33.0000 | Freq: Once | INTRAVENOUS | Status: AC | PRN
  Administered 2013-02-27: 33 via INTRAVENOUS

## 2013-03-04 ENCOUNTER — Telehealth: Payer: Self-pay | Admitting: Cardiovascular Disease

## 2013-03-04 NOTE — Telephone Encounter (Signed)
New Problem:  Pt states she is returning Christine's call. Please advise

## 2013-03-04 NOTE — Telephone Encounter (Signed)
PT  NOTIFIED OF NORMAL MYOVIEW  PER  DR NISHAN./CY

## 2013-03-05 ENCOUNTER — Telehealth: Payer: Self-pay | Admitting: *Deleted

## 2013-03-05 NOTE — Telephone Encounter (Signed)
Please reduce lantus to 50 units daily Also, please reduce the novolog to 20 units with breakfast, 10 units with lunch, and 25 units with supper.

## 2013-03-05 NOTE — Telephone Encounter (Signed)
Patient left vm stating that her BS has lowered since she began her diet. 130, 120, 92, 65, 76, 106. Patient wants to know if her insulin needs to be dropped. Patient stated that she did not take her insulin this morning and her BS read 92 am & 65 at lunch. Please advise?

## 2013-03-06 ENCOUNTER — Telehealth: Payer: Self-pay | Admitting: *Deleted

## 2013-03-06 NOTE — Telephone Encounter (Signed)
Ok, i have reviewed the record.  When your blood sugar was low, we reduced to 1/2 of novolog 35-25-40 units, and 1/2 of lantus 60 units at bedtime. Because it is still low, please reduce lantus to 20 units at bedtime, and novolog to 15-10-15 units. Do these numbers sound accurate?  Please let me know if not.

## 2013-03-06 NOTE — Telephone Encounter (Signed)
Pt phoned in, stating that her blood glucose is continuing to drop with most recent insulin instructions.  Per Dr. Everardo All, he will contact her today when he completes seeing patients.  Pt states best # to reach her is [phone]

## 2013-03-06 NOTE — Telephone Encounter (Signed)
i called pt today please reduce lantus to 10 units at bedtime, and novolog to 3 times a day (just before each meal) 01-27-09 units.

## 2013-03-06 NOTE — Telephone Encounter (Signed)
Dr Everardo All  Ms Valerie Stevenson reduced her Lantus to 30 some time ago and her novalog is 10 at Breakfast and 13 at lunch and 20 at supper. She is upset because she doesn't feel like you know what dosage she is supposed to be taking because you haad alre3ady reduced these doesage prior to now she wants to talk to you.please call her

## 2013-03-08 ENCOUNTER — Other Ambulatory Visit: Payer: Self-pay | Admitting: Internal Medicine

## 2013-03-08 NOTE — Telephone Encounter (Signed)
Called pt and lvm advising her reviewed the record. When your blood sugar was low, we reduced to 1/2 of novolog 35-25-40 units, and 1/2 of lantus 60 units at bedtime.  Because it is still low, please reduce lantus to 20 units at bedtime, and novolog to 15-10-15 units.  Please call us back and let us know how your numbers are.

## 2013-03-08 NOTE — Telephone Encounter (Signed)
Clonidine refilled.

## 2013-03-18 ENCOUNTER — Other Ambulatory Visit: Payer: Self-pay | Admitting: Internal Medicine

## 2013-03-18 NOTE — Telephone Encounter (Signed)
Bystolic refilled per protocol 

## 2013-03-21 ENCOUNTER — Other Ambulatory Visit: Payer: Self-pay | Admitting: Internal Medicine

## 2013-03-22 NOTE — Telephone Encounter (Signed)
Amlodipine refilled per protocol 

## 2013-04-15 ENCOUNTER — Other Ambulatory Visit: Payer: Self-pay | Admitting: Obstetrics and Gynecology

## 2013-04-15 DIAGNOSIS — R921 Mammographic calcification found on diagnostic imaging of breast: Secondary | ICD-10-CM

## 2013-04-24 ENCOUNTER — Other Ambulatory Visit: Payer: Self-pay | Admitting: Internal Medicine

## 2013-04-24 NOTE — Telephone Encounter (Signed)
Pravastatin refilled per protocol. JG//CMA 

## 2013-04-29 ENCOUNTER — Ambulatory Visit (INDEPENDENT_AMBULATORY_CARE_PROVIDER_SITE_OTHER): Payer: Medicare Other | Admitting: Endocrinology

## 2013-04-29 ENCOUNTER — Encounter: Payer: Self-pay | Admitting: Endocrinology

## 2013-04-29 VITALS — BP 138/74 | HR 52 | Temp 98.0°F | Ht 65.0 in | Wt 262.0 lb

## 2013-04-29 DIAGNOSIS — IMO0002 Reserved for concepts with insufficient information to code with codable children: Secondary | ICD-10-CM

## 2013-04-29 DIAGNOSIS — E1129 Type 2 diabetes mellitus with other diabetic kidney complication: Secondary | ICD-10-CM

## 2013-04-29 DIAGNOSIS — E1165 Type 2 diabetes mellitus with hyperglycemia: Principal | ICD-10-CM

## 2013-04-29 DIAGNOSIS — N058 Unspecified nephritic syndrome with other morphologic changes: Secondary | ICD-10-CM

## 2013-04-29 NOTE — Progress Notes (Signed)
Subjective:    Patient ID: Valerie Stevenson, female    DOB: [birth year], 68 y.o.   MRN: [id number]  HPI Pt returns for f/u of insulin-requiring DM (dx'ed with GDM in 1981, and DM outside of pregnancy in approx 1986; she has mild neuropathy of the lower extremities, and associated retinopathy and renal insufficiency; she has never had severe hypoglycemia or DKA).   she brings a record of her cbg's,  checked mostly in am, which i have reviewed today. Almost all are in the 100's.  There is no trend throughout the day.  pt states she feels well in general. Past Medical History  Diagnosis Date  . Diabetes mellitus      IDDM;Dr Loanne Drilling  . Toxic multinodular goiter     Dr Loanne Drilling  . OSA (obstructive sleep apnea)     CPAP;Dr Clint Young  . Glaucoma   . Anemia     iron deficinecy  . Asthma   . COPD (chronic obstructive pulmonary disease)   . Depression   . GERD (gastroesophageal reflux disease)   . Hyperlipidemia   . Hypertension   . Hyperthyroidism     in context of nodule; Tapazole therapy since 2007  . Skin cancer     basal cell  . Diverticulosis of colon 2009  . Urticaria, idiopathic   . Renal disease     insufficiency   -- saw Nephrologist  in [place]..hasn't seen one in 2-3 yrs  . Arthritis   . Eczema   . Anxiety   . IBS (irritable bowel syndrome)     Past Surgical History  Procedure Laterality Date  . Total abdominal hysterectomy w/ bilateral salpingoophorectomy      sister with ovarian cancer  . Replacement total knee Left 2003    left ; Dr Rosanna Randy; [place]  . Total knee arthroplasty  10/19/2011    Procedure: TOTAL KNEE ARTHROPLASTY;  Surgeon: Ninetta Lights, MD;  Location: [facility];  Service: Orthopedics;  Laterality: Right;  DR MURPHY WANTS 90 MINUTES FOR THIS CASE   . Colonoscopy  2006    Next Due date 2016    History   Social History  . Marital Status: Married    Spouse Name: N/A    Number of Children: 2  . Years of Education: N/A   Occupational  History  . RN-Retired    Social History Main Topics  . Smoking status: Never Smoker   . Smokeless tobacco: Never Used  . Alcohol Use: No  . Drug Use: No  . Sexual Activity: Not on file   Other Topics Concern  . Not on file   Social History Narrative  . No narrative on file    Current Outpatient Prescriptions on File Prior to Visit  Medication Sig Dispense Refill  . albuterol (PROVENTIL HFA;VENTOLIN HFA) 108 (90 BASE) MCG/ACT inhaler Inhale 2 puffs into the lungs every 4 (four) hours as needed for wheezing.  1 Inhaler  2  . amLODipine (NORVASC) 5 MG tablet TAKE 1 TABLET TWICE A DAY  180 tablet  1  . aspirin EC 81 MG tablet Take 81 mg by mouth daily with supper.      . B-D UF III MINI PEN NEEDLES 31G X 5 MM MISC As directed      . budesonide-formoterol (SYMBICORT) 80-4.5 MCG/ACT inhaler Inhale 2 puffs into the lungs 2 (two) times daily as needed.       Marland Kitchen BYSTOLIC 5 MG tablet TAKE 3 TABLETS DAILY  270 tablet  1  . Cetirizine HCl (ZYRTEC ALLERGY PO) Take 1 tablet by mouth 2 (two) times daily.      . cloNIDine (CATAPRES) 0.1 MG tablet TAKE 1 TABLET TWICE A DAY  180 tablet  1  . EPINEPHrine (EPIPEN 2-PAK) 0.3 mg/0.3 mL DEVI Inject 0.3 mLs (0.3 mg total) into the muscle once.  1 Device  11  . Fexofenadine HCl (ALLEGRA PO) Take 1 tablet by mouth 2 (two) times daily.      . furosemide (LASIX) 40 MG tablet Take 1 tablet (40 mg total) by mouth daily as needed. For swelling  90 tablet  3  . guaiFENesin-codeine 100-10 MG/5ML syrup Take 10 mLs by mouth 3 (three) times daily.  240 mL  0  . Insulin Pen Needle (RELION PEN NEEDLE 31G/8MM) 31G X 8 MM MISC USE AS DIRECTED THREE TIMES DAILY  300 each  2  . Insulin Syringe-Needle U-100 (RELION INSULIN SYRINGE 1ML/31G) 31G X 5/16" 1 ML MISC USE AS DIRECTED ONCE DAILY.  300 each  2  . LORazepam (ATIVAN) 0.5 MG tablet Take 0.5 mg by mouth at bedtime as needed for anxiety. For sleep      . losartan (COZAAR) 50 MG tablet Take 1 tablet (50 mg total) by mouth  daily.  30 tablet  1  . pravastatin (PRAVACHOL) 40 MG tablet TAKE 1 TABLET AT BEDTIME  90 tablet  1  . ranitidine (ZANTAC) 150 MG tablet Take 1 tablet (150 mg total) by mouth 2 (two) times daily.  180 tablet  3  . RELION ULTRA THIN PLUS LANCETS MISC As directed        No current facility-administered medications on file prior to visit.    Allergies  Allergen Reactions  . Angiotensin Receptor Blockers Hives    Hives ??  . Latex     rash  . Penicillins Hives    Tolerates Rocephin 08/22/12 - Thuy  . Shellfish-Derived Products Hives and Rash  . Zestril [Lisinopril] Hives  . Spironolactone     Intermittent renal insufficiency  . Coreg [Carvedilol] Hives  . Morphine And Related Itching    Family History  Problem Relation Age of Onset  . Coronary artery disease Father     no MI  . Stroke Mother     in 27s  . Diabetes Mother   . Ovarian cancer Sister     twin sibling  . Alcohol abuse Maternal Grandfather   . Hypothyroidism Sister     twin  . Goiter Sister     resected  . COPD Father   . Breast cancer Sister   . Irritable bowel syndrome Sister     BP 138/74  Pulse 52  Temp(Src) 98 F (36.7 C) (Oral)  Ht 5\' 5"  (1.651 m)  Wt 262 lb (118.842 kg)  BMI 43.60 kg/m2  SpO2 95%  Review of Systems denies hypoglycemia.  she has lost weight.     Objective:   Physical Exam VITAL SIGNS:  See vs page. GENERAL: no distress.    outside test results are reviewed: A1c=6.3    Assessment & Plan:  DM: well-controlled.  This insulin regimen was chosen from multiple options, as it best matches her insulin to her changing requirements throughout the day.  The benefits of glycemic control must be weighed against the risks of hypoglycemia.  Weight loss: this helps DM control. Urticaria: being off the prednisone helps DM control also.

## 2013-04-29 NOTE — Patient Instructions (Addendum)
Please come back for a follow-up appointment in 3 months.   Please continue the same insulins for now. check your blood sugar 2 times a day.  vary the time of day when you check, between before the 3 meals, and at bedtime.  also check if you have symptoms of your blood sugar being too high or too low.  please keep a record of the readings and bring it to your next appointment here.  please call us sooner if your blood sugar goes below 70, or if it stays over 200.

## 2013-04-30 ENCOUNTER — Ambulatory Visit
Admission: RE | Admit: 2013-04-30 | Discharge: 2013-04-30 | Disposition: A | Discharge status: Home / Self Care | Payer: Medicare Other | Source: Ambulatory Visit | Attending: Obstetrics and Gynecology | Admitting: Obstetrics and Gynecology

## 2013-04-30 DIAGNOSIS — R921 Mammographic calcification found on diagnostic imaging of breast: Secondary | ICD-10-CM

## 2013-06-10 ENCOUNTER — Telehealth: Payer: Self-pay

## 2013-06-10 NOTE — Telephone Encounter (Signed)
Pt.notified

## 2013-06-10 NOTE — Telephone Encounter (Signed)
Increase lantus to 35 units qhs. Please call if this does not help.

## 2013-06-10 NOTE — Telephone Encounter (Signed)
Pt called stating that the last 3-5 days her sugars in the morning has been in the 200s. Pt confirmed that she is taking 20 units of Novolog and 30 units of Lantus in the evenings. Wanted to know if adjustments should be made? Thanks!

## 2013-06-21 ENCOUNTER — Ambulatory Visit: Payer: Medicare Other | Admitting: Internal Medicine

## 2013-06-24 ENCOUNTER — Telehealth: Payer: Self-pay

## 2013-06-24 NOTE — Telephone Encounter (Signed)
PT called stating that her blood sugars have been running in the 190's to 200's in the morning and 180's to 190's throughout the day. Pt confirms that she is taking 15-15-20 of Novolog and 35 units of Lantus.  Please advise, Thanks!

## 2013-06-24 NOTE — Telephone Encounter (Signed)
Pt informed

## 2013-06-24 NOTE — Telephone Encounter (Signed)
Please increase lantus to 35 units qhs.   Please increase novolog to 3 times a day (just before each meal) 20-20-25 units.   i'll see you next time.

## 2013-07-08 ENCOUNTER — Other Ambulatory Visit: Payer: Self-pay | Admitting: Internal Medicine

## 2013-07-08 ENCOUNTER — Telehealth: Payer: Self-pay

## 2013-07-08 NOTE — Telephone Encounter (Signed)
Please increase lantus to 40 units at bedtime

## 2013-07-08 NOTE — Telephone Encounter (Signed)
The patient called and is hoping to get some information on how to control her diabetes in the morning.  She was offered and office visit, but stated she would rather speak with the [facility] first.   She stated the cell number was the best number to reach her.

## 2013-07-08 NOTE — Telephone Encounter (Signed)
Pt informed

## 2013-07-08 NOTE — Telephone Encounter (Signed)
Called pt and she states that sugars have been in the 180's to low 200's in the morning. Pt confirms that she is taking 35 units of Lantus and 20-20-25 Novolog. Wanted to know if insulin dosage could be adjusted? Please advise, Thanks!

## 2013-07-08 NOTE — Telephone Encounter (Signed)
Requested call back.  

## 2013-07-23 ENCOUNTER — Encounter: Payer: Self-pay | Admitting: Internal Medicine

## 2013-07-23 ENCOUNTER — Ambulatory Visit (INDEPENDENT_AMBULATORY_CARE_PROVIDER_SITE_OTHER): Payer: Medicare Other | Admitting: Internal Medicine

## 2013-07-23 VITALS — BP 114/62 | HR 65 | Ht 65.0 in | Wt 271.8 lb

## 2013-07-23 DIAGNOSIS — J45909 Unspecified asthma, uncomplicated: Secondary | ICD-10-CM

## 2013-07-23 DIAGNOSIS — L501 Idiopathic urticaria: Secondary | ICD-10-CM

## 2013-07-23 DIAGNOSIS — G4733 Obstructive sleep apnea (adult) (pediatric): Secondary | ICD-10-CM

## 2013-07-23 NOTE — Patient Instructions (Addendum)
Ok to use the albuterol rescue inhaler occasionally as needed.  The Symbicort maintenance inhaler works best if used at least several days in a row as a Buyer, retail.  We can continue CPAP 10/ [facility]  Order- DME [facility]   Replacement CPAP mask of choice, hose and supplies    Dx OSA

## 2013-07-23 NOTE — Progress Notes (Signed)
Patient ID: Valerie Stevenson, female    DOB: 1945/05/27, 68 y.o.   MRN: [id number]  HPI 09/02/10- 54 yoF w/ hx urticaria complicated by asthma/ COPD, food allergy to shellfish, RAI for thyroid Last here 08/17/10 for initial visit for Dr Linna Darner, with onset of hives 3 weeks previously. Discussed chronic lisinopril. Better with hives but still getting some especially as she gets up in the morning- shower makes them more visible. She has been taking Allegra 180, with Zantac 300mg  bid. She remains itchy. Had tried doxepin 10- no help and didn't like it. Dr Linna Darner gave Ativan.  Allergy profile-  IgE 296, but no specific elevations, WBC 16,600; No eos. No overt infection.  12/15/10- 35 yoF w/ hx urticaria complicated by asthma/ COPD, food allergy to shellfish, RAI for thyroid Continues to complain of itching with visible hives every day. Dr Loanne Drilling felt her thyroid hormone levels were not the problem, but the underlying cause of her thyroid disease may be related. She has tried zantac now at 150 twice daily, with Singulair. She came off allegra, which wasn't helping. Doxepin didn't help and not well tolerated. She has used Ativan to reduce irritability but it makes her too sleepy. I explained that urticaria may not be allergic, may be related to any of her medications, and may be related to her thyroid or diabetes disorders. There has not been evidence of malignancy. CT chest was negative except for thyroid nodule. Sedimentation rate was elevated at 57. ANA was negative. IgE was elevated at 266.1 but food profile was negative. Specific seafood profile showed mild elevations for crab and shrimp. IgG food profile results are being sought.  12/29/20-64 yoF w/ hx urticaria complicated by asthma/ COPD, food allergy to shellfish, RAI for thyroid Here today for new problem- concern of sleep disorder Documented hx OSA on CPAP. Needs to establish here for update Rx for machine and supplies. Her current CPAP at 10 is used  all night, every night. She had a sleep study in [place] to establish the diagnosis. Her current machine is 80 or 69 years old and needs to be replaced. She has not been using a humidifier. Nasal pillows mask. She is satisfied with her treatment and only asks that we help get her replacement machine and maintain followup through this office. Sleep hygiene: Bedtime between 9 and 10 PM, sleep latency 10-15 minutes, waking 2 or 3 times each night for finally up between 7:30 and 8 AM. Weight has gone up 25 pounds in the last 2 years. She feels restless at night and will get up to sit on the side of the bed. This is mostly do to her ongoing urticaria. No history of ENT surgery.  Problem of urticaria is ongoing. She is pending allergy evaluation at [facility]. I suspect relationship to her thyroid disease. Other medical problems include kidney disease elevated cholesterol diabetes palpitations and hypertension. There is no history of heart or lung disease otherwise. Lab- IgG food allergy panel showed elevations for milk and egg white. She understands this information is not firmly validated, and is of value only to raise awareness. She will try to avoid these for a month and see if that affects her urticaria. She does still believe that some food is the basis for her hives, although we have discussed other etiologies.   06/21/11- 60 yoF w/ hx urticaria complicated by asthma/ COPD, food allergy to shellfish, RAI for thyroid, OSA/ CPAP Likes her new CPAP machine which is smaller.  Wears it all night every night and can't sleep without it. Using nasal pillows mask/Advanced/10 cwp. Has not had urticaria in the past month, off meds.. I had sent her to [facility] where evaluation there put her on similar medication-Allegra, Singulair, Zantac. He told her basically the same things that I have told her.  06/20/12-  66 yoF w/ hx urticaria complicated by asthma/ COPD, food allergy to shellfish, RAI for thyroid, OSA/  CPAP FOLLOWS FOR: wears CPAP every night for about 8 hours; has not worn in past 2 nights as she has stayed at the hospital with her sister in law due to surgery; would also like to know what could be causing her hives/itching--questionable reaction to new medicine (coreg) Little sleep for last 2 nights, helping family after knee surgery. Has had to miss CPAP 10/ Advanced for this.Very tired. Hives/ itching flared again after restarted on Coreg 3-4 weeks ago, which seemed to trigger before. We and [facility] had not found specific trigger 2 years ago. Dermatologist told her she has eczema- take Aveeno.   09/25/12-  58 yoF never smoker w/ hx urticaria complicated by asthma/ COPD/ abnl CT, food allergy to shellfish, RAI for thyroid, OSA/ CPAP, DM,  [facility] CT results with patient as well. Acute Hosp 4/27-08/26/12 for CAP, multilobar, after 2 weeks malaise. Now back to baseline. Less SOB, no cough or phlegm, off ABX. No more urticaria, but has continued allegra/ zyrtec/ singulair. We discussed weaning off. CPAP 10/ Advanced remains good, al night, every night. CT chest in hosp abnormal- reviewed with her today. Denies any prior hx chest wall injury, pneumonia or asbestos exposure or any aspiration or syncope to explain imaging.   CT 09/20/12 IMPRESSION:  1. Improved right-sided aeration, with only mild left upper lobe  scarring/atelectasis and minimal left lower lobe reticular nodular  opacity remaining.  2. Slightly decreased small left pleural effusion.  3. Borderline right paratracheal adenopathy is unchanged and  favored to be reactive.  4. Right-sided pleural plaque and calcification. Favored to be  related to prior right empyema or hemothorax. Asbestos related  pleural disease is felt less likely, given absence of left-sided  abnormality.  5. Cardiomegaly and advanced coronary artery atherosclerosis.  6. Right lung pleural based nodules which measure up to 6 mm If   the patient is at high risk for bronchogenic carcinoma, follow-up  chest CT at 6-12 months is recommended. If the patient is at low  risk for bronchogenic carcinoma, follow-up chest CT at 12 months is  recommended. This recommendation follows the consensus statement:  "Guidelines for Management of Small Pulmonary Nodules Detected on  CT Scans: A Statement from the [place]" as published in  Radiology 2005; 237:395-400. Online at:  https://www.arnold.com/.  Original Report Authenticated By: Abigail Miyamoto, M.D.  12/26/12- 58 yoF never smoker w/ hx urticaria complicated by asthma/ COPD/ abnl CT/ nodules/ plaque, food allergy to shellfish, RAI for thyroid, OSA/ CPAP, DM,  Free of hives since spring, until flare again one week ago. Now hands itch. She associates this with a sense of "knots" under the skin at hairline back of neck. She was evaluated for hypertension with chest x-ray showing CE and vascular congestion. Now pending cardiology evaluation. CPAP 10/Advanced with good compliance and control CXR 10/31/12 IMPRESSION:  1. No acute cardiopulmonary disease.  2. Stable cardiomegaly and pulmonary vascular congestion.  3. Interval resolution of linear atelectasis from the superior  segment of the left lower lobe.  Original Report Authenticated  By: Jacqulynn Cadet, M.D.  07/23/13- 76 yoF never smoker w/ hx urticaria, OSA/ CPAP  complicated by asthma/ COPD/ abnl CT/ nodules/ plaque, food allergy to shellfish, RAI for thyroid,  DM,  FOLLOWS FOR: wears CPAP10/ Advanced every night for about 9 hours; DME is [facility]. Sleeping well. Download in October confirmed good compliance and control. Occasional need for rescue inhaler. Uses Symbicort for intervals when needed. Saw  allergist Dr Harold Hedge for urticaria with labs drawn. They discussed Xolair for chronic hives.  Review of Systems- see HPI Constitutional:   No weight loss, night sweats,  Fevers, chills,  +fatigue, lassitude. HEENT:   No headaches,  Difficulty swallowing,  Tooth/dental problems,  Sore throat,                No sneezing,, ear ache, nasal congestion, post nasal drip,  CV:  No chest pain,  Orthopnea, PND, swelling in lower extremities, anasarca, dizziness, palpitations GI  No heartburn, indigestion, abdominal pain, nausea, vomiting,  Resp: + shortness of breath with exertion or at rest.  No excess mucus, no productive cough,  No               non-productive cough,  No coughing up of blood.  No change in color of mucus.  No wheezing.  Skin:HPI and prior visits. GU: . MS:  No joint pain or swelling.  No decreased range of motion.  No back pain. Psych:  No change in mood or affect. No depression or anxiety.  No memory loss.  Objective:   Physical Exam General- Alert, Oriented, Affect-appropriate, Distress- none acute  Obese,  Skin- +dry skin w/o obvious hives.   Lymphadenopathy- none Head- atraumatic            Eyes- Gross vision intact, PERRLA, conjunctivae clear secretions            Ears- Hearing, canals normal            Nose- Clear, no-Septal dev, mucus, polyps, erosion, perforation             Throat- Mallampati III , mucosa clear , drainage- none, tonsils- atrophic Neck- flexible , trachea midline, no stridor , thyroid nl, carotid no bruit Chest - symmetrical excursion , unlabored           Heart/CV- RRR , no murmur , no gallop  , no rub, nl s1 s2                           - JVD- none , edema- none, stasis changes- none, varices- none           Lung- clear to P&A, wheeze- none, cough- none , dullness-none, rub- none           Chest wall-  Abd- Br/ Gen/ Rectal- Not done, not indicated Extrem- cyanosis- none, clubbing, none, atrophy- none, strength- nl Neuro- grossly intact to observation except chronic droop right corner of mouth

## 2013-07-30 ENCOUNTER — Other Ambulatory Visit: Payer: Self-pay | Admitting: Internal Medicine

## 2013-07-30 NOTE — Telephone Encounter (Signed)
Rx sent to the pharmacy by e-script.  Pt needs complete physical and fasting labs.//AB/CMA 

## 2013-08-04 ENCOUNTER — Other Ambulatory Visit: Payer: Self-pay | Admitting: Endocrinology

## 2013-08-06 ENCOUNTER — Ambulatory Visit: Payer: Medicare Other | Admitting: Endocrinology

## 2013-08-09 ENCOUNTER — Other Ambulatory Visit: Payer: Self-pay | Admitting: Internal Medicine

## 2013-08-13 ENCOUNTER — Other Ambulatory Visit: Payer: Self-pay | Admitting: Internal Medicine

## 2013-08-13 ENCOUNTER — Encounter: Payer: Self-pay | Admitting: Endocrinology

## 2013-08-13 ENCOUNTER — Ambulatory Visit (INDEPENDENT_AMBULATORY_CARE_PROVIDER_SITE_OTHER): Payer: Medicare Other | Admitting: Endocrinology

## 2013-08-13 VITALS — BP 126/82 | HR 60 | Temp 98.2°F | Ht 65.0 in | Wt 266.0 lb

## 2013-08-13 DIAGNOSIS — E1165 Type 2 diabetes mellitus with hyperglycemia: Principal | ICD-10-CM

## 2013-08-13 DIAGNOSIS — IMO0002 Reserved for concepts with insufficient information to code with codable children: Secondary | ICD-10-CM

## 2013-08-13 DIAGNOSIS — E1129 Type 2 diabetes mellitus with other diabetic kidney complication: Secondary | ICD-10-CM

## 2013-08-13 LAB — HEMOGLOBIN A1C: HEMOGLOBIN A1C: 7.8 % — AB (ref 4.6–6.5)

## 2013-08-13 NOTE — Patient Instructions (Addendum)
Please come back for a follow-up appointment in 3 months.   Please continue the same insulins for now.   blood tests are being requested for you today.  We'll contact you with results.   check your blood sugar 2 times a day.  vary the time of day when you check, between before the 3 meals, and at bedtime.  also check if you have symptoms of your blood sugar being too high or too low.  please keep a record of the readings and bring it to your next appointment here.  please call us sooner if your blood sugar goes below 70, or if it stays over 200.     

## 2013-08-13 NOTE — Telephone Encounter (Signed)
I do not see this on patient's current med list. Should she be taking this?? Please advise .

## 2013-08-13 NOTE — Progress Notes (Signed)
Subjective:    Patient ID: Valerie Stevenson, female    DOB: 1946-02-15, 68 y.o.   MRN: [id number]  HPI Pt returns for f/u of insulin-requiring DM (dx'ed with GDM in 1981, and DM outside of pregnancy in approx 1986; she has mild neuropathy of the lower extremities, and associated retinopathy and renal insufficiency; she has never had severe hypoglycemia or DKA; she takes multiple daily injections; she declines weight-loss surgery).  she brings a record of her cbg's which i have reviewed today.  It varies from 120-200.  There is no trend throughout the day, except it is slightly higher in am.   Past Medical History  Diagnosis Date  . Diabetes mellitus      IDDM;Dr Loanne Drilling  . Toxic multinodular goiter     Dr Loanne Drilling  . OSA (obstructive sleep apnea)     CPAP;Dr Clint Young  . Glaucoma   . Anemia     iron deficinecy  . Asthma   . COPD (chronic obstructive pulmonary disease)   . Depression   . GERD (gastroesophageal reflux disease)   . Hyperlipidemia   . Hypertension   . Hyperthyroidism     in context of nodule; Tapazole therapy since 2007  . Skin cancer     basal cell  . Diverticulosis of colon 2009  . Urticaria, idiopathic   . Renal disease     insufficiency   -- saw Nephrologist  in [place]..hasn't seen one in 2-3 yrs  . Arthritis   . Eczema   . Anxiety   . IBS (irritable bowel syndrome)     Past Surgical History  Procedure Laterality Date  . Total abdominal hysterectomy w/ bilateral salpingoophorectomy      sister with ovarian cancer  . Replacement total knee Left 2003    left ; Dr Rosanna Randy; [place]  . Total knee arthroplasty  10/19/2011    Procedure: TOTAL KNEE ARTHROPLASTY;  Surgeon: Ninetta Lights, MD;  Location: [place] [facility];  Service: Orthopedics;  Laterality: Right;  DR MURPHY WANTS 90 MINUTES FOR THIS CASE   . Colonoscopy  2006    Next Due date 2016    History   Social History  . Marital Status: Married    Spouse Name: N/A    Number of Children: 2  .  Years of Education: N/A   Occupational History  . RN-Retired    Social History Main Topics  . Smoking status: Never Smoker   . Smokeless tobacco: Never Used  . Alcohol Use: No  . Drug Use: No  . Sexual Activity: Not on file   Other Topics Concern  . Not on file   Social History Narrative  . No narrative on file    Current Outpatient Prescriptions on File Prior to Visit  Medication Sig Dispense Refill  . albuterol (PROVENTIL HFA;VENTOLIN HFA) 108 (90 BASE) MCG/ACT inhaler Inhale 2 puffs into the lungs every 4 (four) hours as needed for wheezing.  1 Inhaler  2  . aspirin EC 81 MG tablet Take 81 mg by mouth daily with supper.      . B-D INS SYR ULTRAFINE 1CC/31G 31G X 5/16" 1 ML MISC USE  AS DIRECTED ONCE DAILY  100 each  1  . B-D UF III MINI PEN NEEDLES 31G X 5 MM MISC As directed      . budesonide-formoterol (SYMBICORT) 80-4.5 MCG/ACT inhaler Inhale 2 puffs into the lungs 2 (two) times daily as needed.       Marland Kitchen BYSTOLIC 5  MG tablet TAKE 3 TABLETS DAILY  270 tablet  0  . Cetirizine HCl (ZYRTEC ALLERGY PO) Take 1 tablet by mouth 2 (two) times daily.      . cloNIDine (CATAPRES) 0.1 MG tablet PT NEEDS COMPLETE PHYSICAL AND FASTING LABS.  TAKE 1 TABLET TWICE A DAY.  180 tablet  0  . EPINEPHrine (EPIPEN 2-PAK) 0.3 mg/0.3 mL DEVI Inject 0.3 mLs (0.3 mg total) into the muscle once.  1 Device  11  . Fexofenadine HCl (ALLEGRA PO) Take 1 tablet by mouth 2 (two) times daily.      Marland Kitchen guaiFENesin-codeine 100-10 MG/5ML syrup Take 10 mLs by mouth 3 (three) times daily.  240 mL  0  . insulin aspart (NOVOLOG) 100 UNIT/ML injection 3 times a day (just before each meal) 20-20-25 units.      . insulin glargine (LANTUS) 100 UNIT/ML injection Inject 45 Units into the skin at bedtime.       . Insulin Pen Needle (RELION PEN NEEDLE 31G/8MM) 31G X 8 MM MISC USE AS DIRECTED THREE TIMES DAILY  300 each  2  . LORazepam (ATIVAN) 0.5 MG tablet Take 0.5 mg by mouth at bedtime as needed for anxiety. For sleep      .  losartan (COZAAR) 50 MG tablet Take 100 mg by mouth daily.      . pravastatin (PRAVACHOL) 40 MG tablet TAKE 1 TABLET AT BEDTIME  90 tablet  1  . ranitidine (ZANTAC) 150 MG tablet Take 150 mg by mouth 2 (two) times daily.      Marland Kitchen RELION ULTRA THIN PLUS LANCETS MISC As directed        No current facility-administered medications on file prior to visit.    Allergies  Allergen Reactions  . Angiotensin Receptor Blockers Hives    Hives ??  . Latex     rash  . Penicillins Hives    Tolerates Rocephin 08/22/12 - Thuy  . Shellfish-Derived Products Hives and Rash  . Zestril [Lisinopril] Hives  . Spironolactone     Intermittent renal insufficiency  . Coreg [Carvedilol] Hives  . Morphine And Related Itching    Family History  Problem Relation Age of Onset  . Coronary artery disease Father     no MI  . Stroke Mother     in 38s  . Diabetes Mother   . Ovarian cancer Sister     twin sibling  . Alcohol abuse Maternal Grandfather   . Hypothyroidism Sister     twin  . Goiter Sister     resected  . COPD Father   . Breast cancer Sister   . Irritable bowel syndrome Sister     BP 126/82  Pulse 60  Temp(Src) 98.2 F (36.8 C) (Oral)  Ht 5\' 5"  (1.651 m)  Wt 266 lb (120.657 kg)  BMI 44.26 kg/m2  SpO2 93%  Review of Systems She denies hypoglycemia and weight change.      Objective:   Physical Exam VITAL SIGNS:  See vs page GENERAL: no distress   Lab Results  Component Value Date   HGBA1C 7.8* 08/13/2013      Assessment & Plan:  DM: she needs increased rx This insulin regimen was chosen from multiple options, as it best matches her insulin to her changing requirements throughout the day.  The benefits of glycemic control must be weighed against the risks of hypoglycemia.  Morbid obesity: this complicates the rx of dm, but she declines surgery or this

## 2013-08-13 NOTE — Telephone Encounter (Signed)
OK #[id number], NR

## 2013-08-18 NOTE — Assessment & Plan Note (Signed)
Evaluated by Dr Harold Hedge. Everybody tells her the same things. Tanda Rockers is an option.

## 2013-08-18 NOTE — Assessment & Plan Note (Signed)
Intervals with Symbicort and occasional rescue inhaler seem sufficient now.Discussed triggers, maintenance and rescue therapy.

## 2013-08-18 NOTE — Assessment & Plan Note (Signed)
Encourage weight loss. 

## 2013-08-18 NOTE — Assessment & Plan Note (Signed)
Good compliance and control with CPAP 10 Plan-replacement mask and supplies

## 2013-08-19 ENCOUNTER — Other Ambulatory Visit: Payer: Self-pay | Admitting: Family Medicine

## 2013-08-19 ENCOUNTER — Other Ambulatory Visit: Payer: Self-pay | Admitting: Obstetrics and Gynecology

## 2013-08-19 DIAGNOSIS — R921 Mammographic calcification found on diagnostic imaging of breast: Secondary | ICD-10-CM

## 2013-08-21 ENCOUNTER — Telehealth: Payer: Self-pay | Admitting: Endocrinology

## 2013-08-21 NOTE — Telephone Encounter (Signed)
Blood sugars are out of control and feels it is because of the prednisone she has been put on please advise

## 2013-08-21 NOTE — Telephone Encounter (Signed)
See below. Blood sugars have ranging from 250's - 410.  Please advise, Thanks!

## 2013-08-21 NOTE — Telephone Encounter (Signed)
Please take extra novolog 5 units for blood sugar in the 200's, 10 if in the 300's, and 15 if over 400.  You can do this every 4 hrs. Call 1-2 days of not better.

## 2013-08-21 NOTE — Telephone Encounter (Signed)
Pt advised of new instructions.

## 2013-08-23 ENCOUNTER — Telehealth: Payer: Self-pay

## 2013-08-23 NOTE — Telephone Encounter (Signed)
Relevant patient education assigned to patient using Emmi. ° °

## 2013-08-27 ENCOUNTER — Telehealth: Payer: Self-pay

## 2013-08-27 NOTE — Telephone Encounter (Signed)
Patient informed. 

## 2013-08-27 NOTE — Telephone Encounter (Signed)
Please continue the same insulin for now, as the effect of the prednisone will take a few days to go away.   In 2-3 days, please go back to your previous insulin amount.

## 2013-08-27 NOTE — Telephone Encounter (Signed)
Paientt called with blood sugars readings:  Thursday: 189, 108, and 133 Friday: 141, 213, 240, 304,  Saturday: 158 and 320  Sunday: 158 and 150 Monday: 121, 105 and 204  Tuesday: 140. Pt states she took her last Prednisone pill yesterday and wanted to know if she go back to her regular dosage of insulin. Please advise, Thanks!

## 2013-09-03 ENCOUNTER — Ambulatory Visit
Admission: RE | Admit: 2013-09-03 | Discharge: 2013-09-03 | Disposition: A | Discharge status: Home / Self Care | Payer: Medicare Other | Source: Ambulatory Visit | Attending: Family Medicine | Admitting: Family Medicine

## 2013-09-03 DIAGNOSIS — R921 Mammographic calcification found on diagnostic imaging of breast: Secondary | ICD-10-CM

## 2013-09-06 ENCOUNTER — Other Ambulatory Visit: Payer: Self-pay | Admitting: Endocrinology

## 2013-09-15 ENCOUNTER — Other Ambulatory Visit: Payer: Self-pay | Admitting: Internal Medicine

## 2013-09-22 ENCOUNTER — Other Ambulatory Visit: Payer: Self-pay | Admitting: Internal Medicine

## 2013-10-10 ENCOUNTER — Other Ambulatory Visit: Payer: Self-pay | Admitting: Internal Medicine

## 2013-10-20 ENCOUNTER — Other Ambulatory Visit: Payer: Self-pay | Admitting: Internal Medicine

## 2013-11-05 ENCOUNTER — Telehealth: Payer: Self-pay | Admitting: Endocrinology

## 2013-11-05 MED ORDER — INSULIN ASPART 100 UNIT/ML FLEXPEN: PEN_INJECTOR | SUBCUTANEOUS | Status: DC

## 2013-11-05 NOTE — Telephone Encounter (Signed)
Rx sent to pharmacy. Pt advised via voicemail.  

## 2013-11-05 NOTE — Telephone Encounter (Signed)
[facility] please call pt when complete

## 2013-11-12 ENCOUNTER — Encounter: Payer: Self-pay | Admitting: Endocrinology

## 2013-11-12 ENCOUNTER — Ambulatory Visit (INDEPENDENT_AMBULATORY_CARE_PROVIDER_SITE_OTHER): Payer: Medicare Other | Admitting: Endocrinology

## 2013-11-12 VITALS — BP 122/62 | HR 60 | Temp 97.9°F | Ht 65.0 in | Wt 272.0 lb

## 2013-11-12 DIAGNOSIS — IMO0002 Reserved for concepts with insufficient information to code with codable children: Secondary | ICD-10-CM

## 2013-11-12 DIAGNOSIS — E1165 Type 2 diabetes mellitus with hyperglycemia: Principal | ICD-10-CM

## 2013-11-12 DIAGNOSIS — E1129 Type 2 diabetes mellitus with other diabetic kidney complication: Secondary | ICD-10-CM

## 2013-11-12 LAB — HEMOGLOBIN A1C: Hgb A1c MFr Bld: 7.3 % — ABNORMAL HIGH (ref 4.6–6.5)

## 2013-11-12 NOTE — Patient Instructions (Addendum)
Please come back for a follow-up appointment in 3 months.   Please continue the same insulins for now.   blood tests are being requested for you today.  We'll contact you with results.   check your blood sugar 2 times a day.  vary the time of day when you check, between before the 3 meals, and at bedtime.  also check if you have symptoms of your blood sugar being too high or too low.  please keep a record of the readings and bring it to your next appointment here.  please call us sooner if your blood sugar goes below 70, or if it stays over 200.

## 2013-11-12 NOTE — Progress Notes (Signed)
Subjective:    Patient ID: Valerie Stevenson, female    DOB: June 14, 1945, 68 y.o.   MRN: [id number]  HPI Pt returns for f/u of insulin-requiring DM (dx'ed with GDM in 1981, and DM outside of pregnancy in approx 1986; she has mild neuropathy of the lower extremities, and associated retinopathy and renal insufficiency; she has never had pancreatitis, severe hypoglycemia or DKA; she takes multiple daily injections; she declines weight-loss surgery).  she brings a record of her cbg's which i have reviewed today.  It varies from 58-130.  It is lowest before lunch, and in the afternoon. Past Medical History  Diagnosis Date  . Diabetes mellitus      IDDM;Dr Loanne Drilling  . Toxic multinodular goiter     Dr Loanne Drilling  . OSA (obstructive sleep apnea)     CPAP;Dr Clint Young  . Glaucoma   . Anemia     iron deficinecy  . Asthma   . COPD (chronic obstructive pulmonary disease)   . Depression   . GERD (gastroesophageal reflux disease)   . Hyperlipidemia   . Hypertension   . Hyperthyroidism     in context of nodule; Tapazole therapy since 2007  . Skin cancer     basal cell  . Diverticulosis of colon 2009  . Urticaria, idiopathic   . Renal disease     insufficiency   -- saw Nephrologist  in [place]..hasn't seen one in 2-3 yrs  . Arthritis   . Eczema   . Anxiety   . IBS (irritable bowel syndrome)     Past Surgical History  Procedure Laterality Date  . Total abdominal hysterectomy w/ bilateral salpingoophorectomy      sister with ovarian cancer  . Replacement total knee Left 2003    left ; Dr Rosanna Randy; [place]  . Total knee arthroplasty  10/19/2011    Procedure: TOTAL KNEE ARTHROPLASTY;  Surgeon: Ninetta Lights, MD;  Location: [facility];  Service: Orthopedics;  Laterality: Right;  DR MURPHY WANTS 90 MINUTES FOR THIS CASE   . Colonoscopy  2006    Next Due date 2016    History   Social History  . Marital Status: Married    Spouse Name: N/A    Number of Children: 2  . Years of  Education: N/A   Occupational History  . RN-Retired    Social History Main Topics  . Smoking status: Never Smoker   . Smokeless tobacco: Never Used  . Alcohol Use: No  . Drug Use: No  . Sexual Activity: Not on file   Other Topics Concern  . Not on file   Social History Narrative  . No narrative on file    Current Outpatient Prescriptions on File Prior to Visit  Medication Sig Dispense Refill  . albuterol (PROVENTIL HFA;VENTOLIN HFA) 108 (90 BASE) MCG/ACT inhaler Inhale 2 puffs into the lungs every 4 (four) hours as needed for wheezing.  1 Inhaler  2  . amLODipine (NORVASC) 5 MG tablet TAKE 1 TABLET TWICE A DAY  180 tablet  0  . aspirin EC 81 MG tablet Take 81 mg by mouth daily with supper.      . B-D INS SYR ULTRAFINE 1CC/31G 31G X 5/16" 1 ML MISC USE  AS DIRECTED ONCE DAILY  100 each  1  . B-D UF III MINI PEN NEEDLES 31G X 5 MM MISC As directed      . budesonide-formoterol (SYMBICORT) 80-4.5 MCG/ACT inhaler Inhale 2 puffs into the lungs 2 (two) times  daily as needed.       Marland Kitchen BYSTOLIC 5 MG tablet TAKE 3 TABLETS DAILY  270 tablet  0  . Cetirizine HCl (ZYRTEC ALLERGY PO) Take 1 tablet by mouth 2 (two) times daily.      . cloNIDine (CATAPRES) 0.1 MG tablet PT NEEDS COMPLETE PHYSICAL AND FASTING LABS.  TAKE 1 TABLET TWICE A DAY.  180 tablet  0  . EPINEPHrine (EPIPEN 2-PAK) 0.3 mg/0.3 mL DEVI Inject 0.3 mLs (0.3 mg total) into the muscle once.  1 Device  11  . Fexofenadine HCl (ALLEGRA PO) Take 1 tablet by mouth 2 (two) times daily.      Marland Kitchen guaiFENesin-codeine 100-10 MG/5ML syrup Take 10 mLs by mouth 3 (three) times daily.  240 mL  0  . insulin aspart (NOVOLOG FLEXPEN) 100 UNIT/ML FlexPen INJECT UNDER THE SKIN THREE TIMES A DAY JUST BEFORE EACH MEAL, 30 UNITS BEFORE BREAKFAST, 20 UNITS BEFORE LUNCH AND 35 UNITS BEFORE DINNER  30 pen  0  . insulin aspart (NOVOLOG) 100 UNIT/ML injection 3 times a day (just before each meal) 20-20-25 units.      . insulin glargine (LANTUS) 100 UNIT/ML  injection Inject 45 Units into the skin at bedtime.       . Insulin Pen Needle (RELION PEN NEEDLE 31G/8MM) 31G X 8 MM MISC USE AS DIRECTED THREE TIMES DAILY  300 each  2  . LORazepam (ATIVAN) 0.5 MG tablet Take 0.5 mg by mouth at bedtime as needed for anxiety. For sleep      . losartan (COZAAR) 50 MG tablet Take 100 mg by mouth daily.      . pravastatin (PRAVACHOL) 40 MG tablet TAKE 1 TABLET AT BEDTIME  90 tablet  1  . ranitidine (ZANTAC) 150 MG tablet Take 150 mg by mouth 2 (two) times daily.      Marland Kitchen RELION ULTRA THIN PLUS LANCETS MISC As directed        No current facility-administered medications on file prior to visit.    Allergies  Allergen Reactions  . Angiotensin Receptor Blockers Hives    Hives ??  . Latex     rash  . Penicillins Hives    Tolerates Rocephin 08/22/12 - Thuy  . Shellfish-Derived Products Hives and Rash  . Zestril [Lisinopril] Hives  . Spironolactone     Intermittent renal insufficiency  . Coreg [Carvedilol] Hives  . Morphine And Related Itching    Family History  Problem Relation Age of Onset  . Coronary artery disease Father     no MI  . Stroke Mother     in 19s  . Diabetes Mother   . Ovarian cancer Sister     twin sibling  . Alcohol abuse Maternal Grandfather   . Hypothyroidism Sister     twin  . Goiter Sister     resected  . COPD Father   . Breast cancer Sister   . Irritable bowel syndrome Sister     BP 122/62  Pulse 60  Temp(Src) 97.9 F (36.6 C) (Oral)  Ht 5\' 5"  (1.651 m)  Wt 272 lb (123.378 kg)  BMI 45.26 kg/m2  SpO2 93%    Review of Systems She denies LOC.  She has weight gain.      Objective:   Physical Exam Pulses: dorsalis pedis intact bilat.  Feet: no deformity. normal color and temp. Trace bilat leg edema  Skin: no ulcer on the feet.  Neuro: sensation is intact to touch on the feet ,  but decreased from normal.   Lab Results  Component Value Date   HGBA1C 7.3* 11/12/2013      Assessment & Plan:  DM: mild  exacerbation Morbid obesity: this complicates the rx of DM.  i encouraged surgery, but she declines.  Patient is advised the following: Patient Instructions  Please come back for a follow-up appointment in 3 months.   Please continue the same insulins for now.   blood tests are being requested for you today.  We'll contact you with results.   check your blood sugar 2 times a day.  vary the time of day when you check, between before the 3 meals, and at bedtime.  also check if you have symptoms of your blood sugar being too high or too low.  please keep a record of the readings and bring it to your next appointment here.  please call us sooner if your blood sugar goes below 70, or if it stays over 200.

## 2013-11-13 ENCOUNTER — Encounter: Payer: Self-pay | Admitting: Endocrinology

## 2013-11-13 LAB — MICROALBUMIN / CREATININE URINE RATIO
Creatinine,U: 67.1 mg/dL
MICROALB UR: 190.8 mg/dL — AB (ref 0.0–1.9)
Microalb Creat Ratio: 284.3 mg/g — ABNORMAL HIGH (ref 0.0–30.0)

## 2013-11-14 ENCOUNTER — Other Ambulatory Visit: Payer: Self-pay | Admitting: Endocrinology

## 2013-11-14 ENCOUNTER — Encounter: Payer: Self-pay | Admitting: Endocrinology

## 2013-12-05 ENCOUNTER — Other Ambulatory Visit: Payer: Self-pay | Admitting: Internal Medicine

## 2013-12-13 ENCOUNTER — Other Ambulatory Visit: Payer: Self-pay | Admitting: Endocrinology

## 2013-12-13 DIAGNOSIS — IMO0001 Reserved for inherently not codable concepts without codable children: Secondary | ICD-10-CM

## 2013-12-13 DIAGNOSIS — E1165 Type 2 diabetes mellitus with hyperglycemia: Principal | ICD-10-CM

## 2014-01-15 ENCOUNTER — Other Ambulatory Visit: Payer: Self-pay | Admitting: Endocrinology

## 2014-01-20 ENCOUNTER — Other Ambulatory Visit: Payer: Self-pay

## 2014-01-20 MED ORDER — INSULIN ASPART 100 UNIT/ML ~~LOC~~ SOLN: SUBCUTANEOUS | Status: DC

## 2014-01-23 ENCOUNTER — Other Ambulatory Visit: Payer: Self-pay | Admitting: Endocrinology

## 2014-01-23 MED ORDER — INSULIN ASPART 100 UNIT/ML FLEXPEN: PEN_INJECTOR | SUBCUTANEOUS | Status: DC

## 2014-01-25 ENCOUNTER — Other Ambulatory Visit: Payer: Self-pay | Admitting: Endocrinology

## 2014-02-11 ENCOUNTER — Encounter: Payer: Self-pay | Admitting: Endocrinology

## 2014-02-11 ENCOUNTER — Ambulatory Visit (INDEPENDENT_AMBULATORY_CARE_PROVIDER_SITE_OTHER): Payer: Medicare Other | Admitting: Endocrinology

## 2014-02-11 VITALS — BP 137/54 | HR 54 | Temp 97.9°F | Ht 65.0 in | Wt 277.0 lb

## 2014-02-11 DIAGNOSIS — E1129 Type 2 diabetes mellitus with other diabetic kidney complication: Secondary | ICD-10-CM

## 2014-02-11 DIAGNOSIS — E1165 Type 2 diabetes mellitus with hyperglycemia: Secondary | ICD-10-CM

## 2014-02-11 DIAGNOSIS — E785 Hyperlipidemia, unspecified: Secondary | ICD-10-CM

## 2014-02-11 DIAGNOSIS — IMO0002 Reserved for concepts with insufficient information to code with codable children: Secondary | ICD-10-CM

## 2014-02-11 LAB — LIPID PANEL
Cholesterol: 177 mg/dL (ref 0–200)
HDL: 34.9 mg/dL — ABNORMAL LOW (ref >39.00)
NonHDL: 142.1
Total CHOL/HDL Ratio: 5
Triglycerides: 241 mg/dL — ABNORMAL HIGH (ref 0.0–149.0)
VLDL: 48.2 mg/dL — ABNORMAL HIGH (ref 0.0–40.0)

## 2014-02-11 LAB — HEMOGLOBIN A1C: Hgb A1c MFr Bld: 8.4 % — ABNORMAL HIGH (ref 4.6–6.5)

## 2014-02-11 LAB — LDL CHOLESTEROL, DIRECT: LDL DIRECT: 106.3 mg/dL

## 2014-02-11 NOTE — Progress Notes (Signed)
Subjective:    Patient ID: Valerie Stevenson, female    DOB: May 15, 1945, 68 y.o.   MRN: [id number]  HPI Pt returns for f/u of diabetes mellitus: DM type: Insulin-requiring type 2 Dx'ed: 3710 Complications: poly neuropathy, retinopathy and renal insufficiency Therapy: insulin. GDM: 1981 DKA: never Severe hypoglycemia: never Pancreatitis: never Other: she takes multiple daily injections; she declines weight-loss surgery Interval history: she brings a record of her cbg's which i have reviewed today.  All are in the 100's.  There is no trend throughout the day, but she does not check at hs. She reports numbness of the feet. Past Medical History  Diagnosis Date  . Diabetes mellitus      IDDM;Dr Loanne Drilling  . Toxic multinodular goiter     Dr Loanne Drilling  . OSA (obstructive sleep apnea)     CPAP;Dr Clint Young  . Glaucoma   . Anemia     iron deficinecy  . Asthma   . COPD (chronic obstructive pulmonary disease)   . Depression   . GERD (gastroesophageal reflux disease)   . Hyperlipidemia   . Hypertension   . Hyperthyroidism     in context of nodule; Tapazole therapy since 2007  . Skin cancer     basal cell  . Diverticulosis of colon 2009  . Urticaria, idiopathic   . Renal disease     insufficiency   -- saw Nephrologist  in [place]..hasn't seen one in 2-3 yrs  . Arthritis   . Eczema   . Anxiety   . IBS (irritable bowel syndrome)     Past Surgical History  Procedure Laterality Date  . Total abdominal hysterectomy w/ bilateral salpingoophorectomy      sister with ovarian cancer  . Replacement total knee Left 2003    left ; Dr Rosanna Randy; [place]  . Total knee arthroplasty  10/19/2011    Procedure: TOTAL KNEE ARTHROPLASTY;  Surgeon: Ninetta Lights, MD;  Location: [facility];  Service: Orthopedics;  Laterality: Right;  DR MURPHY WANTS 90 MINUTES FOR THIS CASE   . Colonoscopy  2006    Next Due date 2016    History   Social History  . Marital Status: Married    Spouse Name:  N/A    Number of Children: 2  . Years of Education: N/A   Occupational History  . RN-Retired    Social History Main Topics  . Smoking status: Never Smoker   . Smokeless tobacco: Never Used  . Alcohol Use: No  . Drug Use: No  . Sexual Activity: Not on file   Other Topics Concern  . Not on file   Social History Narrative  . No narrative on file    Current Outpatient Prescriptions on File Prior to Visit  Medication Sig Dispense Refill  . gabapentin (NEURONTIN) 100 MG capsule Take 200 mg by mouth.       Marland Kitchen albuterol (PROVENTIL HFA;VENTOLIN HFA) 108 (90 BASE) MCG/ACT inhaler Inhale 2 puffs into the lungs every 4 (four) hours as needed for wheezing.  1 Inhaler  2  . amLODipine (NORVASC) 5 MG tablet TAKE 1 TABLET TWICE A DAY  180 tablet  0  . aspirin EC 81 MG tablet Take 81 mg by mouth daily with supper.      . B-D INS SYR ULTRAFINE 1CC/31G 31G X 5/16" 1 ML MISC USE AS DIRECTED DAILY  10 each  2  . B-D UF III MINI PEN NEEDLES 31G X 5 MM MISC As directed      .  budesonide-formoterol (SYMBICORT) 80-4.5 MCG/ACT inhaler Inhale 2 puffs into the lungs 2 (two) times daily as needed.       . Cetirizine HCl (ZYRTEC ALLERGY PO) Take 1 tablet by mouth 2 (two) times daily.      . citalopram (CELEXA) 10 MG tablet Take 20 mg by mouth daily.       . cloNIDine (CATAPRES) 0.1 MG tablet PT NEEDS COMPLETE PHYSICAL AND FASTING LABS.  TAKE 1 TABLET TWICE A DAY.  180 tablet  0  . EPINEPHrine (EPIPEN 2-PAK) 0.3 mg/0.3 mL DEVI Inject 0.3 mLs (0.3 mg total) into the muscle once.  1 Device  11  . Fexofenadine HCl (ALLEGRA PO) Take 1 tablet by mouth 2 (two) times daily.      Marland Kitchen guaiFENesin-codeine 100-10 MG/5ML syrup Take 10 mLs by mouth 3 (three) times daily.  240 mL  0  . insulin glargine (LANTUS) 100 UNIT/ML injection Inject 45 Units into the skin at bedtime.       . Insulin Pen Needle (RELION PEN NEEDLE 31G/8MM) 31G X 8 MM MISC USE AS DIRECTED THREE TIMES DAILY  300 each  2  . LORazepam (ATIVAN) 0.5 MG tablet  Take 0.5 mg by mouth at bedtime as needed for anxiety. For sleep      . losartan (COZAAR) 50 MG tablet Take 100 mg by mouth daily.      . pravastatin (PRAVACHOL) 40 MG tablet TAKE 1 TABLET AT BEDTIME  90 tablet  1  . ranitidine (ZANTAC) 150 MG tablet Take 150 mg by mouth 2 (two) times daily.      Marland Kitchen RELION ULTRA THIN PLUS LANCETS MISC As directed        No current facility-administered medications on file prior to visit.    Allergies  Allergen Reactions  . Angiotensin Receptor Blockers Hives    Hives ??  . Latex     rash  . Penicillins Hives    Tolerates Rocephin 08/22/12 - Thuy  . Shellfish-Derived Products Hives and Rash  . Zestril [Lisinopril] Hives  . Spironolactone     Intermittent renal insufficiency  . Coreg [Carvedilol] Hives  . Morphine And Related Itching    Family History  Problem Relation Age of Onset  . Coronary artery disease Father     no MI  . Stroke Mother     in 65s  . Diabetes Mother   . Ovarian cancer Sister     twin sibling  . Alcohol abuse Maternal Grandfather   . Hypothyroidism Sister     twin  . Goiter Sister     resected  . COPD Father   . Breast cancer Sister   . Irritable bowel syndrome Sister     BP 137/54  Pulse 54  Temp(Src) 97.9 F (36.6 C) (Oral)  Ht 5\' 5"  (1.651 m)  Wt 277 lb (125.646 kg)  BMI 46.10 kg/m2  SpO2 95%    Review of Systems She denies hypoglycemia.  She has gained weight.     Objective:   Physical Exam VITAL SIGNS:  See vs page.   GENERAL: no distress. Pulses: dorsalis pedis intact bilat.   Feet: no deformity.  Trace bilat leg edema.   Skin:  no ulcer on the feet.  normal color and temp. Neuro: sensation is intact to touch on the feet, but decreased from normal.    Lab Results  Component Value Date   HGBA1C 8.4* 02/11/2014       Assessment & Plan:  DM: moderate exacerbation  Morbid obesity, worse. Neuropathy, prob due to DM. Dyslipidemia: she needs medication   Patient is advised the  following: Patient Instructions  Please come back for a follow-up appointment in 3 months.   Please continue the same insulins for now.   blood tests are being requested for you today.  We'll contact you with results.   check your blood sugar 2 times a day.  vary the time of day when you check, between before the 3 meals, and at bedtime.  also check if you have symptoms of your blood sugar being too high or too low.  please keep a record of the readings and bring it to your next appointment here.  please call us sooner if your blood sugar goes below 70, or if it stays over 200.   The best treatment for the numbness of the feet, is to keep the blood sugar in a good range.   Please let me know if you reconsider the weight-loss surgery.   increase the novolog to 3 times a day (just before each meal) 20-20-35 units. let me know if you want a prescription for the cholesterol

## 2014-02-11 NOTE — Patient Instructions (Addendum)
Please come back for a follow-up appointment in 3 months.   Please continue the same insulins for now.   blood tests are being requested for you today.  We'll contact you with results.   check your blood sugar 2 times a day.  vary the time of day when you check, between before the 3 meals, and at bedtime.  also check if you have symptoms of your blood sugar being too high or too low.  please keep a record of the readings and bring it to your next appointment here.  please call us sooner if your blood sugar goes below 70, or if it stays over 200.   The best treatment for the numbness of the feet, is to keep the blood sugar in a good range.   Please let me know if you reconsider the weight-loss surgery.

## 2014-05-11 IMAGING — CT CT CHEST W/O CM
2 of 4 series · 15 of 36 positions shown, 18 images · non-contrast
Comparison: 08/19/2012 chest x-ray

CLINICAL DATA: Cough for 1 week.

CT CHEST WITHOUT CONTRAST
TECHNIQUE: Multidetector CT imaging of the chest was performed
following the standard protocol without IV contrast.

[Series 2: routine chest 5.0 st · axial · 0.67mm/px · z∈[-358,-112]mm · 12 of 59 slices shown, 15 images]
[im 5/59  mediastinal]
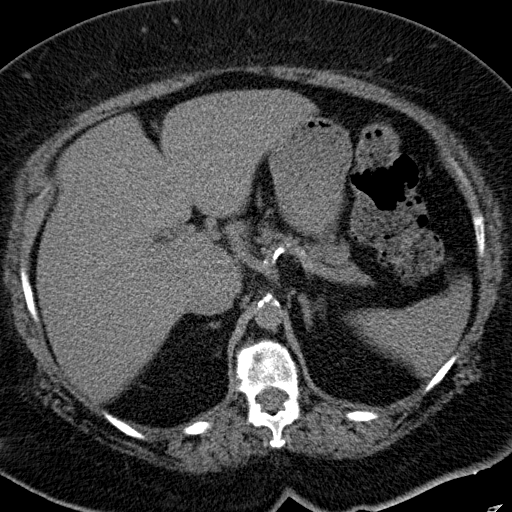
[im 5/59  lung]
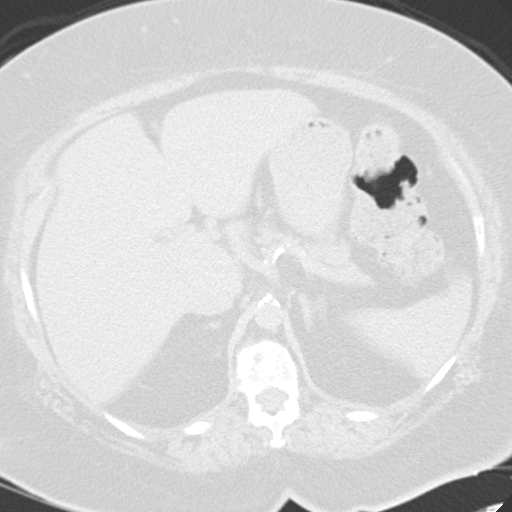
[im 9/59  lung]
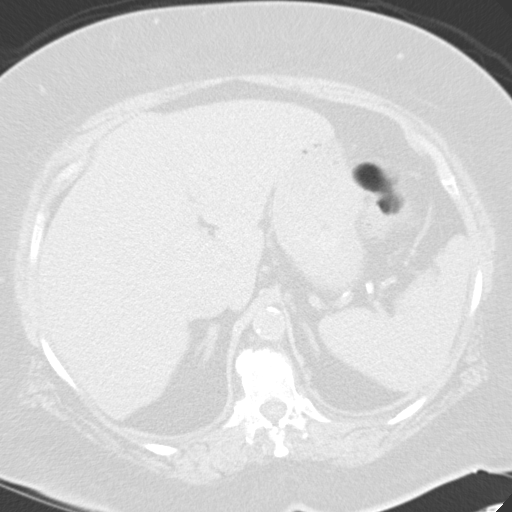
[im 13/59  lung]
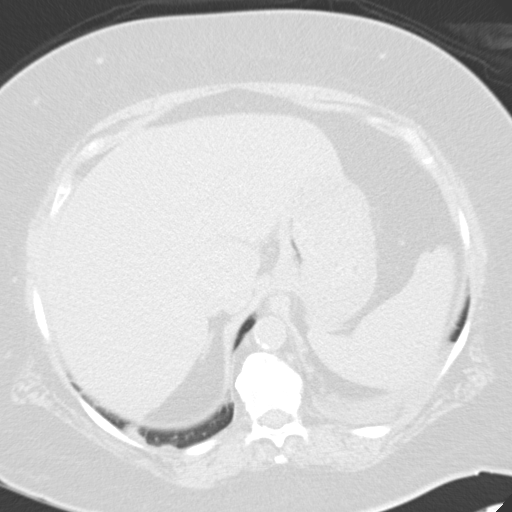
[im 17/59  lung]
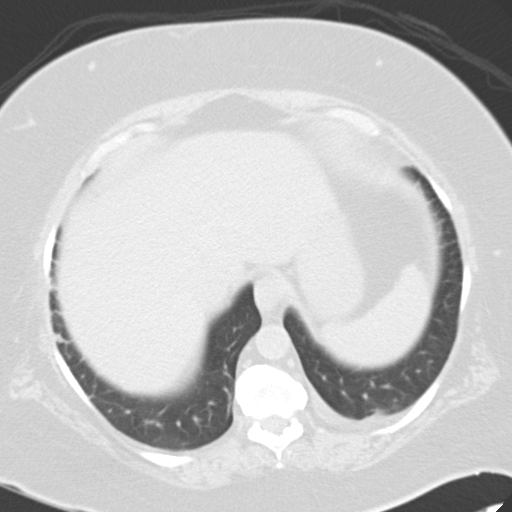
[im 21/59  mediastinal]
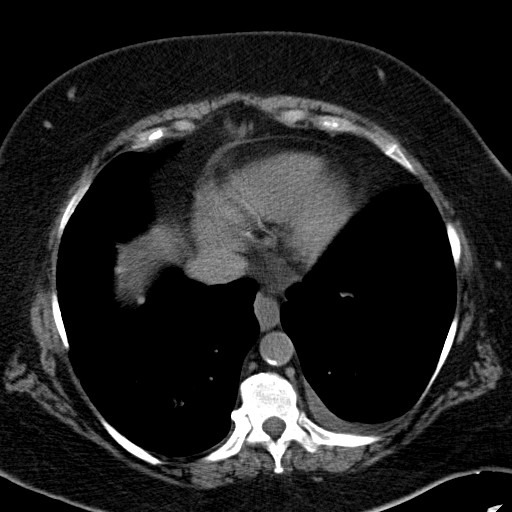
[im 21/59  lung]
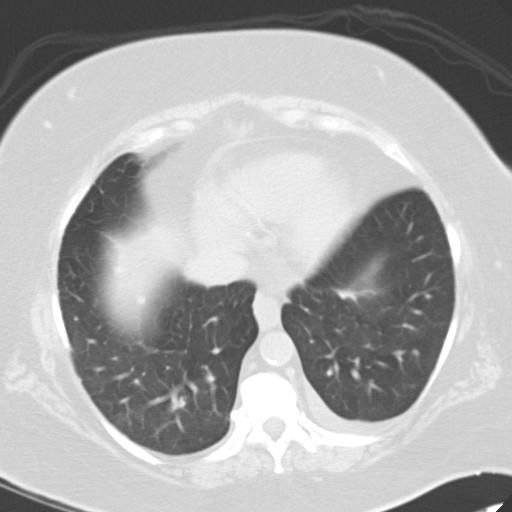
[im 25/59  lung]
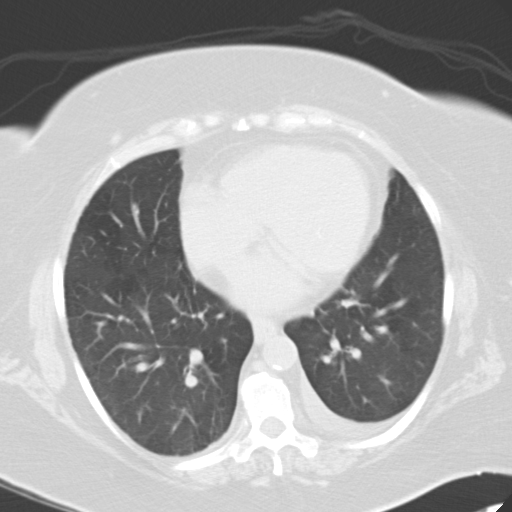
[im 34/59  lung]
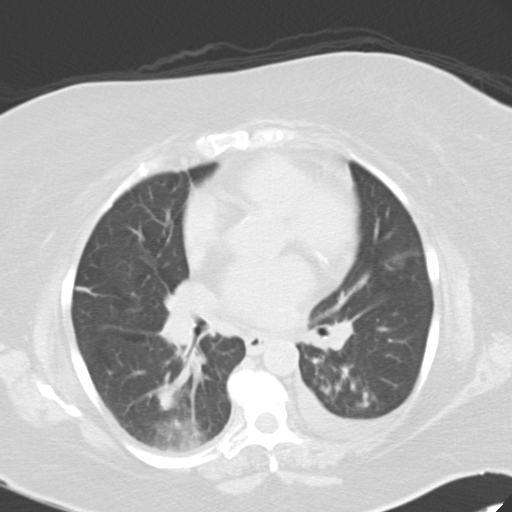
[im 38/59  lung]
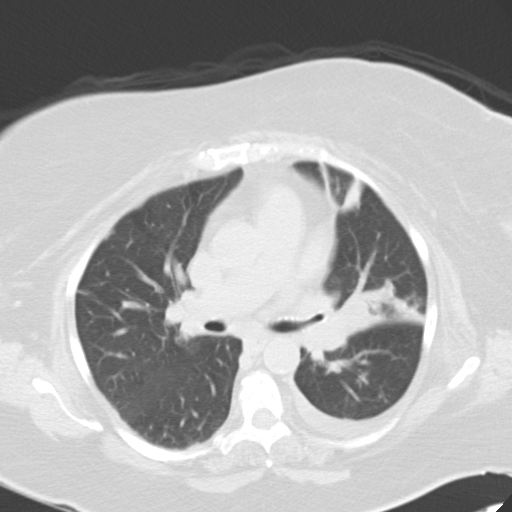
[im 42/59  mediastinal]
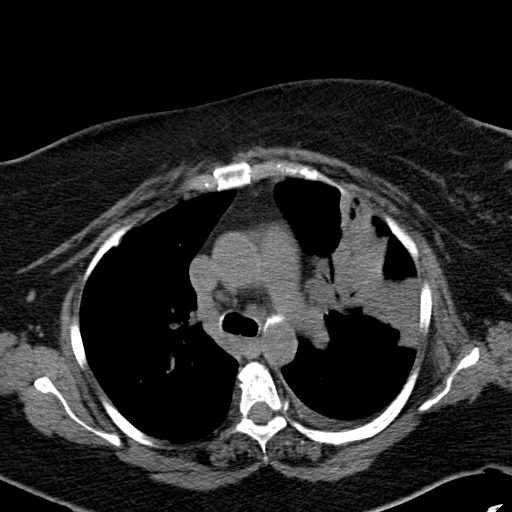
[im 42/59  lung]
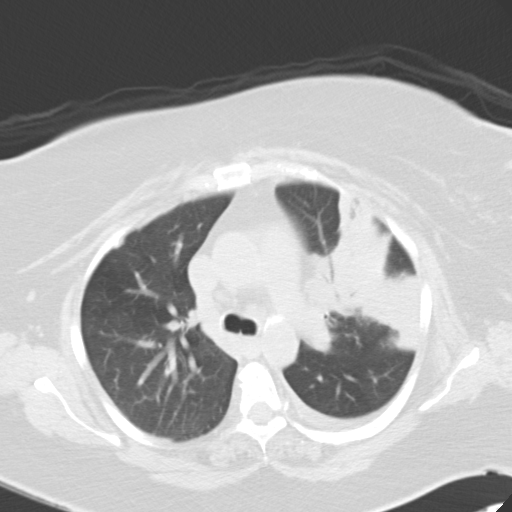
[im 46/59  lung]
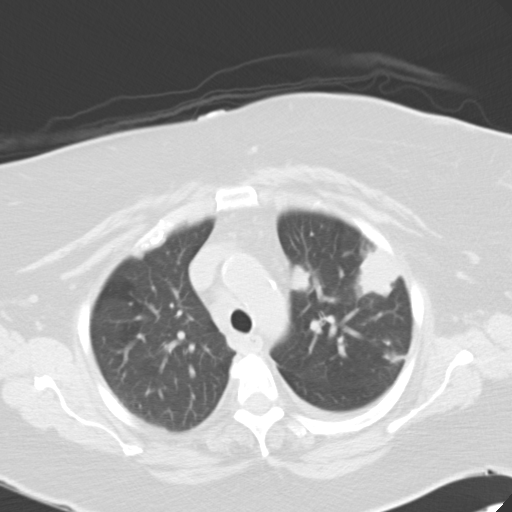
[im 50/59  lung]
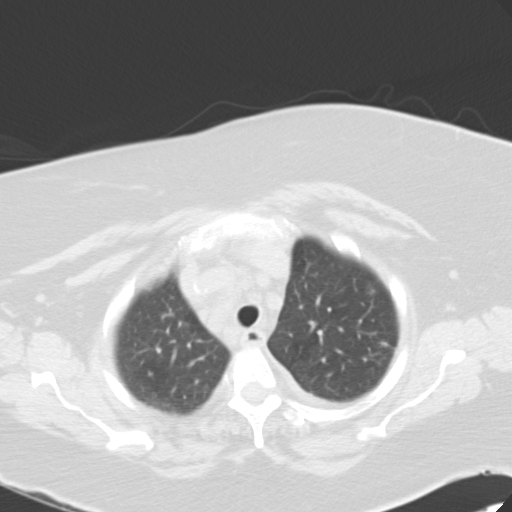
[im 54/59  lung]
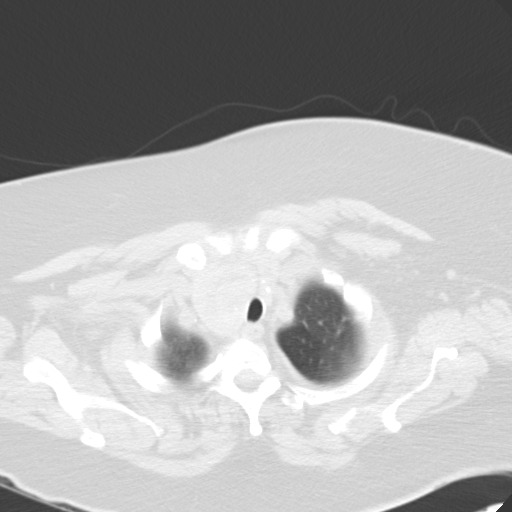

[Series 5: coronals · coronal · 0.61mm/px · 3 of 123 slices shown]
[im 25/123  lung]
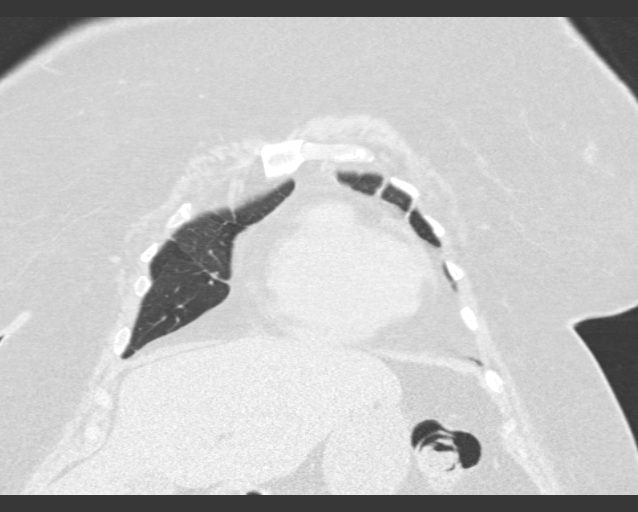
[im 49/123  lung]
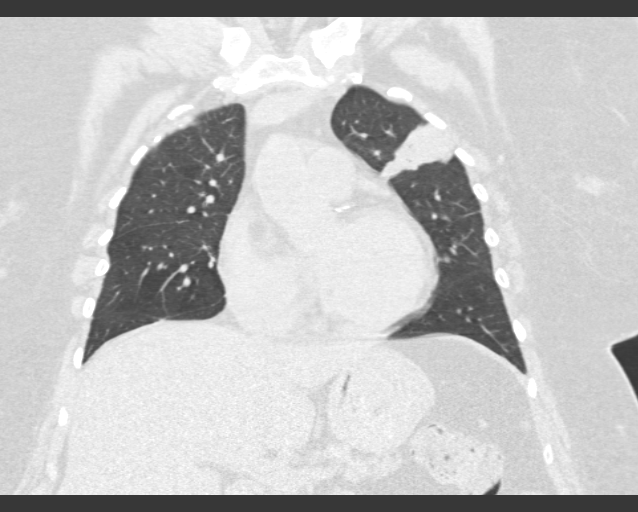
[im 74/123  lung]
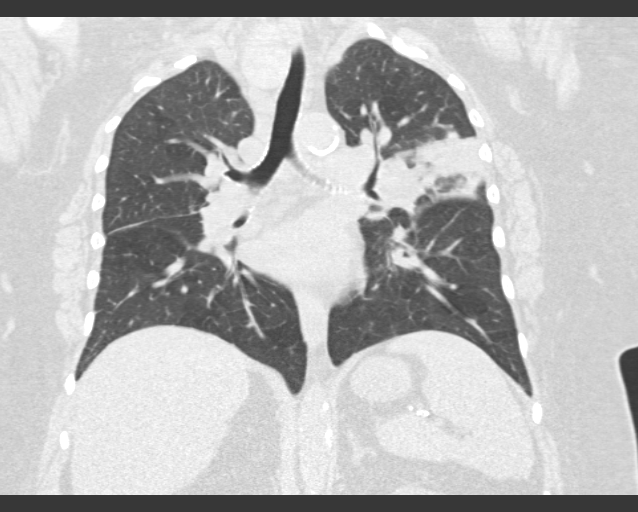

[15 of 36 positions shown; findings below may reference images not displayed]

FINDINGS: Thyroid gland is diffusely heterogeneous with asymmetric
enlargement of the right lobe.  This is been evaluated previously
with multiple nuclear medicine studies performed and 0540.

No axillary lymphadenopathy.  There is mediastinal lymphadenopathy
with a 11 mm short-axis right paratracheal lymph node and a 10 mm
short-axis AP window lymph node.  10 mm short-axis subcarinal lymph
node is evident.

The heart is mildly enlarged.  No pericardial effusion.  Tiny left
pleural effusion noted.

Lung windows demonstrate focal airspace consolidation in the left
upper lobe, following a segmental distribution.  There is focal
airspace consolidation in the posterior right lower lobe with
patchy areas of central airspace disease in the left lower lobe.
Apparent small pleural plaque in the anterior right hemithorax
contains calcification.

Bone windows reveal no worrisome lytic or sclerotic osseous
lesions.
IMPRESSION: Multifocal airspace consolidation to suggest pneumonia although
neoplasm could have this appearance.

Small left pleural effusion.

## 2014-05-13 ENCOUNTER — Ambulatory Visit: Payer: Medicare Other | Admitting: Endocrinology

## 2014-07-24 ENCOUNTER — Ambulatory Visit: Payer: Medicare Other | Admitting: Internal Medicine

## 2014-07-27 ENCOUNTER — Encounter: Payer: Self-pay | Admitting: Internal Medicine

## 2014-08-12 ENCOUNTER — Other Ambulatory Visit: Payer: Self-pay | Admitting: Family Medicine

## 2014-08-12 ENCOUNTER — Encounter: Payer: Self-pay | Admitting: Internal Medicine

## 2014-08-12 DIAGNOSIS — R921 Mammographic calcification found on diagnostic imaging of breast: Secondary | ICD-10-CM

## 2014-08-14 ENCOUNTER — Encounter: Payer: Self-pay | Admitting: Orthopedic Surgery

## 2014-08-14 NOTE — Telephone Encounter (Signed)
NO NOTE

## 2014-08-21 NOTE — Telephone Encounter (Signed)
This encounter was created in error - please disregard.

## 2014-09-15 ENCOUNTER — Ambulatory Visit
Admission: RE | Admit: 2014-09-15 | Discharge: 2014-09-15 | Disposition: A | Discharge status: Home / Self Care | Payer: Medicare Other | Source: Ambulatory Visit | Attending: Family Medicine | Admitting: Family Medicine

## 2014-09-15 DIAGNOSIS — R921 Mammographic calcification found on diagnostic imaging of breast: Secondary | ICD-10-CM

## 2014-09-19 ENCOUNTER — Ambulatory Visit (AMBULATORY_SURGERY_CENTER): Payer: Self-pay | Admitting: *Deleted

## 2014-09-19 VITALS — Ht 66.0 in | Wt 261.8 lb

## 2014-09-19 DIAGNOSIS — Z1211 Encounter for screening for malignant neoplasm of colon: Secondary | ICD-10-CM

## 2014-09-19 MED ORDER — MOVIPREP 100 G PO SOLR
ORAL | Status: DC
Start: 2014-09-19 — End: 2014-10-14

## 2014-09-19 NOTE — Progress Notes (Signed)
No allergies to eggs or soy. No problems with anesthesia.  Pt given Emmi instructions for colonoscopy  No oxygen use  No diet drug use  

## 2014-10-14 ENCOUNTER — Ambulatory Visit (AMBULATORY_SURGERY_CENTER): Payer: Medicare Other | Admitting: Internal Medicine

## 2014-10-14 ENCOUNTER — Encounter: Payer: Self-pay | Admitting: Internal Medicine

## 2014-10-14 VITALS — BP 154/101 | HR 70 | Temp 97.1°F | Resp 19 | Ht 66.0 in | Wt 261.0 lb

## 2014-10-14 DIAGNOSIS — Z1211 Encounter for screening for malignant neoplasm of colon: Secondary | ICD-10-CM | POA: Diagnosis not present

## 2014-10-14 DIAGNOSIS — D124 Benign neoplasm of descending colon: Secondary | ICD-10-CM

## 2014-10-14 LAB — GLUCOSE, CAPILLARY
GLUCOSE-CAPILLARY: 132 mg/dL — AB (ref 65–99)
GLUCOSE-CAPILLARY: 124 mg/dL — AB (ref 65–99)

## 2014-10-14 MED ORDER — SODIUM CHLORIDE 0.9 % IV SOLN: 500.0000 mL | INTRAVENOUS | Status: DC

## 2014-10-14 NOTE — Progress Notes (Signed)
Report to PACU, RN, vss, BBS= Clear.  

## 2014-10-14 NOTE — Op Note (Signed)
[facility]  [address] [place], [address]   COLONOSCOPY PROCEDURE REPORT  PATIENT: Valerie, Stevenson  MR#: [id number] BIRTHDATE: 03-14-1946 , 68  yrs. old GENDER: female ENDOSCOPIST: Eustace Quail, MD REFERRED WO:[id number] Recall, PROCEDURE DATE:  10/14/2014 PROCEDURE:   Colonoscopy, screening and Colonoscopy with snare polypectomy x 2 First Screening Colonoscopy - Avg.  risk and is 50 yrs.  old or older - No.  Prior Negative Screening - Now for repeat screening. 10 or more years since last screening  History of Adenoma - Now for follow-up colonoscopy & has been > or = to 3 yrs.  N/A  Polyps removed today? Yes ASA CLASS:   Class III INDICATIONS:Screening for colonic neoplasia. Index exam 2006 without polyps (in [place]). MEDICATIONS: Propofol 400 mg IV, Lidocaine 200 mg IV, and Monitored anesthesia care  DESCRIPTION OF PROCEDURE:   After the risks benefits and alternatives of the procedure were thoroughly explained, informed consent was obtained.  The digital rectal exam revealed no abnormalities of the rectum.   The [id number]  endoscope was introduced through the anus and advanced to the cecum, which was identified by both the appendix and ileocecal valve. No adverse events experienced.   The quality of the prep was (MoviPrep was used) good.  The instrument was then slowly withdrawn as the colon was fully examined. Estimated blood loss is zero unless otherwise noted in this procedure report.  COLON FINDINGS: Extremely redundant colon and body habitus making cecal intubation difficult, but achievedTwo polyps ranging between 3-82mm in size were found in the descending colon.  A polypectomy was performed with a cold snare.  The resection was complete, the polyp tissue was completely retrieved and sent to histology. There was moderate diverticulosis noted in the sigmoid colon.   The examination was otherwise normal.  Retroflexed views  revealed internal hemorrhoids. The time to cecum = 14.6 Withdrawal time = 17.0   The scope was withdrawn and the procedure completed. COMPLICATIONS: There were no immediate complications.  ENDOSCOPIC IMPRESSION: 1.   Two polyps were found in the descending colon; polypectomy was performed with a cold snare 2.   Moderate diverticulosis was noted in the sigmoid colon 3.   The examination was otherwise normal  RECOMMENDATIONS: 1. Repeat colonoscopy in 5 years if polyp adenomatous and medically fit; otherwise prn  eSigned:  Eustace Quail, MD 10/14/2014 11:41 AM   cc: Elenor Quinones, MD and The Patient

## 2014-10-14 NOTE — Patient Instructions (Signed)
YOU HAD AN ENDOSCOPIC PROCEDURE TODAY AT [facility]:   Refer to the procedure report that was given to you for any specific questions about what was found during the examination.  If the procedure report does not answer your questions, please call your gastroenterologist to clarify.  If you requested that your care partner not be given the details of your procedure findings, then the procedure report has been included in a sealed envelope for you to review at your convenience later.  YOU SHOULD EXPECT: Some feelings of bloating in the abdomen. Passage of more gas than usual.  Walking can help get rid of the air that was put into your GI tract during the procedure and reduce the bloating. If you had a lower endoscopy (such as a colonoscopy or flexible sigmoidoscopy) you may notice spotting of blood in your stool or on the toilet paper. If you underwent a bowel prep for your procedure, you may not have a normal bowel movement for a few days.  Please Note:  You might notice some irritation and congestion in your nose or some drainage.  This is from the oxygen used during your procedure.  There is no need for concern and it should clear up in a day or so.  SYMPTOMS TO REPORT IMMEDIATELY:   Following lower endoscopy (colonoscopy or flexible sigmoidoscopy):  Excessive amounts of blood in the stool  Significant tenderness or worsening of abdominal pains  Swelling of the abdomen that is new, acute  Fever of 100F or higher   For urgent or emergent issues, a gastroenterologist can be reached at any hour by calling [phone].   DIET: Your first meal following the procedure should be a small meal and then it is ok to progress to your normal diet. Heavy or fried foods are harder to digest and may make you feel nauseous or bloated.  Likewise, meals heavy in dairy and vegetables can increase bloating.  Drink plenty of fluids but you should avoid alcoholic beverages for 24  hours.  ACTIVITY:  You should plan to take it easy for the rest of today and you should NOT DRIVE or use heavy machinery until tomorrow (because of the sedation medicines used during the test).    FOLLOW UP: Our staff will call the number listed on your records the next business day following your procedure to check on you and address any questions or concerns that you may have regarding the information given to you following your procedure. If we do not reach you, we will leave a message.  However, if you are feeling well and you are not experiencing any problems, there is no need to return our call.  We will assume that you have returned to your regular daily activities without incident.  If any biopsies were taken you will be contacted by phone or by letter within the next 1-3 weeks.  Please call us at [phone] if you have not heard about the biopsies in 3 weeks.    SIGNATURES/CONFIDENTIALITY: You and/or your care partner have signed paperwork which will be entered into your electronic medical record.  These signatures attest to the fact that that the information above on your After Visit Summary has been reviewed and is understood.  Full responsibility of the confidentiality of this discharge information lies with you and/or your care -partner.  Discharge instructions given Polyps/Diverticulosis handout given Repeat Colonoscopy in 5 years

## 2014-10-14 NOTE — Progress Notes (Signed)
Called to room to assist during endoscopic procedure.  Patient ID and intended procedure confirmed with present staff. Received instructions for my participation in the procedure from the performing physician.  

## 2014-10-15 ENCOUNTER — Telehealth: Payer: Self-pay

## 2014-10-15 NOTE — Telephone Encounter (Signed)
  Follow up Call-  Call back number 10/14/2014  Post procedure Call Back phone  # [phone]  Permission to leave phone message Yes     Patient questions:  Do you have a fever, pain , or abdominal swelling? No. Pain Score  0 *  Have you tolerated food without any problems? Yes.    Have you been able to return to your normal activities? Yes.    Do you have any questions about your discharge instructions: Diet   No. Medications  No. Follow up visit  No.  Do you have questions or concerns about your Care? No.  Actions: * If pain score is 4 or above: No action needed, pain <4.  No problems per the pt. maw

## 2014-10-20 ENCOUNTER — Encounter: Payer: Self-pay | Admitting: Internal Medicine

## 2014-12-11 ENCOUNTER — Other Ambulatory Visit: Payer: Self-pay | Admitting: Endocrinology

## 2014-12-11 NOTE — Telephone Encounter (Signed)
Please refill x 3 mos Ov is due 

## 2014-12-11 NOTE — Telephone Encounter (Signed)
Please advise if ok to refill. Last office visit was October of 2015. Thanks!

## 2019-06-01 ENCOUNTER — Ambulatory Visit: Payer: Medicare Other

## 2019-09-12 ENCOUNTER — Encounter: Payer: Self-pay | Admitting: Gastroenterology

## 2019-10-31 ENCOUNTER — Encounter: Payer: Self-pay | Admitting: Internal Medicine

## 2019-12-05 ENCOUNTER — Telehealth: Payer: Self-pay | Admitting: Internal Medicine

## 2019-12-05 NOTE — Telephone Encounter (Signed)
Pt states she is on Xarelto- for chronic A Fib-  She was started on this after last 2016 Colon-  I explained to her why she needs to have an OV- she asked if we can just send a call to Cardiology and ask to hold- explained to her we cannot do that, she has to actually be seem- Schedule to see J Zehr 9-22 at 130 pm- I canceled  her 8-26 PV and her 9-13 Colon  - explained she will have her procedure RS once she has her OV- she verbalized understanding   Lelan Pons PV

## 2020-01-06 ENCOUNTER — Encounter: Payer: Medicare Other | Admitting: Internal Medicine

## 2020-01-15 ENCOUNTER — Encounter: Payer: Self-pay | Admitting: Gastroenterology

## 2020-01-15 ENCOUNTER — Ambulatory Visit (INDEPENDENT_AMBULATORY_CARE_PROVIDER_SITE_OTHER): Payer: Medicare Other | Admitting: Gastroenterology

## 2020-01-15 VITALS — BP 120/80 | HR 88 | Ht 66.0 in | Wt 239.0 lb

## 2020-01-15 DIAGNOSIS — Z1211 Encounter for screening for malignant neoplasm of colon: Secondary | ICD-10-CM | POA: Diagnosis not present

## 2020-01-15 DIAGNOSIS — D126 Benign neoplasm of colon, unspecified: Secondary | ICD-10-CM | POA: Diagnosis not present

## 2020-01-15 DIAGNOSIS — I509 Heart failure, unspecified: Secondary | ICD-10-CM | POA: Diagnosis not present

## 2020-01-15 DIAGNOSIS — I4819 Other persistent atrial fibrillation: Secondary | ICD-10-CM | POA: Diagnosis not present

## 2020-01-15 NOTE — Patient Instructions (Addendum)
If you are age 75 or older, your body mass index should be between 23-30. Your Body mass index is 38.58 kg/m. If this is out of the aforementioned range listed, please consider follow up with your Primary Care Provider.  If you are age 93 or younger, your body mass index should be between 19-25. Your Body mass index is 38.58 kg/m. If this is out of the aformentioned range listed, please consider follow up with your Primary Care Provider.   You have been scheduled for a Virtual CT Colonoscopy scan at [facility] at [address] , 1st floor. You are scheduled on 01/31/20  at 9:30 am. You should arrive 30 minutes prior to your appointment time for registration.  Please pick up 2 bottles of contrast from [facility] at least 4 days prior to your scan. The solution may taste better if refrigerated, but do NOT add ice or any other liquid to this solution. Shake well before drinking.   They will go over directions with you when you go to pick up your prep.  The purpose of you drinking the oral contrast is to aid in the visualization of your intestinal tract. The contrast solution may cause some diarrhea. Depending on your individual set of symptoms, you may also receive an intravenous injection of x-ray contrast/dye. Plan on being at [facility] for 45 minutes or longer, depending on the type of exam you are having performed.   If you have any questions regarding your exam or if you need to reschedule, you may call [facility] at [phone] between the hours of 8:00 am and 5:00 pm, Monday-Friday.   Follow up pending the results of your CT.

## 2020-01-15 NOTE — Progress Notes (Signed)
01/15/2020 Valerie Stevenson [id number] [birth year]   HISTORY OF PRESENT ILLNESS: This is a 74 year old female who is a patient of Dr. Blanch Media.  She was brought here today to discuss scheduling colonoscopy in the setting of Xarelto for chronic anticoagulation.  Her last colonoscopy was in June 2016 at which time she was found to have diverticulosis and 2 polyps that were removed, tubular adenomas.  Since then she has developed atrial fibrillation, which is now persistent.  She also has diastolic CHF with ejection fraction of 50 to 55%, hypertension, history of small CVA in the past, chronic kidney disease stage III, COPD, obstructive sleep apnea on CPAP, hyperlipidemia, and IDDM.  She denies any major complaints.  She says that some of her medications cause constipation.  She uses MiraLAX on occasion and knows that she should be using it more regularly.  She denies any rectal bleeding.   Past Medical History:  Diagnosis Date  . Anemia    iron deficinecy  . Anxiety   . Arthritis   . Asthma   . COPD (chronic obstructive pulmonary disease) ([place])   . Depression   . Diabetes mellitus     IDDM;Dr Loanne Drilling  . Diverticulosis of colon 2009  . Eczema   . GERD (gastroesophageal reflux disease)   . Glaucoma   . Hyperlipidemia   . Hypertension   . Hyperthyroidism    in context of nodule; Tapazole therapy since 2007  . IBS (irritable bowel syndrome)   . OSA (obstructive sleep apnea)    CPAP;Dr Clint Young  . Renal disease    insufficiency   -- saw Nephrologist  in [place]..hasn't seen one in 2-3 yrs  . Skin cancer    basal cell  . Sleep apnea   . Toxic multinodular goiter    Dr Loanne Drilling  . Urticaria, idiopathic    Past Surgical History:  Procedure Laterality Date  . COLONOSCOPY  2006   Next Due date 2016  . REPLACEMENT TOTAL KNEE Left 2003   left ; Dr Rosanna Randy; [place]  . TOTAL ABDOMINAL HYSTERECTOMY W/ BILATERAL SALPINGOOPHORECTOMY  2007   sister with ovarian cancer  .  TOTAL KNEE ARTHROPLASTY  10/19/2011   Procedure: TOTAL KNEE ARTHROPLASTY;  Surgeon: Ninetta Lights, MD;  Location: [facility];  Service: Orthopedics;  Laterality: Right;  DR MURPHY WANTS 90 MINUTES FOR THIS CASE     reports that she has never smoked. She has never used smokeless tobacco. She reports that she does not drink alcohol and does not use drugs. family history includes Alcohol abuse in her maternal grandfather; Breast cancer in her sister; COPD in her father; Coronary artery disease in her father; Diabetes in her mother; Goiter in her sister; Hypothyroidism in her sister; Irritable bowel syndrome in her sister; Ovarian cancer in her sister; Stroke in her mother. Allergies  Allergen Reactions  . Angiotensin Receptor Blockers Hives    Hives ??  . Latex     rash  . Penicillins Hives    Tolerates Rocephin 08/22/12 - [place]  . Shellfish-Derived Products Hives and Rash  . Zestril [Lisinopril] Hives  . Ace Inhibitors Hives  . Spironolactone     Intermittent renal insufficiency  . Coreg [Carvedilol] Hives  . Morphine And Related Itching      Outpatient Encounter Medications as of 01/15/2020  Medication Sig  . albuterol (PROVENTIL HFA;VENTOLIN HFA) 108 (90 BASE) MCG/ACT inhaler Inhale 2 puffs into the lungs every 4 (four) hours as needed for wheezing.  Marland Kitchen  amLODipine (NORVASC) 5 MG tablet Take 1 tablet by mouth in the morning and at bedtime.  Marland Kitchen aspirin EC 81 MG tablet Take 81 mg by mouth daily with supper.  . B-D INS SYR ULTRAFINE 1CC/31G 31G X 5/16" 1 ML MISC USE AS DIRECTED DAILY  . B-D UF III MINI PEN NEEDLES 31G X 5 MM MISC As directed  . Calcium Carb-Cholecalciferol 600-800 MG-UNIT TABS Take by mouth daily.  . cloNIDine (CATAPRES) 0.1 MG tablet PT NEEDS COMPLETE PHYSICAL AND FASTING LABS.  TAKE 1 TABLET TWICE A DAY.  Marland Kitchen diltiazem (TIAZAC) 180 MG 24 hr capsule Take 1 capsule by mouth in the morning and at bedtime.  [name] Calcium (STOOL SOFTENER PO) Take by mouth at bedtime.  Marland Kitchen  EPINEPHrine (EPIPEN 2-PAK) 0.3 mg/0.3 mL DEVI Inject 0.3 mLs (0.3 mg total) into the muscle once.  . Exenatide ER 2 MG PEN Inject 2 mg into the skin once a week.  . famotidine (PEPCID) 20 MG tablet Take 20 mg by mouth 2 (two) times daily.  . Fluticasone-Salmeterol (ADVAIR HFA IN) Inhale 2 puffs into the lungs 2 (two) times daily.  . furosemide (LASIX) 40 MG tablet Take 1 tablet by mouth daily.  . insulin aspart (NOVOLOG) 100 UNIT/ML FlexPen 3 times a day (just before each meal) 20-20-35 units  . insulin glargine (LANTUS) 100 UNIT/ML injection Inject 55 Units into the skin at bedtime.   . Insulin Pen Needle (RELION PEN NEEDLE 31G/8MM) 31G X 8 MM MISC USE AS DIRECTED THREE TIMES DAILY  . metoprolol succinate (TOPROL-XL) 100 MG 24 hr tablet Take 1 tablet by mouth in the morning and at bedtime.  . montelukast (SINGULAIR) 10 MG tablet Take 1 tablet by mouth at bedtime.  . nortriptyline (PAMELOR) 10 MG capsule Take 3 capsules by mouth at bedtime.  Marland Kitchen NOVOLOG FLEXPEN 100 UNIT/ML FlexPen INJECT THREE TIMES A DAY (JUST BEFORE EACH MEAL) 15-15-30 UNITS  . olmesartan (BENICAR) 40 MG tablet Take 1 tablet by mouth daily.  . Pediatric Multivitamins-Iron (CEROVITE JR) 18 MG CHEW Chew 1 tablet by mouth daily.  . Polyethylene Glycol 3350 (MIRALAX PO) Take by mouth daily.  . pravastatin (PRAVACHOL) 40 MG tablet TAKE 1 TABLET AT BEDTIME  . RELION ULTRA THIN PLUS LANCETS MISC As directed   . Rivaroxaban (XARELTO) 15 MG TABS tablet Take 1 tablet by mouth daily.  . sertraline (ZOLOFT) 50 MG tablet Take 1 tablet by mouth daily.  Marland Kitchen triamcinolone cream (KENALOG) 0.1 % Apply topically as needed.  . [DISCONTINUED] buPROPion (WELLBUTRIN XL) 300 MG 24 hr tablet Take 300 mg by mouth daily.  . [DISCONTINUED] hydrochlorothiazide (HYDRODIURIL) 25 MG tablet Take 25 mg by mouth daily.  . [DISCONTINUED] ibuprofen (ADVIL,MOTRIN) 400 MG tablet Take 400 mg by mouth 2 (two) times daily.  . [DISCONTINUED] labetalol (NORMODYNE) 200 MG  tablet Take 200 mg by mouth 2 (two) times daily.  . [DISCONTINUED] LORazepam (ATIVAN) 0.5 MG tablet Take 0.5 mg by mouth at bedtime as needed for anxiety. For sleep  . [DISCONTINUED] losartan (COZAAR) 50 MG tablet Take 100 mg by mouth daily.  . [DISCONTINUED] ranitidine (ZANTAC) 150 MG tablet Take 150 mg by mouth 2 (two) times daily.  . [DISCONTINUED] Wheat Dextrin (BENEFIBER PO) Take by mouth daily.   No facility-administered encounter medications on file as of 01/15/2020.     REVIEW OF SYSTEMS  : All other systems reviewed and negative except where noted in the History of Present Illness.   PHYSICAL EXAM: BP 120/80 (  Cuff Size: Large)   Pulse 88   Ht 5\' 6"  (1.676 m)   Wt 239 lb (108.4 kg)   BMI 38.58 kg/m  General: Well developed white female in no acute distress Head: Normocephalic and atraumatic Eyes:  Sclerae anicteric, conjunctiva pink. Ears: Normal auditory acuity Lungs: Clear throughout to auscultation; no W/R/R. Heart: Irregularly irregular. Abdomen: Soft, non-distended.  BS present.  Non-tender. Musculoskeletal: Symmetrical with no gross deformities  Skin: No lesions on visible extremities Extremities: No edema  Neurological: Alert oriented x 4, grossly non-focal Psychological:  Alert and cooperative. Normal mood and affect  ASSESSMENT AND PLAN:  *Personal history of adenomatous colon polyps:  Last colonoscopy 09/2014.  Since then she has developed atrial fibrillation, which is now persistent.  Is on Xarelto.  Also has diastolic CHF, ejection fraction 50 to 55%.  We discussed that she is definitely higher risk colonoscopy.  We discussed other options including virtual CT colonoscopy or possibly even no screening at all.  She would like to proceed with virtual CT colonoscopy if it will be approved by insurance.  She is aware that if it indicated any issues then colonoscopy would be recommended to evaluate further.  CC:  Ryter-Brown, Shyrl Numbers, *

## 2020-01-15 NOTE — Progress Notes (Signed)
Agree with NOT perusing colonoscopy in this high risk patient with minimal findings on last exam

## 2020-01-31 ENCOUNTER — Ambulatory Visit
Admission: RE | Admit: 2020-01-31 | Discharge: 2020-01-31 | Disposition: A | Discharge status: Home / Self Care | Payer: Medicare Other | Source: Ambulatory Visit | Attending: Gastroenterology | Admitting: Gastroenterology

## 2020-01-31 DIAGNOSIS — I509 Heart failure, unspecified: Secondary | ICD-10-CM

## 2020-01-31 DIAGNOSIS — I4819 Other persistent atrial fibrillation: Secondary | ICD-10-CM

## 2020-01-31 DIAGNOSIS — Z1211 Encounter for screening for malignant neoplasm of colon: Secondary | ICD-10-CM

## 2020-01-31 DIAGNOSIS — D126 Benign neoplasm of colon, unspecified: Secondary | ICD-10-CM

## 2020-02-03 ENCOUNTER — Telehealth: Payer: Self-pay | Admitting: Gastroenterology

## 2020-02-03 NOTE — Telephone Encounter (Signed)
Pt Is returning a missed call from [facility] regarding her CT results.

## 2020-02-03 NOTE — Telephone Encounter (Signed)
Spoke with pt, see result note.  

## 2023-05-27 DEATH — deceased
